# Patient Record
Sex: Male | Born: 1935 | Race: Black or African American | Hispanic: No | Marital: Married | Smoking: Former smoker
Health system: Southern US, Community
[De-identification: ages and names within clinical notes are randomized; demographics above are authoritative.]

## PROBLEM LIST (undated history)

## (undated) DIAGNOSIS — G959 Disease of spinal cord, unspecified: Secondary | ICD-10-CM

## (undated) DIAGNOSIS — R001 Bradycardia, unspecified: Secondary | ICD-10-CM

## (undated) DIAGNOSIS — R55 Syncope and collapse: Secondary | ICD-10-CM

## (undated) DIAGNOSIS — E785 Hyperlipidemia, unspecified: Secondary | ICD-10-CM

## (undated) DIAGNOSIS — D649 Anemia, unspecified: Secondary | ICD-10-CM

## (undated) DIAGNOSIS — D469 Myelodysplastic syndrome, unspecified: Secondary | ICD-10-CM

## (undated) DIAGNOSIS — E039 Hypothyroidism, unspecified: Secondary | ICD-10-CM

## (undated) DIAGNOSIS — G4733 Obstructive sleep apnea (adult) (pediatric): Secondary | ICD-10-CM

## (undated) DIAGNOSIS — J449 Chronic obstructive pulmonary disease, unspecified: Secondary | ICD-10-CM

## (undated) DIAGNOSIS — I4891 Unspecified atrial fibrillation: Secondary | ICD-10-CM

## (undated) DIAGNOSIS — F32A Depression, unspecified: Secondary | ICD-10-CM

## (undated) DIAGNOSIS — R011 Cardiac murmur, unspecified: Secondary | ICD-10-CM

## (undated) DIAGNOSIS — I2510 Atherosclerotic heart disease of native coronary artery without angina pectoris: Secondary | ICD-10-CM

## (undated) DIAGNOSIS — I639 Cerebral infarction, unspecified: Secondary | ICD-10-CM

## (undated) DIAGNOSIS — N189 Chronic kidney disease, unspecified: Secondary | ICD-10-CM

## (undated) DIAGNOSIS — F329 Major depressive disorder, single episode, unspecified: Secondary | ICD-10-CM

## (undated) DIAGNOSIS — I10 Essential (primary) hypertension: Secondary | ICD-10-CM

## (undated) HISTORY — PX: CARDIAC SURGERY: SHX584

## (undated) HISTORY — PX: BACK SURGERY: SHX140

## (undated) HISTORY — PX: NECK SURGERY: SHX720

## (undated) HISTORY — PX: CARDIAC VALVE REPLACEMENT: SHX585

## (undated) HISTORY — PX: ABDOMINAL AORTIC ANEURYSM REPAIR: SUR1152

---

## 2003-06-30 ENCOUNTER — Other Ambulatory Visit: Payer: Self-pay

## 2003-12-27 ENCOUNTER — Encounter: Payer: Self-pay | Admitting: Unknown Physician Specialty

## 2004-02-24 ENCOUNTER — Ambulatory Visit: Payer: Self-pay | Admitting: Internal Medicine

## 2004-10-19 ENCOUNTER — Ambulatory Visit: Payer: Self-pay | Admitting: Gastroenterology

## 2005-07-20 ENCOUNTER — Ambulatory Visit: Payer: Self-pay | Admitting: Internal Medicine

## 2005-08-04 ENCOUNTER — Ambulatory Visit: Payer: Self-pay | Admitting: Unknown Physician Specialty

## 2005-10-30 ENCOUNTER — Other Ambulatory Visit: Payer: Self-pay

## 2005-10-30 ENCOUNTER — Inpatient Hospital Stay: Payer: Self-pay | Admitting: Internal Medicine

## 2005-10-31 ENCOUNTER — Other Ambulatory Visit: Payer: Self-pay

## 2005-11-08 ENCOUNTER — Ambulatory Visit (HOSPITAL_COMMUNITY): Admission: RE | Admit: 2005-11-08 | Discharge: 2005-11-08 | Payer: Self-pay | Admitting: Neurosurgery

## 2006-01-11 ENCOUNTER — Ambulatory Visit (HOSPITAL_COMMUNITY): Admission: RE | Admit: 2006-01-11 | Discharge: 2006-01-11 | Payer: Self-pay | Admitting: Neurosurgery

## 2006-01-20 ENCOUNTER — Ambulatory Visit: Payer: Self-pay | Admitting: Internal Medicine

## 2006-01-23 ENCOUNTER — Ambulatory Visit: Payer: Self-pay | Admitting: Internal Medicine

## 2006-04-18 ENCOUNTER — Inpatient Hospital Stay (HOSPITAL_COMMUNITY): Admission: RE | Admit: 2006-04-18 | Discharge: 2006-04-24 | Payer: Self-pay | Admitting: Neurosurgery

## 2006-04-18 ENCOUNTER — Ambulatory Visit: Payer: Self-pay | Admitting: Cardiology

## 2006-06-15 ENCOUNTER — Ambulatory Visit: Payer: Self-pay | Admitting: Internal Medicine

## 2006-06-21 ENCOUNTER — Ambulatory Visit (HOSPITAL_COMMUNITY): Admission: RE | Admit: 2006-06-21 | Discharge: 2006-06-21 | Payer: Self-pay | Admitting: Neurosurgery

## 2006-07-05 ENCOUNTER — Ambulatory Visit: Payer: Self-pay | Admitting: Internal Medicine

## 2006-08-01 ENCOUNTER — Inpatient Hospital Stay (HOSPITAL_COMMUNITY): Admission: RE | Admit: 2006-08-01 | Discharge: 2006-08-11 | Payer: Self-pay | Admitting: Neurosurgery

## 2006-08-01 ENCOUNTER — Ambulatory Visit: Payer: Self-pay | Admitting: Cardiology

## 2006-12-05 ENCOUNTER — Ambulatory Visit (HOSPITAL_COMMUNITY): Admission: RE | Admit: 2006-12-05 | Discharge: 2006-12-05 | Payer: Self-pay | Admitting: Neurosurgery

## 2006-12-20 ENCOUNTER — Ambulatory Visit: Payer: Self-pay | Admitting: Vascular Surgery

## 2006-12-28 ENCOUNTER — Ambulatory Visit: Payer: Self-pay | Admitting: Internal Medicine

## 2007-06-22 ENCOUNTER — Ambulatory Visit: Payer: Self-pay | Admitting: Internal Medicine

## 2007-06-25 ENCOUNTER — Ambulatory Visit: Payer: Self-pay | Admitting: Internal Medicine

## 2007-07-03 ENCOUNTER — Ambulatory Visit: Payer: Self-pay | Admitting: Internal Medicine

## 2007-09-29 ENCOUNTER — Inpatient Hospital Stay: Payer: Self-pay | Admitting: Internal Medicine

## 2007-09-29 ENCOUNTER — Other Ambulatory Visit: Payer: Self-pay

## 2007-09-30 ENCOUNTER — Other Ambulatory Visit: Payer: Self-pay

## 2007-11-15 ENCOUNTER — Encounter: Admission: RE | Admit: 2007-11-15 | Discharge: 2007-11-15 | Payer: Self-pay | Admitting: Neurosurgery

## 2008-05-02 ENCOUNTER — Emergency Department: Payer: Self-pay | Admitting: Emergency Medicine

## 2009-03-04 ENCOUNTER — Ambulatory Visit: Payer: Self-pay | Admitting: Internal Medicine

## 2009-03-18 ENCOUNTER — Ambulatory Visit: Payer: Self-pay | Admitting: Internal Medicine

## 2009-04-27 ENCOUNTER — Ambulatory Visit: Payer: Self-pay | Admitting: Internal Medicine

## 2009-10-22 ENCOUNTER — Encounter: Payer: Self-pay | Admitting: Internal Medicine

## 2009-10-26 ENCOUNTER — Encounter: Payer: Self-pay | Admitting: Internal Medicine

## 2010-04-17 ENCOUNTER — Encounter: Payer: Self-pay | Admitting: Neurosurgery

## 2010-04-18 ENCOUNTER — Encounter: Payer: Self-pay | Admitting: Neurosurgery

## 2010-06-29 ENCOUNTER — Encounter: Payer: Self-pay | Admitting: Internal Medicine

## 2010-08-13 NOTE — Consult Note (Signed)
NAMEKEMAURI, MUSA NO.:  [id number]   MEDICAL RECORD NO.:  [id number]          PATIENT TYPE:  INP   LOCATION:  3302                         FACILITY:  [facility]   PHYSICIAN:  Jesse Sans. Wall, MD, FACCDATE OF BIRTH:  Dec 08, 1931   DATE OF CONSULTATION:  04/19/2006  DATE OF DISCHARGE:                                 CONSULTATION   I was asked by Dr. Trey Sailors to evaluate Rae Mar with bradycardia  into the 40s this evening.   HISTORY OF PRESENT ILLNESS:  Mr. Kloepfer is a 77 year old black male  with a history of complex cardiovascular and cardiac disease.   He underwent a lumbar decompression yesterday.  This evening, he was  noted to drop his heart rate down to the 40s which was asymptomatic.  His blood pressure at that time was 99/59.  O2 sats were 96, respiratory  rate was 21.  He was in no acute distress.   He has a history of chronic atrial fibrillation.  He was on  anticoagulation until a week prior to surgery.  He has had an extensive  evaluation at [facility] with Dr. Arnoldo Hooker and Dr. Corliss Parish.   His problem list includes:  1. History of mitral valve prolapse with moderate severe mitral      regurgitation, status post mitral valve repair at [facility] by Dr. Silvestre Mesi in 2003.  His last echocardiogram in      2007 showed moderate mitral regurgitation, biatrial enlargement,      moderate tricuspid regurgitation, ejection fraction around 55%.  2. Coronary artery disease with occluded mid-circumflex by      catheterization October 2007.  Ejection fraction 55%.  He has      nonobstructive disease in the LAD and no significant disease in the      right coronary artery.  3. Chronic atrial fibrillation.  4. Hypertension.  5. Severe COPD.   He is currently feeling fine and is alert and oriented.  His rates in  the 40s.  His electrocardiogram shows atrial fibrillation with a slow  rate.  There may be a mild left axis  deviation and early R-wave  progression across the anterior precordium.  He has not seen ST-segment  changes.  There is nothing acute.   PAST MEDICAL HISTORY:  In addition to the above, he has had five back  operations for disk problems.  Prior to this procedure, he had weakness  in his legs and could not even raise his right leg to drive a car.  He  also has a history of pneumonia in 2001.  He has a history of  depression.  He has a history of leukopenia with an extensive workup in  the past.  He has history of multiple pulmonary nodules followed by  regular CT scans.  Abdominal aortic aneurysm 3.5 x 4.3 in April 2002.  Underwent repair by Dr. Maye Hides at [facility] in 2004  and pulmonary hypertension from chronic lung disease and from his  cardiac disease.   His current medications on admission  were Combivent, Lipitor 20 mg p.o.  q.h.s., Lasix 40 mg a day, potassium 20 mEq a day, Cozaar 50 mg a day,  Advair, Allegra p.r.n., Vicodin p.r.n., aspirin daily unknown dose,  metoprolol 100 mg p.o. b.i.d. (Mr. Mccaffrey said this was recently  increased to this dose and was at 50 mg twice a day), Viagra p.r.n.,  Cardizem CD 120 daily and Coumadin 5/2.5 every other day (this has been  held).   He has no known drug allergies.   FAMILY HISTORY:  Is noncontributory.   SOCIAL HISTORY:  He quit smoking in 1985.  He denies any alcohol or  illicit drugs.  He is married and has three children-one of his sons is  here [employer].  He is retired.   REVIEW OF SYSTEMS:  Other than the HPI is noncontributory.   EXAM:  He is in no acute distress.  His blood pressure is 99/59, pulse  is 48-50 and irregular.  He has a narrow complex QRS.  Respiratory rate  is 21, O2 sats 96% on room air.  He is afebrile.  HEENT:  He has a grayish beard.  He is normocephalic, atraumatic.  PERRLA.  Extraocular movements intact.  Arcus senilis.  Conjunctivae  pink.  Sclerae are slightly muddy.  Facial  symmetry is normal.  Dentition is in need of repair.  Carotids upstrokes were equal  bilaterally without any obvious bruits.  There is no thyromegaly.  Trachea is midline.  LUNGS:  Reveal decreased breath sounds without rhonchi or wheezing.  There are no crackles or rales.  CARDIAC EXAM:  Reveals a slightly displaced PMI.  He has a soft S1-S2  with no gallop.  There is a soft systolic murmur at the apex.  There is  no right ventricular lift appreciated.  ABDOMINAL EXAM:  Soft, good bowel sounds.  He is moderately overweight.  There is no hepatomegaly.  EXTREMITIES:  Reveal no cyanosis, clubbing or edema.  Pulses 1+/4+  bilaterally symmetrical.  There is no DVT or evidence of  thrombophlebitis.  NEUROLOGIC:  Exam is grossly intact.  MUSCULOSKELETAL:  Shows chronic arthritic changes.  He is status post  back surgery.  SKIN:  Benign.   LABORATORY DATA:  Prior to surgery demonstrated a potassium of 4.4,  creatinine 1.2, glucose of 86.  Normal LFTs.  INR 1.1, PTT of 30.  Hemoglobin 13.7, platelets 172,000, white count 3.1 which is a chronic  issue.  Normal differential.  Normal urinalysis except for some slight  protein.   ASSESSMENT:  1. Asymptomatic bradycardia with a narrow QRS complex, with chronic      atrial fibrillation:  This is in the setting of having his      metoprolol increased recently to 100 b.i.d. and he is also on      diltiazem extended release 180 mg a day.  2. Chronic atrial fibrillation.  3. Anticoagulation on hold.  4. Status post mitral valve repair 2003 at [facility].  5. Normal left ventricular systolic function.  6. Moderate mitral regurgitation with moderate tricuspid      regurgitation.  7. Moderate pulmonary hypertension.  8. Coronary artery disease with a total occluded circumflex by cath      October 2007 with nonobstructive disease in the left anterior      descending artery and right coronary artery. 9. Hypertension.  10.History abdominal aortic aneurysm  repair in 2004 at [facility].   RECOMMENDATIONS:  1. Hold metoprolol this evening.  I will reassess in the morning and      make adjustments in his dosages as necessary.  2. Restart anticoagulation when surgically cleared by Dr. Channing Mutters.  3. Follow up with [facility] post discharge.  I will send      copies of these reports to Dr. Gwen Pounds and Dr. Dellis Filbert.  Thank you      very much for the consultation.      Thomas C. Daleen Squibb, MD, [facility]  Electronically Signed     TCW/MEDQ  D:  04/19/2006  T:  04/20/2006  Job:  [id number]   cc:   Payton Doughty, M.D.  [facility]  Arnoldo Hooker, M.D.  Corliss Parish, M.D.

## 2010-08-13 NOTE — Op Note (Signed)
[name], [name] NO.:  [id number]   MEDICAL RECORD NO.:  [id number]          PATIENT TYPE:  INP   LOCATION:  3302                         FACILITY:  [facility]   PHYSICIAN:  Payton Doughty, M.D.      DATE OF BIRTH:  [birth year]   DATE OF PROCEDURE:  04/18/2006  DATE OF DISCHARGE:                               OPERATIVE REPORT   PREOPERATIVE DIAGNOSIS:  Spinal stenosis, L2-3.   POSTOPERATIVE DIAGNOSIS:  Spinal stenosis, L2-3.   PROCEDURE:  L2-3 laminotomy and foraminotomy done bilaterally.   SURGEON:  Payton Doughty, M.D.   SERVICE:  Neurosurgery.   ANESTHESIA:  General endotracheal.   PREPARATION:  Prepped with sterile Betadine prep and scrubbed with  alcohol wipe.   COMPLICATIONS:  None.   NURSE ASSISTANT:  [place].   DOCTOR ASSISTANT:  Coletta Memos, M.D.   BODY OF TEXT:  This is a 77 year old with a severe spondylitic stenosis  at L2-3, taken to the operating room and smoothly anesthetized,  intubated and placed prone on the operating table.  Following shaving,  prepped and draped in the usual sterile fashion.  Skin was incised  directly over the lamina of L2.  The lamina of L2 was exposed in the  subperiosteal plane.  An intraoperative x-ray confirmed correctness of  the level and having confirmed the correctness of level, bilateral  laminotomy and foraminotomy were carried out to the top of the  ligamentum flavum and that was removed in a retrograde fashion.  Working  in the same plane, just extradurally, adhesions were lysed and the scar  removed at the L2-3 interspace where a prior decompressive laminectomy  had taken place several years ago by another doctor.  Once complete  decompression was achieved, the wound was irrigated and hemostasis  assured.  Neural foramina were carefully explored and found to be open.  Depo-Medrol-soaked fat was placed in the laminectomy defects.  The  successive layers of 0 Vicryl, 2-0 Vicryl and 3-0 nylon were used  to  close.  Betadine and Telfa dressing were applied and the patient  returned to the recovery room in good condition.           ______________________________  Payton Doughty, M.D.     MWR/MEDQ  D:  04/18/2006  T:  04/19/2006  Job:  [id number]

## 2010-08-13 NOTE — Discharge Summary (Signed)
NAMEYEHUDA, PRINTUP NO.:  [id number]   MEDICAL RECORD NO.:  [id number]          PATIENT TYPE:  INP   LOCATION:  3009                         FACILITY:  [facility]   PHYSICIAN:  Payton Doughty, M.D.      DATE OF BIRTH:  February 12, 1932   DATE OF ADMISSION:  04/18/2006  DATE OF DISCHARGE:  04/24/2006                               DISCHARGE SUMMARY   ADMITTING DIAGNOSIS:  Spondylosis and spinal stenosis in L2-3.   DISCHARGE DIAGNOSIS:  Spondylosis and spinal stenosis in L2-3.   OPERATION/PROCEDURE:  L2-3 bilateral laminotomy foraminotomy.   DICTATING DOCTOR:  Trey Sailors of neurosurgery.   COMPLICATIONS:  Bradycardia.   DISCHARGE STATUS:  Well.   BODY TEXT:  This is a 77 year old, right-handed, black gentleman whose  history and physical has been recounted in the chart.  He has had a  decompression laminectomy in the past and then developed progressive  increasing weakness in his legs.  Thoracic and lumbar MR is done, which  shows stenosis at L2-3.   MEDICAL HISTORY:  Remarkable for hypertension.   Some potassium, hydrocodone, Norvasc, Cozaar, amlodipine, Lasix,  fexofenadine and Toprol-XL.   GENERAL EXAM:  Intact.  NEUROLOGICALLY:  His strength was good until he got and walked for about  20 feet, then he had severe __________ complaints in his lower  extremities.   He was admitted after ascertaining normal laboratory values and  underwent laminotomy, foraminotomy bilateral at L2-3 and did well from a  neurosurgical standpoint.  Postoperatively, he became very bradycardic  and dropped his heart rate into the 30s and 40s.  He was visited by the  cardiologist who felt this was due to his beta blocker.  Once the beta  blocker was stopped, his heart rate came back up and he was able to  gradually be increased on his beta blocker.  He is now on metoprolol 75  mg twice a day.  Currently, he can walk with a walker.  His strength to  confrontational testing is full.  His  incision is dry and well healing.  He is actually doing well and once he is visited by cardiology, then he  will probably be able to go home.  His followup will be in the [facility] office in about a week for sutures and he will  have home health PT.           ______________________________  Payton Doughty, M.D.    MWR/MEDQ  D:  04/24/2006  T:  04/24/2006  Job:  [id number]

## 2010-08-13 NOTE — Consult Note (Signed)
Kyle Vasquez, Kyle Vasquez NO.:  [id number]   MEDICAL RECORD NO.:  [id number]          PATIENT TYPE:  INP   LOCATION:  3305                         FACILITY:  [facility]   PHYSICIAN:  Gerrit Friends. Dietrich Pates, MD, FACCDATE OF BIRTH:  06/08/1931   DATE OF CONSULTATION:  08/02/2006  DATE OF DISCHARGE:                                 CONSULTATION   REFERRING PHYSICIAN:  Dr. Channing Mutters.   PRIMARY CARDIOLOGIST:  Dr. Gwen Pounds and Dr. Daleen Squibb locally.   HISTORY OF PRESENT ILLNESS:  A 77 year old gentleman with a history of  mitral valve repair for mitral valve prolapse, single-vessel coronary  disease and chronic atrial arrhythmias referred for postoperative  management.  Kyle Vasquez was last seen in January of this year, also  followed in neurosurgery for chronic lumbosacral spinal issues.  At that  time, he was bradycardic.  With adjustment of his medications, that  issue resolved.  His cardiac history dates to at least 2004 when he  underwent mitral valve repair surgery at [facility] for mitral valve prolapse.  Subsequent echocardiograms are said to show moderate regurgitation;  however, he has not been symptomatic.  He last underwent coronary  angiography in October2007 at which time he was found to have total  occlusion of the circumflex coronary artery.  There was no significant  disease in the LAD or right.  He has chronic atrial arrhythmias for  which he is anticoagulated.  Warfarin was discontinued a week prior to  this surgery.  Diltiazem and metoprolol are utilized for rate control.   He has had a history of hypertension that has been well-controlled with  medical therapy.  He has peripheral vascular disease and underwent  abdominal aortic aneurysm repair surgery, also in 2004 at [facility].  He is  said to have pulmonary hypertension, presumably related to COPD, but  also perhaps to his previous mitral regurgitation.   PAST MEDICAL HISTORY:  Is otherwise notable for pulmonary nodules,  presumably benign, and an L2-3 laminectomy and fusion in January of this  year.   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  He resides in [place] with his wife.  He has 3 adult children.  He has  not used tobacco products since 1985 with prior consumption of less than  30 pack years.   MEDICATIONS:  Prior to admission included:  1. Diltiazem 180 mg daily.  2. Cozaar 100 mg daily.  3. Warfarin as previously mentioned.  4. Fexofenadine 180 mg daily.  5. Metoprolol 100 mg b.i.d..  6. Furosemide 40 mg daily.  7. Advair inhaler and albuterol by MDI.   He has no known drug allergies.   FAMILY HISTORY:  Mother died in his 37s due to heart problems.  Father  died in his 46s of causes unknown.   REVIEW OF SYSTEMS:  Is notable for a 10-pound weight loss over the last  few months, the need for corrective lenses for all vision, dentures,  occasional mild nasal discharge, pedal edema that resolves overnight,  and chronic back problems with pain and weakness in the lower  extremities.  He notes some constipation.  All other systems reviewed  and are negative.   On exam, pleasant gentleman who reports significant pain but does not  appear terribly uncomfortable.  The temperature is 100.5, heart rate 80  and irregular, respirations 20, blood pressure 90/60 by arterial line.  O2 saturation 91% on 2 liters nasal O2.  Urine output has been somewhat  decreased overnight with an average production of 30 cc/hour.  HEENT:  Bilateral arcus; EOMs full; normal lids and conjunctiva; normal  oral mucosa.  NECK:  No jugular venous distension; normal carotid upstrokes without  bruits.  ENDOCRINE:  No thyromegaly.  HEMATOPOIETIC:  No adenopathy.  SKIN:  No significant lesions.  LUNGS:  Clear.  CARDIAC:  Irregular rhythm; normal first and second heart sounds; modest  systolic murmur at the left sternal border.  ABDOMEN:  Soft and nontender; normal bowel sounds; no  organomegaly.  EXTREMITIES:  1/2+ ankle edema bilaterally; 1+ distal pulses.   EKG:  In most tracings, there appears to be an intra-atrial reentrant  rhythm with predominantly 2:1 and 3:1 AV block.  Heart rate is 95.  There is a leftward axis with nondiagnostic inferior Q-waves.   Laboratory otherwise notable for creatinine of 1.75 with initial  creatinine 1.05, a normal CBC and a normal INR on admission.   IMPRESSION:  Kyle Vasquez is doing fairly well postoperatively; however,  blood pressure is on the low side as is urine output and creatinine is  increased.  Metoprolol, diltiazem, and furosemide have been held, but  heart rate control remains adequate.  He has been given a 500 mL fluid  bolus without significant benefit.  We will change his IV fluids to  normal saline and increase the rate to 125 mL/hour.  Urine output and  renal function will be monitored.  A urine analysis is pending.   His relatively low blood pressure may be due to dehydration, but cardiac  causes will be excluded.  An echocardiogram will be obtained.  Serial  cardiac markers will be performed.   His atrial arrhythmias has said to be atrial fibrillation, but in some  tracings, it appears to be an intra-atrial reentrant rhythm (slow atrial  flutter).  His risk for thromboembolism is not terribly high.  The  warfarin can continue to be held until it is clearly safe to resume full  anticoagulation.  He probably will require heart rate control medication  over the next 24 hours.  If blood pressure recovers, he can be treated  with his usual medications.  If not, it may be worthwhile to try  digoxin.   We greatly appreciate the opportunity to resume care of this nice  gentleman and will be happy to follow him with you.      Gerrit Friends. Dietrich Pates, MD, [facility]  Electronically Signed    RMR/MEDQ  D:  08/02/2006  T:  08/02/2006  Job:  [id number]

## 2010-08-13 NOTE — H&P (Signed)
NAMESTACEY, MAURA NO.:  [id number]   MEDICAL RECORD NO.:  [id number]          PATIENT TYPE:  INP   LOCATION:  3302                         FACILITY:  [facility]   PHYSICIAN:  Payton Doughty, M.D.      DATE OF BIRTH:  [birth year]   DATE OF ADMISSION:  04/18/2006  DATE OF DISCHARGE:                              HISTORY & PHYSICAL   ADMISSION DIAGNOSIS:  Spondylosis and spinal stenosis at L2-3.   HISTORY OF PRESENT ILLNESS:  This is a very nice 77 year old right-  handed white gentleman who I operated on 14 years ago for cervical  myelopathy.  He had an anterior and posterior operation.  He had been  reasonably stable with the trial for 13 years.  Over the past couple of  years had increasing weakness in his lower extremities, unable to walk  very far, and getting up and down.  He had a decompressive laminectomy  by Dr. __________ ; he said it did not help him any in his lower  extremities.  He says when he stands up from bed his legs get weaker.  He notices no complaints of pain in his neck or his upper extremities.  Thoracic and lumbar MRIs were done, shows stenosis at L2-3, and he is  now admitted for decompressive laminotomy, foraminotomy.   PAST MEDICAL HISTORY:  Marked for hypertension.   MEDICATIONS:  1. He takes potassium 20 mg twice a day.  2. Hydrocodone 3 times a day.  3. Norvasc 5 mg a day.  4. Cozaar 100 mg a day.  5. Amlodipine 10 mg a day.  6. Lasix 40 mg a day.  7. Fexofenadine 100 mg a day.   ALLERGIES:  HE HAS NO ALLERGIES   PAST SURGICAL HISTORY:  1. A heart valve repair in 2004.  2. Abdominal aneurysm in 2004.  3. Lumbar decompression in 2005.   SOCIAL HISTORY:  Does not smoke or drink.  He is on disability.   REVIEW OF SYSTEMS:  Marked for glasses, shortness of breath, leg  weakness, back pain, leg pain, and arthritis.   PHYSICAL EXAMINATION:  HEENT:  Exam within normal limits.  NECK:  He actually has reasonable range of motion in the  neck.  CHEST:  Clear.  CARDIAC:  Regular rate and rhythm.  ABDOMEN:  Nontender.  No hepatosplenomegaly.  EXTREMITIES:  Without clubbing or cyanosis.  Peripheral pulses are good.  GU exam:  Deferred.  NEUROLOGICALLY:  He is awake, alert, and oriented.  His cranial nerves  are intact.  Motor exam:  Shows 5/5 strength throughout the upper  extremities.  Lower extremities:  Hip flexion about a 3/5 on the right,  4/5 on the left.  Knee extensors are 4 on the right, 5 on the left,  dorsi and plantar flexors are 4 on the right, 4+ on the left.  Reflexes  are 3 at the knees, 1 at the ankle, 2 at the left knee.  Ankle jerk is  flicker on the left.   The MRI results have been reviewed above.   CLINICAL IMPRESSION:  Neurogenic claudication secondary  to spinal  stenosis L2-3.   PLAN:  L2-3 laminotomy/foraminotomy.  The risks and benefits of this  approach have been discussed with him and he wishes to proceed.           ______________________________  Payton Doughty, M.D.     MWR/MEDQ  D:  04/18/2006  T:  04/19/2006  Job:  [id number]

## 2010-08-13 NOTE — H&P (Signed)
NAMERYATT, CORSINO NO.:  [id number]   MEDICAL RECORD NO.:  [id number]          PATIENT TYPE:  INP   LOCATION:  2899                         FACILITY:  [facility]   PHYSICIAN:  Payton Doughty, M.D.      DATE OF BIRTH:  [birth year]   DATE OF ADMISSION:  08/01/2006  DATE OF DISCHARGE:                              HISTORY & PHYSICAL   ADMISSION DIAGNOSIS:  Spondylosis L2-3.   SERVICE:  Neurosurgery.   A very nice now 77 year old right-handed black gentleman who has had  decompressive laminectomy at L2-3 about 3 months ago.  Did extremely  well with that and then had the precipitous onset of back and bilateral  lower extremity pain.  Re-study with an MR shows significant stenosis at  L2-3 based on facet arthropathy and a new disk, and he is now admitted  for decompression and fusion at that level.   MEDICAL HISTORY:  Remarkable for hypertension.  He takes potassium,  hydrocodone, Norvasc, Cozaar, amlodipine, Lasix, fexofenadine, and has  no allergies.   SURGICAL HISTORY:  There has been a heart valve repair in 1982,  abdominal aortic aneurysm in 2004, lumbar decompression in 2005, an  anterior decompression and posterior decompression of cervical spine in  1993.   SOCIAL HISTORY:  Does not smoke or drink, is on disability.   REVIEW OF SYSTEMS:  Remarkable for glasses, shortness of breath, leg  weakness, back pain, leg pain, arthritis.   PHYSICAL EXAMINATION:  HEENT:  Within normal limits.  He has reasonable  range of motion in his neck considering the fusion extends from C3-C7.  CHEST:  Clear.  CARDIAC:  Regular rate and rhythm at this time.  ABDOMEN:  Nontender with no hepatosplenomegaly.  EXTREMITIES:  Without clubbing or cyanosis.  GENITOURINARY:  Deferred.  PERIPHERAL PULSES:  Good.  NEUROLOGICALLY:  He is awake, alert and oriented.  His cranial nerves  are intact.  Motor exam shows 5/5 strength throughout the upper  extremities.  In lower extremities he has  hip flexor weakness, 3/5 on  the right, 4/5 in the left.  Knee extensors are 4/5 on the right, 5 on  the left.  Dorsi- and plantar flexors are 4 on the right, 4+ on the  left.  Reflexes are 3 at the knees and 1 at the ankle on the right, 2 at  the left knee.  Ankle jerks are flicker on the left.   The radiographic results have been reviewed above.   CLINICAL IMPRESSION:  Lumbar spondylosis resulting in significant spinal  stenosis.  The plan is for laminectomy, diskectomy, posterior lumbar  interbody fusion and pedicle screws at L2-3.  The risks and benefits of  this approach have been discussed with him and he wishes to proceed.           ______________________________  Payton Doughty, M.D.     MWR/MEDQ  D:  08/01/2006  T:  08/01/2006  Job:  [id number]

## 2010-08-13 NOTE — Op Note (Signed)
NAMERAMAN, FEATHERSTON NO.:  [id number]   MEDICAL RECORD NO.:  [id number]          PATIENT TYPE:  INP   LOCATION:  3305                         FACILITY:  [facility]   PHYSICIAN:  Payton Doughty, M.D.      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   ANESTHESIA:  General endotracheal anesthesia.   PREPARATION:  Betadine prep and alcohol wipe.   COMPLICATIONS:  None.   PROCEDURE:  L2-L3 laminectomy, facetectomy, discectomy, posterior lumbar  interbody fusion, nonsegmental pedicle screws, and posterolateral  arthrodesis.   SURGEON:  Payton Doughty, M.D.   BODY OF TEXT:  This is a 77 year old gentleman who had a decompression  at L2-L3 about five months ago and now has increased pain in both legs  and MRI evidence of disc and recurrent stenosis.  He was taken to the  operating room, smoothly anesthetized, and intubated, placed prone on  the operating table. Following shave, prep, and drape in the usual  sterile fashion, the skin was incised from mid L1 down past the bottom  of L2 and remaining lamina and transverse process of L2 and L3 were  bilaterally exposed in the subperiosteal plane.  Intraoperative x-ray  confirmed correctness of the level.  Having confirmed correctness of the  level, the pars interarticularis was located and found to be fractured  down into the facet joints on both sides.  It was fairly loose.  The  remaining pars was drilled into pieces that could be removed safely  without causing a dural injury.  This allowed exposure of the lateral  recess and the L2 root as it rounded the pedicle bilaterally as well as  the common dural tube and the L3  root as it rounded the pedicle.  These  were decompressed by bone removal and then discectomy was carried out.  Endplates were prepared and interbody devices were placed that had been  packed with BMP.  They were 12 mm and intraoperative x-ray showed good  placement.  Pedicle screws were then placed in L2 and L3 and the  transverse processes were decorticated with a high speed drill packed  with BMP.  The rods were placed across the screws, capped, and locked.  Intraoperative x-ray showed good placement of interbody devices, pedicle  screws and rods.  Final tightening was accomplished and successive  layers of 0 Vicryl, 2-0 Vicryl and 3-0 nylon were used to close.  Betadine and Telfa dressing was applied.  The patient returned to the  recovery room in good condition.           ______________________________  Payton Doughty, M.D.    MWR/MEDQ  D:  08/01/2006  T:  08/01/2006  Job:  [id number]

## 2010-08-13 NOTE — Discharge Summary (Signed)
NAMEDAMAREE, SARGENT NO.:  [id number]   MEDICAL RECORD NO.:  [id number]          PATIENT TYPE:  INP   LOCATION:  3004                         FACILITY:  [facility]   PHYSICIAN:  Payton Doughty, M.D.      DATE OF BIRTH:  April 22, 1931   DATE OF ADMISSION:  08/02/2006  DATE OF DISCHARGE:  08/11/2006                               DISCHARGE SUMMARY   ADMITTING DIAGNOSIS:  Recurrent spondylosis L2-3.   DISCHARGE DIAGNOSIS:  Recurrent spondylosis L2-3.   PROCEDURE:  L2-3 fusion.   OTHER CONSULTING SERVICES:  Cardiology.   BODY OF TEXT:  This 77 year old gentleman's history and physical is  recounted in the chart.  He had a decompression 2-3 three months ago,  did extremely well, and has had precipitous onset of back and bilateral  lower extremity pain.  MR showed significant stenosis at 2-3 and a new  herniated disk and he was admitted for fusion.   MEDICAL HISTORY:  Remarkable for hypertension, atrial fibrillation.   MEDICATIONS:  He is on Norvasc, Cozaar, potassium, amlodipine, Lasix,  fexofenadine.   SURGICAL HISTORY:  Heart valve repair in 1982, aneurysm 2004,  decompression 2005, and cervical spine operation in 1993.   EXAMINATION:  General exam was intact with irregularly irregular pulse.  Neurologic exam showed weakness in the knee extensors on the right, hip  flexors on the right and left and dorsi- and plantar flexors on the  right and left.   HOSPITAL COURSE:  He was admitted after ascertainment of normal  laboratory values and underwent 2-3 fusion.  Neurosurgically, from a  postoperative standpoint, he did relatively well.  He had difficulty  with a bit of hypotension and elevated renal markers for a couple of  days.  He was given a transfusion and volume expansion.  With that, he  developed a little bit of congestive heart failure which resolved with  diuresis.  He has had several bouts of shortness of breath and chest x-  ray shows a bit of interstitial  edema but today he is up and about on  room air, saturations in the mid 90s.  His incision is well-healing.  His strength is full to confrontational testing.  He has a bit of  trouble with weightbearing owing to the amount of time he has been off  his feet preoperatively as well as during his recovery.  He is currently  being discharged to skilled nursing facility in [facility] with the  exam as above and his vital signs are stable.   DISCHARGE MEDICATIONS INCLUDE:  1. Cozaar 100 mg a day.  2. Claritin 10 mg a day.  3. Advair one puff b.i.d.  4. Metoprolol 100 mg b.i.d.  5. Lyrica 75 mg every 12 hours.  6. Lasix 40 mg b.i.d.  7. Potassium chloride 20 mEq daily.  8. Coumadin 5 mg a day.  9. Cardizem 240 mg a day.  10.Zofran on a p.r.n. basis.  11.Albuterol puffer q.4 h. p.r.n.  12.Robaxin 500 mg every 6 p.r.n.  13.Labetalol on a p.r.n. basis.  14.Percocet one to two every 4 hours p.r.n.  15.Colace 100 mg __________ .  16.Milk of magnesia 30 mL at bedtime.   FOLLOW UP:  He is to call the [facility] Office for suture  removal in about a week.           ______________________________  Payton Doughty, M.D.     MWR/MEDQ  D:  08/11/2006  T:  08/11/2006  Job:  [id number]

## 2010-10-01 ENCOUNTER — Encounter: Payer: Self-pay | Admitting: Internal Medicine

## 2010-10-27 ENCOUNTER — Encounter: Payer: Self-pay | Admitting: Internal Medicine

## 2010-11-16 ENCOUNTER — Ambulatory Visit: Payer: Self-pay | Admitting: Internal Medicine

## 2010-11-27 ENCOUNTER — Encounter: Payer: Self-pay | Admitting: Internal Medicine

## 2010-12-27 ENCOUNTER — Encounter: Payer: Self-pay | Admitting: Internal Medicine

## 2011-01-27 ENCOUNTER — Encounter: Payer: Self-pay | Admitting: Internal Medicine

## 2011-08-10 ENCOUNTER — Ambulatory Visit: Payer: Self-pay | Admitting: Sports Medicine

## 2012-04-07 ENCOUNTER — Emergency Department: Payer: Self-pay | Admitting: Emergency Medicine

## 2012-04-07 LAB — CBC
HGB: 13.4 g/dL (ref 13.0–18.0)
MCH: 30.3 pg (ref 26.0–34.0)
WBC: 3.5 10*3/uL — ABNORMAL LOW (ref 3.8–10.6)

## 2012-04-07 LAB — BASIC METABOLIC PANEL
Calcium, Total: 9 mg/dL (ref 8.5–10.1)
Chloride: 113 mmol/L — ABNORMAL HIGH (ref 98–107)
Creatinine: 1.43 mg/dL — ABNORMAL HIGH (ref 0.60–1.30)
EGFR (Non-African Amer.): 47 — ABNORMAL LOW
Glucose: 92 mg/dL (ref 65–99)

## 2012-09-18 ENCOUNTER — Ambulatory Visit: Payer: Self-pay | Admitting: Internal Medicine

## 2013-03-28 ENCOUNTER — Ambulatory Visit: Payer: Self-pay | Admitting: Neurology

## 2013-03-28 ENCOUNTER — Observation Stay: Payer: Self-pay | Admitting: Internal Medicine

## 2013-03-28 LAB — LIPASE, BLOOD: Lipase: 98 U/L (ref 73–393)

## 2013-03-28 LAB — CK TOTAL AND CKMB (NOT AT ARMC)
CK, Total: 189 U/L (ref 35–232)
CK, Total: 200 U/L (ref 35–232)
CK, Total: 213 U/L (ref 35–232)
CK-MB: 2.1 ng/mL (ref 0.5–3.6)
CK-MB: 2.2 ng/mL (ref 0.5–3.6)
CK-MB: 2.6 ng/mL (ref 0.5–3.6)

## 2013-03-28 LAB — TROPONIN I
Troponin-I: <0.02 ng/mL
Troponin-I: <0.02 ng/mL
Troponin-I: <0.02 ng/mL

## 2013-03-28 LAB — COMPREHENSIVE METABOLIC PANEL
Albumin: 3.2 g/dL — ABNORMAL LOW (ref 3.4–5.0)
Alkaline Phosphatase: 79 U/L
Anion Gap: 5 — ABNORMAL LOW (ref 7–16)
BUN: 21 mg/dL — ABNORMAL HIGH (ref 7–18)
Bilirubin,Total: 0.3 mg/dL (ref 0.2–1.0)
Calcium, Total: 8.6 mg/dL (ref 8.5–10.1)
Chloride: 107 mmol/L (ref 98–107)
Co2: 28 mmol/L (ref 21–32)
Creatinine: 1.85 mg/dL — ABNORMAL HIGH (ref 0.60–1.30)
EGFR (African American): 39 — ABNORMAL LOW
EGFR (Non-African Amer.): 34 — ABNORMAL LOW
Glucose: 113 mg/dL — ABNORMAL HIGH (ref 65–99)
Osmolality: 283 (ref 275–301)
Potassium: 4.1 mmol/L (ref 3.5–5.1)
SGOT(AST): 18 U/L (ref 15–37)
SGPT (ALT): 16 U/L (ref 12–78)
Sodium: 140 mmol/L (ref 136–145)
Total Protein: 7 g/dL (ref 6.4–8.2)

## 2013-03-28 LAB — CBC
HCT: 37.1 % — ABNORMAL LOW (ref 40.0–52.0)
HGB: 12.5 g/dL — ABNORMAL LOW (ref 13.0–18.0)
MCH: 30.1 pg (ref 26.0–34.0)
MCHC: 33.7 g/dL (ref 32.0–36.0)
MCV: 90 fL (ref 80–100)
Platelet: 141 10*3/uL — ABNORMAL LOW (ref 150–440)
RBC: 4.15 10*6/uL — ABNORMAL LOW (ref 4.40–5.90)
RDW: 14.5 % (ref 11.5–14.5)
WBC: 3.4 10*3/uL — ABNORMAL LOW (ref 3.8–10.6)

## 2013-03-28 LAB — PROTIME-INR
INR: 2.7
Prothrombin Time: 28.1 secs — ABNORMAL HIGH (ref 11.5–14.7)

## 2013-03-29 LAB — CBC WITH DIFFERENTIAL/PLATELET
BASOS ABS: 0 10*3/uL (ref 0.0–0.1)
Basophil %: 0.7 %
Eosinophil #: 0 10*3/uL (ref 0.0–0.7)
Eosinophil %: 0.5 %
HCT: 33.5 % — ABNORMAL LOW (ref 40.0–52.0)
HGB: 11.3 g/dL — AB (ref 13.0–18.0)
LYMPHS PCT: 23.6 %
Lymphocyte #: 0.6 10*3/uL — ABNORMAL LOW (ref 1.0–3.6)
MCH: 29.8 pg (ref 26.0–34.0)
MCHC: 33.6 g/dL (ref 32.0–36.0)
MCV: 89 fL (ref 80–100)
MONO ABS: 0.4 x10 3/mm (ref 0.2–1.0)
Monocyte %: 15.3 %
NEUTROS ABS: 1.5 10*3/uL (ref 1.4–6.5)
NEUTROS PCT: 59.9 %
PLATELETS: 131 10*3/uL — AB (ref 150–440)
RBC: 3.78 10*6/uL — ABNORMAL LOW (ref 4.40–5.90)
RDW: 15 % — AB (ref 11.5–14.5)
WBC: 2.5 10*3/uL — AB (ref 3.8–10.6)

## 2013-03-29 LAB — BASIC METABOLIC PANEL
Anion Gap: 3 — ABNORMAL LOW (ref 7–16)
BUN: 18 mg/dL (ref 7–18)
CHLORIDE: 109 mmol/L — AB (ref 98–107)
CO2: 27 mmol/L (ref 21–32)
CREATININE: 1.45 mg/dL — AB (ref 0.60–1.30)
Calcium, Total: 8.6 mg/dL (ref 8.5–10.1)
EGFR (African American): 53 — ABNORMAL LOW
EGFR (Non-African Amer.): 45 — ABNORMAL LOW
GLUCOSE: 74 mg/dL (ref 65–99)
Osmolality: 278 (ref 275–301)
POTASSIUM: 4.2 mmol/L (ref 3.5–5.1)
Sodium: 139 mmol/L (ref 136–145)

## 2013-03-29 LAB — PROTIME-INR
INR: 2.8
Prothrombin Time: 28.5 secs — ABNORMAL HIGH (ref 11.5–14.7)

## 2013-03-29 LAB — MAGNESIUM: Magnesium: 2.2 mg/dL

## 2013-04-30 ENCOUNTER — Emergency Department: Payer: Self-pay | Admitting: Emergency Medicine

## 2013-04-30 LAB — CBC
HCT: 36.3 % — ABNORMAL LOW (ref 40.0–52.0)
HGB: 12.3 g/dL — ABNORMAL LOW (ref 13.0–18.0)
MCH: 30.6 pg (ref 26.0–34.0)
MCHC: 33.9 g/dL (ref 32.0–36.0)
MCV: 90 fL (ref 80–100)
PLATELETS: 133 10*3/uL — AB (ref 150–440)
RBC: 4.03 10*6/uL — ABNORMAL LOW (ref 4.40–5.90)
RDW: 14 % (ref 11.5–14.5)
WBC: 3.6 10*3/uL — AB (ref 3.8–10.6)

## 2013-04-30 LAB — COMPREHENSIVE METABOLIC PANEL
ALBUMIN: 3.2 g/dL — AB (ref 3.4–5.0)
Alkaline Phosphatase: 73 U/L
Anion Gap: 8 (ref 7–16)
BUN: 28 mg/dL — AB (ref 7–18)
Bilirubin,Total: 0.5 mg/dL (ref 0.2–1.0)
Calcium, Total: 8.7 mg/dL (ref 8.5–10.1)
Chloride: 106 mmol/L (ref 98–107)
Co2: 26 mmol/L (ref 21–32)
Creatinine: 1.99 mg/dL — ABNORMAL HIGH (ref 0.60–1.30)
EGFR (Non-African Amer.): 31 — ABNORMAL LOW
GFR CALC AF AMER: 36 — AB
GLUCOSE: 100 mg/dL — AB (ref 65–99)
Osmolality: 285 (ref 275–301)
Potassium: 4.2 mmol/L (ref 3.5–5.1)
SGOT(AST): 26 U/L (ref 15–37)
SGPT (ALT): 15 U/L (ref 12–78)
Sodium: 140 mmol/L (ref 136–145)
TOTAL PROTEIN: 7.2 g/dL (ref 6.4–8.2)

## 2013-04-30 LAB — PROTIME-INR
INR: 2.3
Prothrombin Time: 24.3 secs — ABNORMAL HIGH (ref 11.5–14.7)

## 2013-04-30 LAB — PRO B NATRIURETIC PEPTIDE: B-Type Natriuretic Peptide: 2886 pg/mL — ABNORMAL HIGH (ref 0–450)

## 2013-04-30 LAB — CK TOTAL AND CKMB (NOT AT ARMC)
CK, Total: 243 U/L
CK-MB: 2 ng/mL (ref 0.5–3.6)

## 2013-04-30 LAB — TROPONIN I: Troponin-I: <0.02 ng/mL

## 2013-05-07 ENCOUNTER — Ambulatory Visit: Payer: Self-pay | Admitting: Hematology and Oncology

## 2013-07-18 ENCOUNTER — Ambulatory Visit: Payer: Self-pay | Admitting: Cardiology

## 2013-07-18 LAB — PROTIME-INR
INR: 1.1
PROTHROMBIN TIME: 14.3 s (ref 11.5–14.7)

## 2013-10-22 ENCOUNTER — Encounter: Payer: Self-pay | Admitting: Internal Medicine

## 2013-10-26 ENCOUNTER — Encounter: Payer: Self-pay | Admitting: Internal Medicine

## 2013-12-05 ENCOUNTER — Emergency Department: Payer: Self-pay | Admitting: Student

## 2013-12-05 LAB — TROPONIN I

## 2013-12-05 LAB — URINALYSIS, COMPLETE
BILIRUBIN, UR: NEGATIVE
Glucose,UR: NEGATIVE mg/dL (ref 0–75)
KETONE: NEGATIVE
Leukocyte Esterase: NEGATIVE
Nitrite: NEGATIVE
Ph: 5 (ref 4.5–8.0)
SQUAMOUS EPITHELIAL: NONE SEEN
Specific Gravity: 1.013 (ref 1.003–1.030)
WBC UR: NONE SEEN /HPF (ref 0–5)

## 2013-12-05 LAB — CBC
HCT: 31.8 % — ABNORMAL LOW (ref 40.0–52.0)
HGB: 10.4 g/dL — ABNORMAL LOW (ref 13.0–18.0)
MCH: 30.3 pg (ref 26.0–34.0)
MCHC: 32.8 g/dL (ref 32.0–36.0)
MCV: 93 fL (ref 80–100)
Platelet: 140 10*3/uL — ABNORMAL LOW (ref 150–440)
RBC: 3.43 10*6/uL — AB (ref 4.40–5.90)
RDW: 14.9 % — AB (ref 11.5–14.5)
WBC: 2.8 10*3/uL — ABNORMAL LOW (ref 3.8–10.6)

## 2013-12-05 LAB — COMPREHENSIVE METABOLIC PANEL
ALBUMIN: 3.2 g/dL — AB (ref 3.4–5.0)
ALK PHOS: 69 U/L
ANION GAP: 5 — AB (ref 7–16)
AST: 24 U/L (ref 15–37)
BILIRUBIN TOTAL: 1 mg/dL (ref 0.2–1.0)
BUN: 32 mg/dL — AB (ref 7–18)
CREATININE: 2.13 mg/dL — AB (ref 0.60–1.30)
Calcium, Total: 8.8 mg/dL (ref 8.5–10.1)
Chloride: 109 mmol/L — ABNORMAL HIGH (ref 98–107)
Co2: 25 mmol/L (ref 21–32)
EGFR (African American): 33 — ABNORMAL LOW
EGFR (Non-African Amer.): 28 — ABNORMAL LOW
Glucose: 100 mg/dL — ABNORMAL HIGH (ref 65–99)
Osmolality: 285 (ref 275–301)
Potassium: 4.2 mmol/L (ref 3.5–5.1)
SGPT (ALT): 16 U/L
Sodium: 139 mmol/L (ref 136–145)
Total Protein: 6.9 g/dL (ref 6.4–8.2)

## 2013-12-05 LAB — PROTIME-INR
INR: 2.6
PROTHROMBIN TIME: 26.8 s — AB (ref 11.5–14.7)

## 2013-12-05 LAB — APTT: ACTIVATED PTT: 36.3 s — AB (ref 23.6–35.9)

## 2014-03-28 DIAGNOSIS — J449 Chronic obstructive pulmonary disease, unspecified: Secondary | ICD-10-CM | POA: Diagnosis not present

## 2014-03-28 DIAGNOSIS — R262 Difficulty in walking, not elsewhere classified: Secondary | ICD-10-CM | POA: Diagnosis not present

## 2014-03-28 DIAGNOSIS — I10 Essential (primary) hypertension: Secondary | ICD-10-CM | POA: Diagnosis not present

## 2014-03-29 DIAGNOSIS — I10 Essential (primary) hypertension: Secondary | ICD-10-CM | POA: Diagnosis not present

## 2014-03-31 DIAGNOSIS — I10 Essential (primary) hypertension: Secondary | ICD-10-CM | POA: Diagnosis not present

## 2014-04-01 DIAGNOSIS — I10 Essential (primary) hypertension: Secondary | ICD-10-CM | POA: Diagnosis not present

## 2014-04-02 DIAGNOSIS — I10 Essential (primary) hypertension: Secondary | ICD-10-CM | POA: Diagnosis not present

## 2014-04-03 DIAGNOSIS — I10 Essential (primary) hypertension: Secondary | ICD-10-CM | POA: Diagnosis not present

## 2014-04-04 DIAGNOSIS — I10 Essential (primary) hypertension: Secondary | ICD-10-CM | POA: Diagnosis not present

## 2014-04-05 DIAGNOSIS — I10 Essential (primary) hypertension: Secondary | ICD-10-CM | POA: Diagnosis not present

## 2014-04-07 DIAGNOSIS — I10 Essential (primary) hypertension: Secondary | ICD-10-CM | POA: Diagnosis not present

## 2014-04-08 DIAGNOSIS — I10 Essential (primary) hypertension: Secondary | ICD-10-CM | POA: Diagnosis not present

## 2014-04-09 DIAGNOSIS — I10 Essential (primary) hypertension: Secondary | ICD-10-CM | POA: Diagnosis not present

## 2014-04-10 DIAGNOSIS — I10 Essential (primary) hypertension: Secondary | ICD-10-CM | POA: Diagnosis not present

## 2014-04-11 DIAGNOSIS — I10 Essential (primary) hypertension: Secondary | ICD-10-CM | POA: Diagnosis not present

## 2014-04-12 DIAGNOSIS — I10 Essential (primary) hypertension: Secondary | ICD-10-CM | POA: Diagnosis not present

## 2014-04-14 DIAGNOSIS — I10 Essential (primary) hypertension: Secondary | ICD-10-CM | POA: Diagnosis not present

## 2014-04-15 DIAGNOSIS — S020XXA Fracture of vault of skull, initial encounter for closed fracture: Secondary | ICD-10-CM | POA: Diagnosis not present

## 2014-04-15 DIAGNOSIS — I739 Peripheral vascular disease, unspecified: Secondary | ICD-10-CM | POA: Diagnosis not present

## 2014-04-15 DIAGNOSIS — R32 Unspecified urinary incontinence: Secondary | ICD-10-CM | POA: Diagnosis not present

## 2014-04-15 DIAGNOSIS — I724 Aneurysm of artery of lower extremity: Secondary | ICD-10-CM | POA: Diagnosis not present

## 2014-04-15 DIAGNOSIS — I6529 Occlusion and stenosis of unspecified carotid artery: Secondary | ICD-10-CM | POA: Diagnosis not present

## 2014-04-15 DIAGNOSIS — I714 Abdominal aortic aneurysm, without rupture: Secondary | ICD-10-CM | POA: Diagnosis not present

## 2014-04-15 DIAGNOSIS — I499 Cardiac arrhythmia, unspecified: Secondary | ICD-10-CM | POA: Diagnosis not present

## 2014-04-15 DIAGNOSIS — I10 Essential (primary) hypertension: Secondary | ICD-10-CM | POA: Diagnosis not present

## 2014-04-16 DIAGNOSIS — I10 Essential (primary) hypertension: Secondary | ICD-10-CM | POA: Diagnosis not present

## 2014-04-17 DIAGNOSIS — J449 Chronic obstructive pulmonary disease, unspecified: Secondary | ICD-10-CM | POA: Diagnosis not present

## 2014-04-17 DIAGNOSIS — R262 Difficulty in walking, not elsewhere classified: Secondary | ICD-10-CM | POA: Diagnosis not present

## 2014-04-17 DIAGNOSIS — I10 Essential (primary) hypertension: Secondary | ICD-10-CM | POA: Diagnosis not present

## 2014-04-21 DIAGNOSIS — I10 Essential (primary) hypertension: Secondary | ICD-10-CM | POA: Diagnosis not present

## 2014-04-22 DIAGNOSIS — I10 Essential (primary) hypertension: Secondary | ICD-10-CM | POA: Diagnosis not present

## 2014-04-23 DIAGNOSIS — I10 Essential (primary) hypertension: Secondary | ICD-10-CM | POA: Diagnosis not present

## 2014-04-24 DIAGNOSIS — I10 Essential (primary) hypertension: Secondary | ICD-10-CM | POA: Diagnosis not present

## 2014-04-25 DIAGNOSIS — I10 Essential (primary) hypertension: Secondary | ICD-10-CM | POA: Diagnosis not present

## 2014-04-26 DIAGNOSIS — I10 Essential (primary) hypertension: Secondary | ICD-10-CM | POA: Diagnosis not present

## 2014-04-28 DIAGNOSIS — N183 Chronic kidney disease, stage 3 (moderate): Secondary | ICD-10-CM | POA: Diagnosis not present

## 2014-04-28 DIAGNOSIS — R262 Difficulty in walking, not elsewhere classified: Secondary | ICD-10-CM | POA: Diagnosis not present

## 2014-04-28 DIAGNOSIS — Z8673 Personal history of transient ischemic attack (TIA), and cerebral infarction without residual deficits: Secondary | ICD-10-CM | POA: Diagnosis not present

## 2014-04-28 DIAGNOSIS — J449 Chronic obstructive pulmonary disease, unspecified: Secondary | ICD-10-CM | POA: Diagnosis not present

## 2014-04-28 DIAGNOSIS — I10 Essential (primary) hypertension: Secondary | ICD-10-CM | POA: Diagnosis not present

## 2014-04-28 DIAGNOSIS — E782 Mixed hyperlipidemia: Secondary | ICD-10-CM | POA: Diagnosis not present

## 2014-04-29 DIAGNOSIS — I10 Essential (primary) hypertension: Secondary | ICD-10-CM | POA: Diagnosis not present

## 2014-04-30 DIAGNOSIS — I10 Essential (primary) hypertension: Secondary | ICD-10-CM | POA: Diagnosis not present

## 2014-05-01 DIAGNOSIS — I10 Essential (primary) hypertension: Secondary | ICD-10-CM | POA: Diagnosis not present

## 2014-05-02 DIAGNOSIS — I10 Essential (primary) hypertension: Secondary | ICD-10-CM | POA: Diagnosis not present

## 2014-05-03 DIAGNOSIS — I10 Essential (primary) hypertension: Secondary | ICD-10-CM | POA: Diagnosis not present

## 2014-05-05 DIAGNOSIS — I10 Essential (primary) hypertension: Secondary | ICD-10-CM | POA: Diagnosis not present

## 2014-05-06 DIAGNOSIS — I10 Essential (primary) hypertension: Secondary | ICD-10-CM | POA: Diagnosis not present

## 2014-05-07 DIAGNOSIS — I10 Essential (primary) hypertension: Secondary | ICD-10-CM | POA: Diagnosis not present

## 2014-05-08 DIAGNOSIS — I10 Essential (primary) hypertension: Secondary | ICD-10-CM | POA: Diagnosis not present

## 2014-05-09 DIAGNOSIS — I10 Essential (primary) hypertension: Secondary | ICD-10-CM | POA: Diagnosis not present

## 2014-05-10 DIAGNOSIS — I10 Essential (primary) hypertension: Secondary | ICD-10-CM | POA: Diagnosis not present

## 2014-05-12 DIAGNOSIS — I10 Essential (primary) hypertension: Secondary | ICD-10-CM | POA: Diagnosis not present

## 2014-05-13 DIAGNOSIS — Z0000 Encounter for general adult medical examination without abnormal findings: Secondary | ICD-10-CM | POA: Diagnosis not present

## 2014-05-13 DIAGNOSIS — M5416 Radiculopathy, lumbar region: Secondary | ICD-10-CM | POA: Diagnosis not present

## 2014-05-13 DIAGNOSIS — I10 Essential (primary) hypertension: Secondary | ICD-10-CM | POA: Diagnosis not present

## 2014-05-13 DIAGNOSIS — R0602 Shortness of breath: Secondary | ICD-10-CM | POA: Diagnosis not present

## 2014-05-13 DIAGNOSIS — J42 Unspecified chronic bronchitis: Secondary | ICD-10-CM | POA: Diagnosis not present

## 2014-05-14 DIAGNOSIS — S020XXA Fracture of vault of skull, initial encounter for closed fracture: Secondary | ICD-10-CM | POA: Diagnosis not present

## 2014-05-14 DIAGNOSIS — R32 Unspecified urinary incontinence: Secondary | ICD-10-CM | POA: Diagnosis not present

## 2014-05-14 DIAGNOSIS — I10 Essential (primary) hypertension: Secondary | ICD-10-CM | POA: Diagnosis not present

## 2014-05-15 DIAGNOSIS — I10 Essential (primary) hypertension: Secondary | ICD-10-CM | POA: Diagnosis not present

## 2014-05-16 DIAGNOSIS — I10 Essential (primary) hypertension: Secondary | ICD-10-CM | POA: Diagnosis not present

## 2014-05-17 DIAGNOSIS — I10 Essential (primary) hypertension: Secondary | ICD-10-CM | POA: Diagnosis not present

## 2014-05-18 DIAGNOSIS — R262 Difficulty in walking, not elsewhere classified: Secondary | ICD-10-CM | POA: Diagnosis not present

## 2014-05-18 DIAGNOSIS — J449 Chronic obstructive pulmonary disease, unspecified: Secondary | ICD-10-CM | POA: Diagnosis not present

## 2014-05-19 DIAGNOSIS — I10 Essential (primary) hypertension: Secondary | ICD-10-CM | POA: Diagnosis not present

## 2014-05-20 DIAGNOSIS — I10 Essential (primary) hypertension: Secondary | ICD-10-CM | POA: Diagnosis not present

## 2014-05-21 DIAGNOSIS — I10 Essential (primary) hypertension: Secondary | ICD-10-CM | POA: Diagnosis not present

## 2014-05-22 DIAGNOSIS — I10 Essential (primary) hypertension: Secondary | ICD-10-CM | POA: Diagnosis not present

## 2014-05-23 DIAGNOSIS — I10 Essential (primary) hypertension: Secondary | ICD-10-CM | POA: Diagnosis not present

## 2014-05-24 DIAGNOSIS — I10 Essential (primary) hypertension: Secondary | ICD-10-CM | POA: Diagnosis not present

## 2014-05-26 DIAGNOSIS — I10 Essential (primary) hypertension: Secondary | ICD-10-CM | POA: Diagnosis not present

## 2014-05-27 DIAGNOSIS — Z7901 Long term (current) use of anticoagulants: Secondary | ICD-10-CM | POA: Diagnosis not present

## 2014-05-27 DIAGNOSIS — R262 Difficulty in walking, not elsewhere classified: Secondary | ICD-10-CM | POA: Diagnosis not present

## 2014-05-27 DIAGNOSIS — J449 Chronic obstructive pulmonary disease, unspecified: Secondary | ICD-10-CM | POA: Diagnosis not present

## 2014-05-27 DIAGNOSIS — I10 Essential (primary) hypertension: Secondary | ICD-10-CM | POA: Diagnosis not present

## 2014-05-28 DIAGNOSIS — I10 Essential (primary) hypertension: Secondary | ICD-10-CM | POA: Diagnosis not present

## 2014-05-29 DIAGNOSIS — I10 Essential (primary) hypertension: Secondary | ICD-10-CM | POA: Diagnosis not present

## 2014-05-30 DIAGNOSIS — I10 Essential (primary) hypertension: Secondary | ICD-10-CM | POA: Diagnosis not present

## 2014-05-31 DIAGNOSIS — I10 Essential (primary) hypertension: Secondary | ICD-10-CM | POA: Diagnosis not present

## 2014-06-02 DIAGNOSIS — I10 Essential (primary) hypertension: Secondary | ICD-10-CM | POA: Diagnosis not present

## 2014-06-03 DIAGNOSIS — I10 Essential (primary) hypertension: Secondary | ICD-10-CM | POA: Diagnosis not present

## 2014-06-04 DIAGNOSIS — I10 Essential (primary) hypertension: Secondary | ICD-10-CM | POA: Diagnosis not present

## 2014-06-05 DIAGNOSIS — I10 Essential (primary) hypertension: Secondary | ICD-10-CM | POA: Diagnosis not present

## 2014-06-06 DIAGNOSIS — I10 Essential (primary) hypertension: Secondary | ICD-10-CM | POA: Diagnosis not present

## 2014-06-07 DIAGNOSIS — I10 Essential (primary) hypertension: Secondary | ICD-10-CM | POA: Diagnosis not present

## 2014-06-09 DIAGNOSIS — I10 Essential (primary) hypertension: Secondary | ICD-10-CM | POA: Diagnosis not present

## 2014-06-10 DIAGNOSIS — Z7901 Long term (current) use of anticoagulants: Secondary | ICD-10-CM | POA: Diagnosis not present

## 2014-06-10 DIAGNOSIS — I10 Essential (primary) hypertension: Secondary | ICD-10-CM | POA: Diagnosis not present

## 2014-06-11 DIAGNOSIS — I10 Essential (primary) hypertension: Secondary | ICD-10-CM | POA: Diagnosis not present

## 2014-06-12 DIAGNOSIS — S020XXA Fracture of vault of skull, initial encounter for closed fracture: Secondary | ICD-10-CM | POA: Diagnosis not present

## 2014-06-12 DIAGNOSIS — R32 Unspecified urinary incontinence: Secondary | ICD-10-CM | POA: Diagnosis not present

## 2014-06-12 DIAGNOSIS — I10 Essential (primary) hypertension: Secondary | ICD-10-CM | POA: Diagnosis not present

## 2014-06-13 DIAGNOSIS — I10 Essential (primary) hypertension: Secondary | ICD-10-CM | POA: Diagnosis not present

## 2014-06-14 DIAGNOSIS — I10 Essential (primary) hypertension: Secondary | ICD-10-CM | POA: Diagnosis not present

## 2014-06-16 DIAGNOSIS — J449 Chronic obstructive pulmonary disease, unspecified: Secondary | ICD-10-CM | POA: Diagnosis not present

## 2014-06-16 DIAGNOSIS — R262 Difficulty in walking, not elsewhere classified: Secondary | ICD-10-CM | POA: Diagnosis not present

## 2014-06-16 DIAGNOSIS — I10 Essential (primary) hypertension: Secondary | ICD-10-CM | POA: Diagnosis not present

## 2014-06-17 DIAGNOSIS — I10 Essential (primary) hypertension: Secondary | ICD-10-CM | POA: Diagnosis not present

## 2014-06-18 DIAGNOSIS — I10 Essential (primary) hypertension: Secondary | ICD-10-CM | POA: Diagnosis not present

## 2014-06-19 DIAGNOSIS — I10 Essential (primary) hypertension: Secondary | ICD-10-CM | POA: Diagnosis not present

## 2014-06-20 DIAGNOSIS — I10 Essential (primary) hypertension: Secondary | ICD-10-CM | POA: Diagnosis not present

## 2014-06-21 DIAGNOSIS — I10 Essential (primary) hypertension: Secondary | ICD-10-CM | POA: Diagnosis not present

## 2014-06-23 DIAGNOSIS — I10 Essential (primary) hypertension: Secondary | ICD-10-CM | POA: Diagnosis not present

## 2014-06-24 DIAGNOSIS — I10 Essential (primary) hypertension: Secondary | ICD-10-CM | POA: Diagnosis not present

## 2014-06-24 DIAGNOSIS — Z7901 Long term (current) use of anticoagulants: Secondary | ICD-10-CM | POA: Diagnosis not present

## 2014-06-25 DIAGNOSIS — I10 Essential (primary) hypertension: Secondary | ICD-10-CM | POA: Diagnosis not present

## 2014-06-26 DIAGNOSIS — I10 Essential (primary) hypertension: Secondary | ICD-10-CM | POA: Diagnosis not present

## 2014-06-27 DIAGNOSIS — I10 Essential (primary) hypertension: Secondary | ICD-10-CM | POA: Diagnosis not present

## 2014-06-27 DIAGNOSIS — J449 Chronic obstructive pulmonary disease, unspecified: Secondary | ICD-10-CM | POA: Diagnosis not present

## 2014-06-27 DIAGNOSIS — R262 Difficulty in walking, not elsewhere classified: Secondary | ICD-10-CM | POA: Diagnosis not present

## 2014-06-28 DIAGNOSIS — I10 Essential (primary) hypertension: Secondary | ICD-10-CM | POA: Diagnosis not present

## 2014-06-30 DIAGNOSIS — I10 Essential (primary) hypertension: Secondary | ICD-10-CM | POA: Diagnosis not present

## 2014-07-01 DIAGNOSIS — I10 Essential (primary) hypertension: Secondary | ICD-10-CM | POA: Diagnosis not present

## 2014-07-02 DIAGNOSIS — I10 Essential (primary) hypertension: Secondary | ICD-10-CM | POA: Diagnosis not present

## 2014-07-03 DIAGNOSIS — I10 Essential (primary) hypertension: Secondary | ICD-10-CM | POA: Diagnosis not present

## 2014-07-04 DIAGNOSIS — I10 Essential (primary) hypertension: Secondary | ICD-10-CM | POA: Diagnosis not present

## 2014-07-05 DIAGNOSIS — I10 Essential (primary) hypertension: Secondary | ICD-10-CM | POA: Diagnosis not present

## 2014-07-07 DIAGNOSIS — I10 Essential (primary) hypertension: Secondary | ICD-10-CM | POA: Diagnosis not present

## 2014-07-08 DIAGNOSIS — I10 Essential (primary) hypertension: Secondary | ICD-10-CM | POA: Diagnosis not present

## 2014-07-09 DIAGNOSIS — I10 Essential (primary) hypertension: Secondary | ICD-10-CM | POA: Diagnosis not present

## 2014-07-10 DIAGNOSIS — I10 Essential (primary) hypertension: Secondary | ICD-10-CM | POA: Diagnosis not present

## 2014-07-11 DIAGNOSIS — I10 Essential (primary) hypertension: Secondary | ICD-10-CM | POA: Diagnosis not present

## 2014-07-12 DIAGNOSIS — I10 Essential (primary) hypertension: Secondary | ICD-10-CM | POA: Diagnosis not present

## 2014-07-14 DIAGNOSIS — I10 Essential (primary) hypertension: Secondary | ICD-10-CM | POA: Diagnosis not present

## 2014-07-15 DIAGNOSIS — S020XXA Fracture of vault of skull, initial encounter for closed fracture: Secondary | ICD-10-CM | POA: Diagnosis not present

## 2014-07-15 DIAGNOSIS — I10 Essential (primary) hypertension: Secondary | ICD-10-CM | POA: Diagnosis not present

## 2014-07-15 DIAGNOSIS — R32 Unspecified urinary incontinence: Secondary | ICD-10-CM | POA: Diagnosis not present

## 2014-07-16 DIAGNOSIS — I10 Essential (primary) hypertension: Secondary | ICD-10-CM | POA: Diagnosis not present

## 2014-07-17 DIAGNOSIS — J449 Chronic obstructive pulmonary disease, unspecified: Secondary | ICD-10-CM | POA: Diagnosis not present

## 2014-07-17 DIAGNOSIS — R262 Difficulty in walking, not elsewhere classified: Secondary | ICD-10-CM | POA: Diagnosis not present

## 2014-07-17 DIAGNOSIS — I10 Essential (primary) hypertension: Secondary | ICD-10-CM | POA: Diagnosis not present

## 2014-07-18 DIAGNOSIS — I10 Essential (primary) hypertension: Secondary | ICD-10-CM | POA: Diagnosis not present

## 2014-07-19 DIAGNOSIS — I10 Essential (primary) hypertension: Secondary | ICD-10-CM | POA: Diagnosis not present

## 2014-07-19 NOTE — Consult Note (Signed)
PATIENT NAME:  Kyle Vasquez, Kyle Vasquez MR#:  [id number] DATE OF BIRTH:  1931/12/28  DATE OF CONSULTATION:  03/28/2013  CONSULTING PHYSICIAN:  Leotis Pain, MD  REASON FOR CONSULTATION: Syncope, abnormal CAT scan.  HISTORY OF PRESENT ILLNESS: A 81 year old gentleman with past medical history of hypertension, atrial fibrillation, on Coumadin, with therapeutic INR on admission, coronary artery disease, recent discharge from [facility] for stroke, which appears to be a right PCA stroke on the CAT scan, history of COPD and valvular disease, presenting with a syncopal episode. The patient could not provide history, so all information was obtained from chart. The patient was eating in a church and had sudden loss of consciousness. He was slumped over, and there was a prolonged period of loss of consciousness. No seizure-like activity, no tongue biting, no urinary incontinence. When EMS arrived, the patient was found to be bradycardic, with heart rate in the 40s, which lasted about 2 minutes. The patient currently believed back to his baseline, status post CT of the head that showed right PCA infarct and dilated ventricles. Cannot compare to any previous CAT scan. There is suspicion of normal-pressure hydrocephalus.   PAST MEDICAL HISTORY:  1. Hypertension.  2. Anemia.  3. Atrial fibrillation.  4. Coronary artery disease.  5. Hyperlipidemia.  6. COPD.  7. Valvular heart disease.  8. Peripheral neuropathy.  9. Myelopathy.  10. Recent stroke that appears to be a right PCA stroke with left-sided weakness.   FAMILY HISTORY: Multiple siblings with history of stroke.   SOCIAL: The patient lives at home. No smoking, no drug use.   HOME MEDICATION: Includes Coumadin, losartan, Advair, albuterol, Flomax, Crestor, Norvasc, Lasix, Synthroid, vitamin B complex.   ALLERGIES: NO KNOWN DRUG ALLERGIES.   REVIEW OF SYSTEMS:  CONSTITUTIONAL: Denies any fever, chills, weight gain, weight loss or fatigue.  EYES: Denies any  blurry vision, double vision.  EARS, NOSE AND THROAT: Denies any tinnitus, ear pain, hearing loss.  RESPIRATORY: Denies cough, wheezing.  GENITOURINARY: Denies dysuria, hematuria or renal colic.  GASTROINTESTINAL: Denies any nausea, vomiting.  SKIN: Denies any rash.  PSYCHIATRIC: Denies anxiety and insomnia.  NEUROLOGICAL: Past history of stroke.   IMAGING: CAT scan as described above. Ultrasound of carotids: No hemodynamic stenosis.   LABORATORY WORKUP: Reviewed.   PHYSICAL EXAMINATION: VITAL SIGNS: Includes a temperature of 97.8, pulse 83, respirations 18. No signs of orthostatic hypotension, with blood pressure 133/83 while he is lying and 131/71 while he is standing.  NEUROLOGIC: Memory appears to be severely impaired, short-term memory specifically, but he was able to tell me how many quarters are in $1.50. Could not tell me the date, time and the name of the hospital. Visual fields appear to be intact bilaterally. Pupils 3 mm to 2 mm, reactive bilaterally. Facial sensation intact. Facial motor is intact. Motor strength: There is left upper extremity drift, which is 5- out of 5. The rest of the body is 5 out of 5. Sensation appears to be intact to light touch. Reflexes 1+, diminished throughout. Gait not assessed, but the patient does state he uses walker to ambulate at home. No history of urinary incontinence.   IMPRESSION: A 81 year old gentleman with past medical history of hypertension, atrial fibrillation, on Coumadin, coronary artery disease, hyperlipidemia, recent right posterior cerebral artery stroke, chronic obstructive pulmonary disease and valvular disease, presents with a syncopal episode and was found to have an abnormal MRI which shows the stroke as well as suspicion of normal-pressure hydrocephalus due to dilated ventricles. Normal-pressure hydrocephalus does  present with memory impairment, magnetic gait, which this patient possibly has, and episodes of urinary incontinence. It  is very possible that he does have normal-pressure hydrocephalus, but normal-pressure hydrocephalus should be worked up as an outpatient, which needs a large volume lumbar puncture, followed by neurological testing such as memory testing that is done by neuropsychiatrist testing as outpatient. If the patient does have normal-pressure hydrocephalus, the patient will need followup with neurosurgery for possible shunt revision. Again, this can be all done as outpatient, and I would not do any normal-pressure hydrocephalus workup while in the hospital. The patient has worsening renal function that is going to be treated by his primary team. Continue anticoagulation for atrial fibrillation. At this point, would not do any further imaging from a neurological standpoint.   Thank you. It was a pleasure seeing this patient. Please call with any questions   ____________________________ Leotis Pain, MD yz:lb D: 03/28/2013 12:08:22 ET T: 03/28/2013 12:27:59 ET JOB#: [id number]  cc: Leotis Pain, MD, <Dictator> Leotis Pain MD ELECTRONICALLY SIGNED 04/15/2013 17:15

## 2014-07-19 NOTE — H&P (Signed)
PATIENT NAME:  Kyle Vasquez, Kyle Vasquez MR#:  [id number] DATE OF BIRTH:  10-09-1931  DATE OF ADMISSION:  03/28/2013  REFERRING PHYSICIAN: Dr. Lurline Hare.   PRIMARY CARE PHYSICIAN:  PRIMARY CARDIOLOGIST: Dr. Nehemiah Massed.   PRIMARY NEUROLOGY:  Dr. Jennings Books.   CHIEF COMPLAINT: Syncope.   HISTORY OF PRESENT ILLNESS: This is a 81 year old male with significant past medical history of hypertension, A. fib on warfarin, coronary artery disease, hyperlipidemia, recent diagnosis of CVA with left-sided weakness at [facility], COPD, valvular heart disease. The patient presents with syncope. The patient's wife and son at bedside, gives most of the history. The patient was at the church where he was eating, and all of a sudden he had episode of loss of consciousness where he slumped on the table in front of him, there was no fall. The patient remained unconscious for around 15 minutes.  No seizure activity noticed. No tongue biting, no urine or stool incontinence. When EMS presented, they noticed the patient to have a couple of episodes of bradycardia with a heart rate in the low 40s, lasted for 2 minutes. Prior to  presentation to ED, the patient was back to his baseline, had no complaints. Has no recall of that episode. The patient's blood work was significant for worsening creatinine with creatinine at 1.85 where it is at baseline at 1.4, and his INR was subtherapeutic at 2.7. The patient was afebrile  and heart rate was in the 80s and no recurrence of bradycardia while in ED and still awaiting for orthostatics. The patient had CT head without contrast which did show diffuse atrophic, necrotic and chronic ischemic changes and normal pressure hydrocephalus should be excluded, as well acute or subacute infarct changes as well were noticed, but as well this, as mentioned earlier, family reported the patient recently had an episode of CVA, so this CT finding is unclear if it is related to the stroke at [facility] or not. Hospitalist service  was requested to admit the patient for further evaluation.   PAST MEDICAL HISTORY: 1.  Hypertension.  2.  Anemia.  3.  Atrial fibrillation. 4.  Coronary artery disease.  5.  Hyperlipidemia.  6.  COPD.   7.  Valvular heart disease.  8.  Peripheral neuropathy.  9.  Myelopathy.  10.   Recent CVA with resultant left-side weakness   PAST SURGICAL HISTORY: Back surgery x 3.   FAMILY HISTORY: Significant for sister having CVA.   SOCIAL HISTORY: The patient lives at home. No current smoke or alcohol or illicit drug use.   HOME MEDICATIONS: 1.  Warfarin 5 mg every Monday, Wednesday, Friday and Saturday, 7.5 mg every Tuesday and Thursday.  2.  Losartan 100 mg oral daily.  3.  Advair Diskus 250/50 1 puff b.i.d.  4.  Albuterol as needed.  5.  Flomax 0.4 mg oral daily.  6.  Crestor 5 mg at bedtime.  7.  Norvasc 10 mg daily.  8.  Lasix 20 mg 1/2 tablet 2 times a day.  9.  Levothyroxine 25 mcg oral daily.  10.  Cyanocobalamin 500 mcg oral twice a day.   ALLERGIES: No known drug allergies.   REVIEW OF SYSTEMS:  CONSTITUTIONAL: The patient denies fever, chills, weight gain, weight loss, fatigue or weakness.  EYES: Denies blurry vision, double vision, inflammation, glaucoma.  EARS, NOSE, THROAT: Denies tinnitus, ear pain, hearing loss, epistaxis or discharge. RESPIRATORY: Denies cough, wheezing, hemoptysis, COPD.  CARDIOVASCULAR: Denies chest pain, orthopnea, edema, palpitation, reports an episode of syncope.  GENITOURINARY: Denies dysuria, hematuria, renal colic.  GASTROINTESTINAL: Denies nausea, vomiting, diarrhea, abdominal pain, hematemesis. ENDOCRINE: Denies polyuria, polydipsia, heat or cold intolerance.  HEMATOLOGY: Denies anemia, easy bruising, bleeding diathesis. INTEGUMENTARY: Denies acne, rash or skin lesion.  MUSCULOSKELETAL: Denies any cramps, gout. Reports limited activity, currently on ambulates with wheelchair, sometimes able to walk with a walker in the presence of  physical therapy.  NEUROLOGICAL: Reports history of recent history of CVA with left-sided weakness. Denies any seizures, headache, tremors.  PSYCHIATRIC: Denies anxiety, insomnia or schizophrenia.   PHYSICAL EXAMINATION: VITAL SIGNS: Temperature 97.6, pulse 86, respiratory rate 18, blood pressure 135/86, saturating 96% on room air.  GENERAL: Frail, elderly male, lies in bed, in no apparent distress.  HEENT: Head atraumatic, normocephalic. Pupils equal, reactive to light. Pink conjunctivae. Anicteric sclerae. Dry oral mucosa and poor dentition.  NECK: Supple. No thyromegaly. No JVD. No carotid bruits.  CHEST: Good air entry bilaterally. No wheezing, rales or rhonchi.  CARDIOVASCULAR: S1, S2 heard. No rubs, gallops.  Regular rate and rhythm.  ABDOMEN: Soft, nontender, nondistended. Bowel sounds present.   EXTREMITIES: +1 edema. No clubbing. No cyanosis. Pedal pulses felt bilaterally.  PSYCHIATRIC: Appropriate affect. Awake, alert x 2. Intact judgment and insight. NEUROLOGIC: Cranial nerves grossly intact. The patient is generally weak. Right upper and lower extremity, 4 to 5 out of 5 motor strength, left upper and lower appears to be 4 out of 5.  LYMPHATIC: No cervical lymphadenopathy.  SKIN: Normal skin turgor. Warm and dry.   PERTINENT LABORATORY DATA: Glucose 113, BUN 21, creatinine 1.85, sodium 140, potassium 4.1, chloride 107, CO2 28, lipase 98. ALT 16, AST 18, alkaline phosphatase 79. Troponin is less than 0.02. White blood cells 3.4, hemoglobin 12.5, hematocrit 37.1, platelets 141, INR 2.7.   IMAGING: CT head without contrast showing diffuse atrophy and small vessel ischemic changes and ventricular dilatation, normal pressure hydrocephalus should be excluded, acute or subacute infarct changes in the distribution of the right posterior cerebral artery,  no acute intracranial hemorrhage.   ASSESSMENT AND PLAN: 1.  Syncope. The patient will be admitted to telemetry unit. Given the fact he  had an episode of bradycardia, the patient's medication were reviewed, none of them would contribute to his bradycardia, consult cardiology for further evaluation of his bradycardia, we will continue to cycle his cardiac enzymes and follow the trends. We will check orthostatic blood pressures and will check a repeat Doppler as well. We will check urinalysis, and will  hydrate the patient, and given the fact of abnormal CT head finding, is unclear if acute/subacute infarct, if this is something recent or if this is something related to his recent cerebrovascular accident at [facility], so we will request the imaging and medical records from [facility]. We will consult neurology to evaluate the patient, especially with the findings which do suggest presence of normal pressure hydrocephalus. but at this point there is no clinical evidance of acute CVA. 2.  Acute renal failure. We will hold losartan, we will continue with fluids.  3. Hypertension: Blood pressure is acceptable. We will hold home meds until  the patient's orthostatics are checked.  4. Atrial fibrillation, currently rate controlled with therapeutic INR. Will consult pharmacy to dose his warfarin.  5.  Coronary artery disease: The patient has no complaints of chest pain.  6.  Chronic obstructive pulmonary disease, has no wheezing. Will continue on Advair and as needed nebulizer.  7.  Hypothyroidism. Continue with Synthroid.  8.  Hyperlipidemia. Continue with statin.  9.  Deep vein thrombosis prophylaxis. The patient is on full dose anticoagulation with warfarin with therapeutic INR.  10.  CODE STATUS: Discussed with the patient and family at bedside. They report he has no LIVING WILL, no health care power of attorney currently and he wishes to remain a full code.   Total time spent on admission and patient care: 55 minutes.    ____________________________ Albertine Patricia, MD dse:NTS D: 03/28/2013 02:39:34 ET T: 03/28/2013 03:11:14  ET JOB#: [id number]  cc: Albertine Patricia, MD, <Dictator> Keirstan Iannello Graciela Husbands MD ELECTRONICALLY SIGNED 03/29/2013 2:38

## 2014-07-19 NOTE — Consult Note (Signed)
Brief Consult Note: Diagnosis: Pt with history of chronic afib treated with rate control and chronic anticoagulation with warfarin and recent R PCA  cva. Admitted after syncope after becoming nauseated.   Patient was seen by consultant.   Recommend further assessment or treatment.   Discussed with Attending MD.   Comments: 81 yo male with chronic afib treated with chronic anticoagulation and rate control with recent cva treated at [facility] who was admitted after suffering a syncopal episode. He was eating at a church dinner when he statd he becam nauseated followed by a syncopal episode. Initial assessment noted him to be bradycardic. EKG and telemetry since admission reveals afib with variable ventricular response. No signficant bradycardia noted. No pauses. Head ct did not reveal any new problems. Etiology of bradycardia and syncope is likely vagal in nature. INR was therapeutic. Would continue to anticoagulate and ambulate and consier discharge if stable. Would place a 24 hour holter at discharge and will follow up as outpatient. Echo was unremarkable.  Electronic Signatures: Teodoro Spray (MD)  (Signed 01-Jan-15 13:12)  Authored: Brief Consult Note   Last Updated: 01-Jan-15 13:12 by Teodoro Spray (MD)

## 2014-07-19 NOTE — Discharge Summary (Signed)
PATIENT NAME:  Kyle Vasquez, Kyle Vasquez MR#:  [id number] DATE OF BIRTH:  1932-01-08  DATE OF ADMISSION:  03/28/2013 DATE OF DISCHARGE:  03/29/2013  DIAGNOSES AT TIME OF DISCHARGE:  1.  Syncope, possibly related to vasovagal episode.  2.  Acute renal failure secondary to dehydration.  3.  Hypertension.  4.  A history of atrial fibrillation. 5.  Coronary artery disease.  6.  Chronic obstructive pulmonary disease.  7.  Hypothyroidism.  8.  Hyperlipidemia.  9.  Myelopathy with bilateral lower extremity weakness.   CHIEF COMPLAINT:  Syncope.   HISTORY OF PRESENT ILLNESS:  Kyle Vasquez is a 81 year old male with a history of hypertension, a-fib on chronic Coumadin therapy, CAD, hyperlipidemia, a recent CVA with left-sided weakness, COPD, valvular heart disease, who presents with syncope. According to wife, he was at the church and subsequently had an episode of loss of consciousness where he slumped onto the table in front of him. The patient denies any falls, but reportedly had been unconscious for about 15 minutes. There was no seizure activity noted. No tongue biting. No urine or stool incontinence. The patient was also noted to be bradycardic with a heart rate in the 40s that lasted for about 2 minutes. The patient subsequently regained consciousness. However, his blood work showed a creatinine of 1.85 with a baseline creatinine of 1.4. INR was 2.7. A CT head showed diffuse atrophy and necrotic and chronic ischemic changes.   PAST MEDICAL HISTORY:  Significant for hypertension, anemia, a-fib, CAD, hyperlipidemia, COPD, valvular heart disease, peripheral neuropathy and myelopathy.   PHYSICAL EXAMINATION:  VITAL SIGNS:  Temperature was 97.6, pulse was 86, respirations 18, blood pressure 135/86, O2 saturation 96% on room air. He was not in distress.  HEENT:  NCAT.  NECK:  Supple.  HEART:  S1, S2.  LUNGS:  Clear to auscultation.  ABDOMEN:  Soft.  EXTREMITIES:  1+ edema.  NEUROLOGIC:  Alert and  oriented, appeared somewhat weak on the right side from his previous stroke.   LABORATORY, DIAGNOSTIC AND RADIOLOGICAL DATA:  Glucose 113, BUN 21, creatinine 1.85, sodium 140, potassium 4.1, chloride 107, CO2 of 28, lipase 98, ALT of 16, AST of 18, alk phos 79. Troponin less than 0.02. WBC 3.4, hemoglobin 12.5, hematocrit 37.1, platelets 141, INR of 2.7.   HOSPITAL COURSE:  The patient was evaluated by cardiologist, Dr. Ubaldo Glassing, who felt that his symptoms could be vasovagal; however, he did order a Holter monitor test for him as an outpatient. The patient did rule out for an MI. Troponin has been negative. His creatinine improved to some extent at 1.45 and he was ambulated and allowed home. Ultrasound of the carotid showed mild atherosclerotic disease involving common carotid arteries, bifurcation of proximal IC arteries bilaterally. There was no hemodynamically significant stenosis. There was normal anterograde flow within the vertebral arteries. CT angiogram did not show evidence of aortic dissection or leak. There was patent aorto-bi-iliac stent graft. There appears to be a thrombosed left external iliac to femoral graft stented. The native vessel appeared patent. The superior mesenteric artery and bilateral renal arteries were patent with evidence of atherosclerotic stenosis. Renal nephrograms, asymmetrical bilateral renal atrophy was noted. A peripheral calcified mass was seen in the anterior mediastinum; this could represent a lymph node or a thymoma. Emphysematous changes and fibrosis of the lungs without evidence of active pulmonary disease were also noted. A CT of the head revealed diffuse atrophy and small vessel ischemic changes. Ventricular dilatation was noted. Normal hydrocephalus had been excluded.  Acute or subacute infarct changes in the distribution of the right posterior cerebral artery were also noted. No acute intracranial hemorrhage. The patient was also seen by neurologist, Dr. Leotis Pain, who felt that the workup for normal pressure hydrocephalus could be done as an outpatient and a consultation could be done if warranted as an outpatient. The patient was stable at the time of discharge and he was discharged home on the following medications.   DISCHARGE MEDICATIONS:  1.  Cozaar 50 mg once a day.  2.  Amlodipine 5 mg once a day.  3.  Levothyroxine 25 mcg a day.  4.  Cyanocobalamin 500 mcg 1 tablet b.i.d.  5.  Crestor 5 mg once a day.  6.  Flomax 0.4 mg a day.  7.  Warfarin 7.5 mg on Tuesdays and Thursdays, and 5 mg on other days.  8.  Albuterol inhaler 2 puffs q.i.d. p.r.n.   FOLLOWUP:  The patient is advised low-sodium diet and also follow up with Dr. Ubaldo Glassing, cardiologist, and also with myself, Dr. Ginette Pitman, in 2 weeks.   TOTAL TIME SPENT IN DISCHARGE OF THIS PATIENT:  35 minutes.  ____________________________ Tracie Harrier, MD vh:jm D: 03/29/2013 17:32:32 ET T: 03/29/2013 18:07:04 ET JOB#: [id number]  cc: Tracie Harrier, MD, <Dictator> Tracie Harrier MD ELECTRONICALLY SIGNED 04/25/2013 13:14

## 2014-07-19 NOTE — Consult Note (Signed)
   Present Illness 81 yo male with chronic afib treated with chronic anticoagulation and rate control with recent cva treated at [facility] who was admitted after suffering a syncopal episode. He was eating at a church dinner when he statd he becam nauseated followed by a syncopal episode. Initial assessment noted him to be bradycardic. EKG and telemetry since admission reveals afib with variable ventricular response. No signficant bradycardia noted. No pauses. Head ct did not reveal any new problems. INR was therapeutic. Marland Kitchen Echo was unremarkable.   Physical Exam:  GEN no acute distress   HEENT hearing intact to voice, moist oral mucosa   NECK supple   RESP normal resp effort  clear BS   CARD Irregular rate and rhythm  Murmur   ABD no liver/spleen enlargement  normal BS   LYMPH negative axillae   EXTR negative cyanosis/clubbing   SKIN normal to palpation   NEURO cranial nerves intact, motor/sensory function intact   PSYCH A+O to time, place, person   Review of Systems:  Subjective/Chief Complaint nasuea and syncope   General: Weakness   Skin: No Complaints   ENT: No Complaints   Eyes: No Complaints   Neck: No Complaints   Respiratory: No Complaints   Cardiovascular: Palpitations   Gastrointestinal: No Complaints   Genitourinary: No Complaints   Vascular: No Complaints   Musculoskeletal: No Complaints   Neurologic: Fainting   Hematologic: No Complaints   Endocrine: No Complaints   Psychiatric: No Complaints   Review of Systems: All other systems were reviewed and found to be negative   Medications/Allergies Reviewed Medications/Allergies reviewed   EKG:  Abnormal NSSTTW changes    No Known Allergies:    Impression 81 yo male with chronic afib treated with chronic anticoagulation and rate control with recent cva treated at [facility] who was admitted after suffering a syncopal episode. He was eating at a church dinner when he statd he becam nauseated  followed by a syncopal episode. Initial assessment noted him to be bradycardic. EKG and telemetry since admission reveals afib with variable ventricular response. No signficant bradycardia noted. No pauses. Head ct did not reveal any new problems. Etiology of bradycardia and syncope is likely vagal in nature. INR was therapeutic. Would continue to anticoagulate and ambulate and consier discharge if stable. Would place a 24 hour holter at discharge and will follow up as outpatient. Echo was unremarkable.   Electronic Signatures: Teodoro Spray (MD)  (Signed 01-Jan-15 18:10)  Authored: General Aspect/Present Illness, History and Physical Exam, Review of System, EKG , Allergies, Impression/Plan   Last Updated: 01-Jan-15 18:10 by Teodoro Spray (MD)

## 2014-07-21 DIAGNOSIS — I10 Essential (primary) hypertension: Secondary | ICD-10-CM | POA: Diagnosis not present

## 2014-07-22 DIAGNOSIS — I10 Essential (primary) hypertension: Secondary | ICD-10-CM | POA: Diagnosis not present

## 2014-07-23 DIAGNOSIS — I10 Essential (primary) hypertension: Secondary | ICD-10-CM | POA: Diagnosis not present

## 2014-07-24 DIAGNOSIS — I10 Essential (primary) hypertension: Secondary | ICD-10-CM | POA: Diagnosis not present

## 2014-07-24 DIAGNOSIS — Z7901 Long term (current) use of anticoagulants: Secondary | ICD-10-CM | POA: Diagnosis not present

## 2014-07-25 DIAGNOSIS — I10 Essential (primary) hypertension: Secondary | ICD-10-CM | POA: Diagnosis not present

## 2014-07-26 DIAGNOSIS — I10 Essential (primary) hypertension: Secondary | ICD-10-CM | POA: Diagnosis not present

## 2014-07-27 DIAGNOSIS — J449 Chronic obstructive pulmonary disease, unspecified: Secondary | ICD-10-CM | POA: Diagnosis not present

## 2014-07-27 DIAGNOSIS — R262 Difficulty in walking, not elsewhere classified: Secondary | ICD-10-CM | POA: Diagnosis not present

## 2014-07-28 DIAGNOSIS — I10 Essential (primary) hypertension: Secondary | ICD-10-CM | POA: Diagnosis not present

## 2014-07-29 DIAGNOSIS — I10 Essential (primary) hypertension: Secondary | ICD-10-CM | POA: Diagnosis not present

## 2014-07-30 DIAGNOSIS — I10 Essential (primary) hypertension: Secondary | ICD-10-CM | POA: Diagnosis not present

## 2014-07-31 DIAGNOSIS — I10 Essential (primary) hypertension: Secondary | ICD-10-CM | POA: Diagnosis not present

## 2014-08-01 DIAGNOSIS — I10 Essential (primary) hypertension: Secondary | ICD-10-CM | POA: Diagnosis not present

## 2014-08-02 DIAGNOSIS — I10 Essential (primary) hypertension: Secondary | ICD-10-CM | POA: Diagnosis not present

## 2014-08-04 DIAGNOSIS — I10 Essential (primary) hypertension: Secondary | ICD-10-CM | POA: Diagnosis not present

## 2014-08-05 DIAGNOSIS — I10 Essential (primary) hypertension: Secondary | ICD-10-CM | POA: Diagnosis not present

## 2014-08-06 DIAGNOSIS — I10 Essential (primary) hypertension: Secondary | ICD-10-CM | POA: Diagnosis not present

## 2014-08-07 DIAGNOSIS — I10 Essential (primary) hypertension: Secondary | ICD-10-CM | POA: Diagnosis not present

## 2014-08-08 DIAGNOSIS — I10 Essential (primary) hypertension: Secondary | ICD-10-CM | POA: Diagnosis not present

## 2014-08-09 DIAGNOSIS — I10 Essential (primary) hypertension: Secondary | ICD-10-CM | POA: Diagnosis not present

## 2014-08-11 DIAGNOSIS — I10 Essential (primary) hypertension: Secondary | ICD-10-CM | POA: Diagnosis not present

## 2014-08-12 DIAGNOSIS — I10 Essential (primary) hypertension: Secondary | ICD-10-CM | POA: Diagnosis not present

## 2014-08-13 DIAGNOSIS — R32 Unspecified urinary incontinence: Secondary | ICD-10-CM | POA: Diagnosis not present

## 2014-08-13 DIAGNOSIS — S020XXA Fracture of vault of skull, initial encounter for closed fracture: Secondary | ICD-10-CM | POA: Diagnosis not present

## 2014-08-13 DIAGNOSIS — I10 Essential (primary) hypertension: Secondary | ICD-10-CM | POA: Diagnosis not present

## 2014-08-14 DIAGNOSIS — I10 Essential (primary) hypertension: Secondary | ICD-10-CM | POA: Diagnosis not present

## 2014-08-15 DIAGNOSIS — I10 Essential (primary) hypertension: Secondary | ICD-10-CM | POA: Diagnosis not present

## 2014-08-16 DIAGNOSIS — R262 Difficulty in walking, not elsewhere classified: Secondary | ICD-10-CM | POA: Diagnosis not present

## 2014-08-16 DIAGNOSIS — I10 Essential (primary) hypertension: Secondary | ICD-10-CM | POA: Diagnosis not present

## 2014-08-16 DIAGNOSIS — J449 Chronic obstructive pulmonary disease, unspecified: Secondary | ICD-10-CM | POA: Diagnosis not present

## 2014-08-18 DIAGNOSIS — I10 Essential (primary) hypertension: Secondary | ICD-10-CM | POA: Diagnosis not present

## 2014-08-19 DIAGNOSIS — R32 Unspecified urinary incontinence: Secondary | ICD-10-CM | POA: Diagnosis not present

## 2014-08-19 DIAGNOSIS — S020XXA Fracture of vault of skull, initial encounter for closed fracture: Secondary | ICD-10-CM | POA: Diagnosis not present

## 2014-08-19 DIAGNOSIS — I10 Essential (primary) hypertension: Secondary | ICD-10-CM | POA: Diagnosis not present

## 2014-08-20 DIAGNOSIS — I10 Essential (primary) hypertension: Secondary | ICD-10-CM | POA: Diagnosis not present

## 2014-08-21 DIAGNOSIS — I10 Essential (primary) hypertension: Secondary | ICD-10-CM | POA: Diagnosis not present

## 2014-08-22 DIAGNOSIS — I10 Essential (primary) hypertension: Secondary | ICD-10-CM | POA: Diagnosis not present

## 2014-08-23 DIAGNOSIS — I10 Essential (primary) hypertension: Secondary | ICD-10-CM | POA: Diagnosis not present

## 2014-08-25 DIAGNOSIS — I10 Essential (primary) hypertension: Secondary | ICD-10-CM | POA: Diagnosis not present

## 2014-08-26 DIAGNOSIS — I10 Essential (primary) hypertension: Secondary | ICD-10-CM | POA: Diagnosis not present

## 2014-08-26 DIAGNOSIS — Z7901 Long term (current) use of anticoagulants: Secondary | ICD-10-CM | POA: Diagnosis not present

## 2014-08-27 DIAGNOSIS — I10 Essential (primary) hypertension: Secondary | ICD-10-CM | POA: Diagnosis not present

## 2014-08-27 DIAGNOSIS — J449 Chronic obstructive pulmonary disease, unspecified: Secondary | ICD-10-CM | POA: Diagnosis not present

## 2014-08-27 DIAGNOSIS — R262 Difficulty in walking, not elsewhere classified: Secondary | ICD-10-CM | POA: Diagnosis not present

## 2014-08-28 DIAGNOSIS — I10 Essential (primary) hypertension: Secondary | ICD-10-CM | POA: Diagnosis not present

## 2014-08-29 DIAGNOSIS — I10 Essential (primary) hypertension: Secondary | ICD-10-CM | POA: Diagnosis not present

## 2014-08-30 DIAGNOSIS — I10 Essential (primary) hypertension: Secondary | ICD-10-CM | POA: Diagnosis not present

## 2014-09-01 DIAGNOSIS — I10 Essential (primary) hypertension: Secondary | ICD-10-CM | POA: Diagnosis not present

## 2014-09-02 DIAGNOSIS — I10 Essential (primary) hypertension: Secondary | ICD-10-CM | POA: Diagnosis not present

## 2014-09-03 DIAGNOSIS — I10 Essential (primary) hypertension: Secondary | ICD-10-CM | POA: Diagnosis not present

## 2014-09-04 DIAGNOSIS — I10 Essential (primary) hypertension: Secondary | ICD-10-CM | POA: Diagnosis not present

## 2014-09-05 DIAGNOSIS — S020XXA Fracture of vault of skull, initial encounter for closed fracture: Secondary | ICD-10-CM | POA: Diagnosis not present

## 2014-09-05 DIAGNOSIS — I10 Essential (primary) hypertension: Secondary | ICD-10-CM | POA: Diagnosis not present

## 2014-09-05 DIAGNOSIS — R32 Unspecified urinary incontinence: Secondary | ICD-10-CM | POA: Diagnosis not present

## 2014-09-06 DIAGNOSIS — I10 Essential (primary) hypertension: Secondary | ICD-10-CM | POA: Diagnosis not present

## 2014-09-08 DIAGNOSIS — I10 Essential (primary) hypertension: Secondary | ICD-10-CM | POA: Diagnosis not present

## 2014-09-09 DIAGNOSIS — I10 Essential (primary) hypertension: Secondary | ICD-10-CM | POA: Diagnosis not present

## 2014-09-10 DIAGNOSIS — I10 Essential (primary) hypertension: Secondary | ICD-10-CM | POA: Diagnosis not present

## 2014-09-11 DIAGNOSIS — I10 Essential (primary) hypertension: Secondary | ICD-10-CM | POA: Diagnosis not present

## 2014-09-12 DIAGNOSIS — I10 Essential (primary) hypertension: Secondary | ICD-10-CM | POA: Diagnosis not present

## 2014-09-15 DIAGNOSIS — I10 Essential (primary) hypertension: Secondary | ICD-10-CM | POA: Diagnosis not present

## 2014-09-16 DIAGNOSIS — R262 Difficulty in walking, not elsewhere classified: Secondary | ICD-10-CM | POA: Diagnosis not present

## 2014-09-16 DIAGNOSIS — J449 Chronic obstructive pulmonary disease, unspecified: Secondary | ICD-10-CM | POA: Diagnosis not present

## 2014-09-16 DIAGNOSIS — I10 Essential (primary) hypertension: Secondary | ICD-10-CM | POA: Diagnosis not present

## 2014-09-17 DIAGNOSIS — I10 Essential (primary) hypertension: Secondary | ICD-10-CM | POA: Diagnosis not present

## 2014-09-18 DIAGNOSIS — I10 Essential (primary) hypertension: Secondary | ICD-10-CM | POA: Diagnosis not present

## 2014-09-19 DIAGNOSIS — I10 Essential (primary) hypertension: Secondary | ICD-10-CM | POA: Diagnosis not present

## 2014-09-22 DIAGNOSIS — I10 Essential (primary) hypertension: Secondary | ICD-10-CM | POA: Diagnosis not present

## 2014-09-23 DIAGNOSIS — I10 Essential (primary) hypertension: Secondary | ICD-10-CM | POA: Diagnosis not present

## 2014-09-24 DIAGNOSIS — I10 Essential (primary) hypertension: Secondary | ICD-10-CM | POA: Diagnosis not present

## 2014-09-25 DIAGNOSIS — I10 Essential (primary) hypertension: Secondary | ICD-10-CM | POA: Diagnosis not present

## 2014-09-26 DIAGNOSIS — R262 Difficulty in walking, not elsewhere classified: Secondary | ICD-10-CM | POA: Diagnosis not present

## 2014-09-26 DIAGNOSIS — I10 Essential (primary) hypertension: Secondary | ICD-10-CM | POA: Diagnosis not present

## 2014-09-26 DIAGNOSIS — J449 Chronic obstructive pulmonary disease, unspecified: Secondary | ICD-10-CM | POA: Diagnosis not present

## 2014-09-29 DIAGNOSIS — I10 Essential (primary) hypertension: Secondary | ICD-10-CM | POA: Diagnosis not present

## 2014-09-30 DIAGNOSIS — I10 Essential (primary) hypertension: Secondary | ICD-10-CM | POA: Diagnosis not present

## 2014-10-01 DIAGNOSIS — J42 Unspecified chronic bronchitis: Secondary | ICD-10-CM | POA: Diagnosis not present

## 2014-10-01 DIAGNOSIS — Z8673 Personal history of transient ischemic attack (TIA), and cerebral infarction without residual deficits: Secondary | ICD-10-CM | POA: Diagnosis not present

## 2014-10-01 DIAGNOSIS — I10 Essential (primary) hypertension: Secondary | ICD-10-CM | POA: Diagnosis not present

## 2014-10-01 DIAGNOSIS — M5416 Radiculopathy, lumbar region: Secondary | ICD-10-CM | POA: Diagnosis not present

## 2014-10-02 DIAGNOSIS — I10 Essential (primary) hypertension: Secondary | ICD-10-CM | POA: Diagnosis not present

## 2014-10-03 DIAGNOSIS — I10 Essential (primary) hypertension: Secondary | ICD-10-CM | POA: Diagnosis not present

## 2014-10-06 DIAGNOSIS — I10 Essential (primary) hypertension: Secondary | ICD-10-CM | POA: Diagnosis not present

## 2014-10-07 DIAGNOSIS — I10 Essential (primary) hypertension: Secondary | ICD-10-CM | POA: Diagnosis not present

## 2014-10-08 DIAGNOSIS — I10 Essential (primary) hypertension: Secondary | ICD-10-CM | POA: Diagnosis not present

## 2014-10-09 DIAGNOSIS — I10 Essential (primary) hypertension: Secondary | ICD-10-CM | POA: Diagnosis not present

## 2014-10-10 DIAGNOSIS — I10 Essential (primary) hypertension: Secondary | ICD-10-CM | POA: Diagnosis not present

## 2014-10-13 DIAGNOSIS — I10 Essential (primary) hypertension: Secondary | ICD-10-CM | POA: Diagnosis not present

## 2014-10-14 DIAGNOSIS — I10 Essential (primary) hypertension: Secondary | ICD-10-CM | POA: Diagnosis not present

## 2014-10-15 DIAGNOSIS — I10 Essential (primary) hypertension: Secondary | ICD-10-CM | POA: Diagnosis not present

## 2014-10-16 DIAGNOSIS — J449 Chronic obstructive pulmonary disease, unspecified: Secondary | ICD-10-CM | POA: Diagnosis not present

## 2014-10-16 DIAGNOSIS — I10 Essential (primary) hypertension: Secondary | ICD-10-CM | POA: Diagnosis not present

## 2014-10-16 DIAGNOSIS — R262 Difficulty in walking, not elsewhere classified: Secondary | ICD-10-CM | POA: Diagnosis not present

## 2014-10-17 DIAGNOSIS — I10 Essential (primary) hypertension: Secondary | ICD-10-CM | POA: Diagnosis not present

## 2014-10-20 DIAGNOSIS — I10 Essential (primary) hypertension: Secondary | ICD-10-CM | POA: Diagnosis not present

## 2014-10-21 DIAGNOSIS — I10 Essential (primary) hypertension: Secondary | ICD-10-CM | POA: Diagnosis not present

## 2014-10-22 DIAGNOSIS — I10 Essential (primary) hypertension: Secondary | ICD-10-CM | POA: Diagnosis not present

## 2014-10-23 DIAGNOSIS — I10 Essential (primary) hypertension: Secondary | ICD-10-CM | POA: Diagnosis not present

## 2014-10-24 DIAGNOSIS — R32 Unspecified urinary incontinence: Secondary | ICD-10-CM | POA: Diagnosis not present

## 2014-10-24 DIAGNOSIS — S020XXA Fracture of vault of skull, initial encounter for closed fracture: Secondary | ICD-10-CM | POA: Diagnosis not present

## 2014-10-24 DIAGNOSIS — I10 Essential (primary) hypertension: Secondary | ICD-10-CM | POA: Diagnosis not present

## 2014-10-27 DIAGNOSIS — I10 Essential (primary) hypertension: Secondary | ICD-10-CM | POA: Diagnosis not present

## 2014-10-27 DIAGNOSIS — R262 Difficulty in walking, not elsewhere classified: Secondary | ICD-10-CM | POA: Diagnosis not present

## 2014-10-27 DIAGNOSIS — J449 Chronic obstructive pulmonary disease, unspecified: Secondary | ICD-10-CM | POA: Diagnosis not present

## 2014-10-28 DIAGNOSIS — I10 Essential (primary) hypertension: Secondary | ICD-10-CM | POA: Diagnosis not present

## 2014-10-29 DIAGNOSIS — I10 Essential (primary) hypertension: Secondary | ICD-10-CM | POA: Diagnosis not present

## 2014-10-30 DIAGNOSIS — I10 Essential (primary) hypertension: Secondary | ICD-10-CM | POA: Diagnosis not present

## 2014-10-31 DIAGNOSIS — I10 Essential (primary) hypertension: Secondary | ICD-10-CM | POA: Diagnosis not present

## 2014-11-03 DIAGNOSIS — J42 Unspecified chronic bronchitis: Secondary | ICD-10-CM | POA: Diagnosis not present

## 2014-11-03 DIAGNOSIS — I10 Essential (primary) hypertension: Secondary | ICD-10-CM | POA: Diagnosis not present

## 2014-11-03 DIAGNOSIS — I482 Chronic atrial fibrillation: Secondary | ICD-10-CM | POA: Diagnosis not present

## 2014-11-03 DIAGNOSIS — E780 Pure hypercholesterolemia: Secondary | ICD-10-CM | POA: Diagnosis not present

## 2014-11-03 DIAGNOSIS — Z8673 Personal history of transient ischemic attack (TIA), and cerebral infarction without residual deficits: Secondary | ICD-10-CM | POA: Diagnosis not present

## 2014-11-03 DIAGNOSIS — N183 Chronic kidney disease, stage 3 (moderate): Secondary | ICD-10-CM | POA: Diagnosis not present

## 2014-11-04 DIAGNOSIS — I10 Essential (primary) hypertension: Secondary | ICD-10-CM | POA: Diagnosis not present

## 2014-11-05 DIAGNOSIS — I10 Essential (primary) hypertension: Secondary | ICD-10-CM | POA: Diagnosis not present

## 2014-11-06 DIAGNOSIS — I10 Essential (primary) hypertension: Secondary | ICD-10-CM | POA: Diagnosis not present

## 2014-11-07 DIAGNOSIS — M25511 Pain in right shoulder: Secondary | ICD-10-CM | POA: Diagnosis not present

## 2014-11-07 DIAGNOSIS — M542 Cervicalgia: Secondary | ICD-10-CM | POA: Diagnosis not present

## 2014-11-07 DIAGNOSIS — G8929 Other chronic pain: Secondary | ICD-10-CM | POA: Diagnosis not present

## 2014-11-07 DIAGNOSIS — M79621 Pain in right upper arm: Secondary | ICD-10-CM | POA: Diagnosis not present

## 2014-11-10 DIAGNOSIS — I10 Essential (primary) hypertension: Secondary | ICD-10-CM | POA: Diagnosis not present

## 2014-11-11 DIAGNOSIS — I10 Essential (primary) hypertension: Secondary | ICD-10-CM | POA: Diagnosis not present

## 2014-11-12 DIAGNOSIS — I10 Essential (primary) hypertension: Secondary | ICD-10-CM | POA: Diagnosis not present

## 2014-11-13 DIAGNOSIS — I10 Essential (primary) hypertension: Secondary | ICD-10-CM | POA: Diagnosis not present

## 2014-11-14 DIAGNOSIS — I10 Essential (primary) hypertension: Secondary | ICD-10-CM | POA: Diagnosis not present

## 2014-11-16 DIAGNOSIS — J449 Chronic obstructive pulmonary disease, unspecified: Secondary | ICD-10-CM | POA: Diagnosis not present

## 2014-11-16 DIAGNOSIS — R262 Difficulty in walking, not elsewhere classified: Secondary | ICD-10-CM | POA: Diagnosis not present

## 2014-11-17 DIAGNOSIS — I10 Essential (primary) hypertension: Secondary | ICD-10-CM | POA: Diagnosis not present

## 2014-11-18 DIAGNOSIS — I10 Essential (primary) hypertension: Secondary | ICD-10-CM | POA: Diagnosis not present

## 2014-11-19 DIAGNOSIS — I10 Essential (primary) hypertension: Secondary | ICD-10-CM | POA: Diagnosis not present

## 2014-11-20 DIAGNOSIS — S020XXA Fracture of vault of skull, initial encounter for closed fracture: Secondary | ICD-10-CM | POA: Diagnosis not present

## 2014-11-20 DIAGNOSIS — R32 Unspecified urinary incontinence: Secondary | ICD-10-CM | POA: Diagnosis not present

## 2014-11-20 DIAGNOSIS — I10 Essential (primary) hypertension: Secondary | ICD-10-CM | POA: Diagnosis not present

## 2014-11-21 DIAGNOSIS — I10 Essential (primary) hypertension: Secondary | ICD-10-CM | POA: Diagnosis not present

## 2014-11-24 DIAGNOSIS — I10 Essential (primary) hypertension: Secondary | ICD-10-CM | POA: Diagnosis not present

## 2014-11-25 DIAGNOSIS — I10 Essential (primary) hypertension: Secondary | ICD-10-CM | POA: Diagnosis not present

## 2014-11-26 DIAGNOSIS — I10 Essential (primary) hypertension: Secondary | ICD-10-CM | POA: Diagnosis not present

## 2014-11-27 DIAGNOSIS — I10 Essential (primary) hypertension: Secondary | ICD-10-CM | POA: Diagnosis not present

## 2014-11-28 DIAGNOSIS — I10 Essential (primary) hypertension: Secondary | ICD-10-CM | POA: Diagnosis not present

## 2014-12-01 DIAGNOSIS — I10 Essential (primary) hypertension: Secondary | ICD-10-CM | POA: Diagnosis not present

## 2014-12-02 DIAGNOSIS — I10 Essential (primary) hypertension: Secondary | ICD-10-CM | POA: Diagnosis not present

## 2014-12-03 DIAGNOSIS — I10 Essential (primary) hypertension: Secondary | ICD-10-CM | POA: Diagnosis not present

## 2014-12-04 DIAGNOSIS — I10 Essential (primary) hypertension: Secondary | ICD-10-CM | POA: Diagnosis not present

## 2014-12-05 DIAGNOSIS — I10 Essential (primary) hypertension: Secondary | ICD-10-CM | POA: Diagnosis not present

## 2014-12-08 DIAGNOSIS — I10 Essential (primary) hypertension: Secondary | ICD-10-CM | POA: Diagnosis not present

## 2014-12-09 DIAGNOSIS — I10 Essential (primary) hypertension: Secondary | ICD-10-CM | POA: Diagnosis not present

## 2014-12-10 DIAGNOSIS — I10 Essential (primary) hypertension: Secondary | ICD-10-CM | POA: Diagnosis not present

## 2014-12-11 DIAGNOSIS — I10 Essential (primary) hypertension: Secondary | ICD-10-CM | POA: Diagnosis not present

## 2014-12-12 DIAGNOSIS — I10 Essential (primary) hypertension: Secondary | ICD-10-CM | POA: Diagnosis not present

## 2014-12-15 DIAGNOSIS — I10 Essential (primary) hypertension: Secondary | ICD-10-CM | POA: Diagnosis not present

## 2014-12-16 DIAGNOSIS — I10 Essential (primary) hypertension: Secondary | ICD-10-CM | POA: Diagnosis not present

## 2014-12-17 DIAGNOSIS — I10 Essential (primary) hypertension: Secondary | ICD-10-CM | POA: Diagnosis not present

## 2014-12-18 DIAGNOSIS — I10 Essential (primary) hypertension: Secondary | ICD-10-CM | POA: Diagnosis not present

## 2014-12-19 DIAGNOSIS — I10 Essential (primary) hypertension: Secondary | ICD-10-CM | POA: Diagnosis not present

## 2014-12-19 DIAGNOSIS — S020XXA Fracture of vault of skull, initial encounter for closed fracture: Secondary | ICD-10-CM | POA: Diagnosis not present

## 2014-12-19 DIAGNOSIS — R32 Unspecified urinary incontinence: Secondary | ICD-10-CM | POA: Diagnosis not present

## 2014-12-22 DIAGNOSIS — I10 Essential (primary) hypertension: Secondary | ICD-10-CM | POA: Diagnosis not present

## 2014-12-23 DIAGNOSIS — I10 Essential (primary) hypertension: Secondary | ICD-10-CM | POA: Diagnosis not present

## 2014-12-24 DIAGNOSIS — I10 Essential (primary) hypertension: Secondary | ICD-10-CM | POA: Diagnosis not present

## 2014-12-25 DIAGNOSIS — I10 Essential (primary) hypertension: Secondary | ICD-10-CM | POA: Diagnosis not present

## 2014-12-26 DIAGNOSIS — I10 Essential (primary) hypertension: Secondary | ICD-10-CM | POA: Diagnosis not present

## 2014-12-29 DIAGNOSIS — I10 Essential (primary) hypertension: Secondary | ICD-10-CM | POA: Diagnosis not present

## 2014-12-30 DIAGNOSIS — I10 Essential (primary) hypertension: Secondary | ICD-10-CM | POA: Diagnosis not present

## 2014-12-31 DIAGNOSIS — I10 Essential (primary) hypertension: Secondary | ICD-10-CM | POA: Diagnosis not present

## 2015-01-01 DIAGNOSIS — I10 Essential (primary) hypertension: Secondary | ICD-10-CM | POA: Diagnosis not present

## 2015-01-02 DIAGNOSIS — I10 Essential (primary) hypertension: Secondary | ICD-10-CM | POA: Diagnosis not present

## 2015-01-05 DIAGNOSIS — I10 Essential (primary) hypertension: Secondary | ICD-10-CM | POA: Diagnosis not present

## 2015-01-06 DIAGNOSIS — I10 Essential (primary) hypertension: Secondary | ICD-10-CM | POA: Diagnosis not present

## 2015-01-07 DIAGNOSIS — I10 Essential (primary) hypertension: Secondary | ICD-10-CM | POA: Diagnosis not present

## 2015-01-08 DIAGNOSIS — I10 Essential (primary) hypertension: Secondary | ICD-10-CM | POA: Diagnosis not present

## 2015-01-09 DIAGNOSIS — I10 Essential (primary) hypertension: Secondary | ICD-10-CM | POA: Diagnosis not present

## 2015-01-12 DIAGNOSIS — I10 Essential (primary) hypertension: Secondary | ICD-10-CM | POA: Diagnosis not present

## 2015-01-13 DIAGNOSIS — I10 Essential (primary) hypertension: Secondary | ICD-10-CM | POA: Diagnosis not present

## 2015-01-14 DIAGNOSIS — I10 Essential (primary) hypertension: Secondary | ICD-10-CM | POA: Diagnosis not present

## 2015-01-15 DIAGNOSIS — I10 Essential (primary) hypertension: Secondary | ICD-10-CM | POA: Diagnosis not present

## 2015-01-16 DIAGNOSIS — I10 Essential (primary) hypertension: Secondary | ICD-10-CM | POA: Diagnosis not present

## 2015-01-19 DIAGNOSIS — I10 Essential (primary) hypertension: Secondary | ICD-10-CM | POA: Diagnosis not present

## 2015-01-20 DIAGNOSIS — I10 Essential (primary) hypertension: Secondary | ICD-10-CM | POA: Diagnosis not present

## 2015-01-21 DIAGNOSIS — S020XXA Fracture of vault of skull, initial encounter for closed fracture: Secondary | ICD-10-CM | POA: Diagnosis not present

## 2015-01-21 DIAGNOSIS — I10 Essential (primary) hypertension: Secondary | ICD-10-CM | POA: Diagnosis not present

## 2015-01-21 DIAGNOSIS — R32 Unspecified urinary incontinence: Secondary | ICD-10-CM | POA: Diagnosis not present

## 2015-01-22 DIAGNOSIS — I10 Essential (primary) hypertension: Secondary | ICD-10-CM | POA: Diagnosis not present

## 2015-01-23 DIAGNOSIS — I10 Essential (primary) hypertension: Secondary | ICD-10-CM | POA: Diagnosis not present

## 2015-01-26 DIAGNOSIS — I10 Essential (primary) hypertension: Secondary | ICD-10-CM | POA: Diagnosis not present

## 2015-01-27 DIAGNOSIS — I10 Essential (primary) hypertension: Secondary | ICD-10-CM | POA: Diagnosis not present

## 2015-01-28 DIAGNOSIS — I10 Essential (primary) hypertension: Secondary | ICD-10-CM | POA: Diagnosis not present

## 2015-01-29 DIAGNOSIS — I10 Essential (primary) hypertension: Secondary | ICD-10-CM | POA: Diagnosis not present

## 2015-01-30 DIAGNOSIS — I10 Essential (primary) hypertension: Secondary | ICD-10-CM | POA: Diagnosis not present

## 2015-02-02 DIAGNOSIS — I10 Essential (primary) hypertension: Secondary | ICD-10-CM | POA: Diagnosis not present

## 2015-02-03 DIAGNOSIS — I10 Essential (primary) hypertension: Secondary | ICD-10-CM | POA: Diagnosis not present

## 2015-02-04 DIAGNOSIS — R32 Unspecified urinary incontinence: Secondary | ICD-10-CM | POA: Diagnosis not present

## 2015-02-04 DIAGNOSIS — I10 Essential (primary) hypertension: Secondary | ICD-10-CM | POA: Diagnosis not present

## 2015-02-04 DIAGNOSIS — S020XXA Fracture of vault of skull, initial encounter for closed fracture: Secondary | ICD-10-CM | POA: Diagnosis not present

## 2015-02-05 DIAGNOSIS — N183 Chronic kidney disease, stage 3 (moderate): Secondary | ICD-10-CM | POA: Diagnosis not present

## 2015-02-05 DIAGNOSIS — I10 Essential (primary) hypertension: Secondary | ICD-10-CM | POA: Diagnosis not present

## 2015-02-05 DIAGNOSIS — Z8673 Personal history of transient ischemic attack (TIA), and cerebral infarction without residual deficits: Secondary | ICD-10-CM | POA: Diagnosis not present

## 2015-02-05 DIAGNOSIS — I482 Chronic atrial fibrillation: Secondary | ICD-10-CM | POA: Diagnosis not present

## 2015-02-05 DIAGNOSIS — G8929 Other chronic pain: Secondary | ICD-10-CM | POA: Diagnosis not present

## 2015-02-05 DIAGNOSIS — Z23 Encounter for immunization: Secondary | ICD-10-CM | POA: Diagnosis not present

## 2015-02-05 DIAGNOSIS — M5416 Radiculopathy, lumbar region: Secondary | ICD-10-CM | POA: Diagnosis not present

## 2015-02-05 DIAGNOSIS — M25511 Pain in right shoulder: Secondary | ICD-10-CM | POA: Diagnosis not present

## 2015-02-06 DIAGNOSIS — I10 Essential (primary) hypertension: Secondary | ICD-10-CM | POA: Diagnosis not present

## 2015-02-09 DIAGNOSIS — I10 Essential (primary) hypertension: Secondary | ICD-10-CM | POA: Diagnosis not present

## 2015-02-10 DIAGNOSIS — I10 Essential (primary) hypertension: Secondary | ICD-10-CM | POA: Diagnosis not present

## 2015-02-11 DIAGNOSIS — I10 Essential (primary) hypertension: Secondary | ICD-10-CM | POA: Diagnosis not present

## 2015-02-12 DIAGNOSIS — I10 Essential (primary) hypertension: Secondary | ICD-10-CM | POA: Diagnosis not present

## 2015-02-13 DIAGNOSIS — I10 Essential (primary) hypertension: Secondary | ICD-10-CM | POA: Diagnosis not present

## 2015-02-16 DIAGNOSIS — I10 Essential (primary) hypertension: Secondary | ICD-10-CM | POA: Diagnosis not present

## 2015-02-17 DIAGNOSIS — I10 Essential (primary) hypertension: Secondary | ICD-10-CM | POA: Diagnosis not present

## 2015-02-18 DIAGNOSIS — I10 Essential (primary) hypertension: Secondary | ICD-10-CM | POA: Diagnosis not present

## 2015-02-19 DIAGNOSIS — I10 Essential (primary) hypertension: Secondary | ICD-10-CM | POA: Diagnosis not present

## 2015-02-20 DIAGNOSIS — I10 Essential (primary) hypertension: Secondary | ICD-10-CM | POA: Diagnosis not present

## 2015-02-23 DIAGNOSIS — I10 Essential (primary) hypertension: Secondary | ICD-10-CM | POA: Diagnosis not present

## 2015-02-24 DIAGNOSIS — I10 Essential (primary) hypertension: Secondary | ICD-10-CM | POA: Diagnosis not present

## 2015-02-25 DIAGNOSIS — I10 Essential (primary) hypertension: Secondary | ICD-10-CM | POA: Diagnosis not present

## 2015-02-26 DIAGNOSIS — I10 Essential (primary) hypertension: Secondary | ICD-10-CM | POA: Diagnosis not present

## 2015-02-27 DIAGNOSIS — I10 Essential (primary) hypertension: Secondary | ICD-10-CM | POA: Diagnosis not present

## 2015-03-02 DIAGNOSIS — I10 Essential (primary) hypertension: Secondary | ICD-10-CM | POA: Diagnosis not present

## 2015-03-03 DIAGNOSIS — I10 Essential (primary) hypertension: Secondary | ICD-10-CM | POA: Diagnosis not present

## 2015-03-04 DIAGNOSIS — I10 Essential (primary) hypertension: Secondary | ICD-10-CM | POA: Diagnosis not present

## 2015-03-05 DIAGNOSIS — I10 Essential (primary) hypertension: Secondary | ICD-10-CM | POA: Diagnosis not present

## 2015-03-06 DIAGNOSIS — S020XXA Fracture of vault of skull, initial encounter for closed fracture: Secondary | ICD-10-CM | POA: Diagnosis not present

## 2015-03-06 DIAGNOSIS — R32 Unspecified urinary incontinence: Secondary | ICD-10-CM | POA: Diagnosis not present

## 2015-03-06 DIAGNOSIS — I10 Essential (primary) hypertension: Secondary | ICD-10-CM | POA: Diagnosis not present

## 2015-03-09 DIAGNOSIS — I10 Essential (primary) hypertension: Secondary | ICD-10-CM | POA: Diagnosis not present

## 2015-03-10 DIAGNOSIS — I10 Essential (primary) hypertension: Secondary | ICD-10-CM | POA: Diagnosis not present

## 2015-03-11 DIAGNOSIS — I10 Essential (primary) hypertension: Secondary | ICD-10-CM | POA: Diagnosis not present

## 2015-03-12 DIAGNOSIS — I10 Essential (primary) hypertension: Secondary | ICD-10-CM | POA: Diagnosis not present

## 2015-03-13 DIAGNOSIS — I10 Essential (primary) hypertension: Secondary | ICD-10-CM | POA: Diagnosis not present

## 2015-03-16 DIAGNOSIS — Z7901 Long term (current) use of anticoagulants: Secondary | ICD-10-CM | POA: Diagnosis not present

## 2015-03-16 DIAGNOSIS — I10 Essential (primary) hypertension: Secondary | ICD-10-CM | POA: Diagnosis not present

## 2015-03-16 DIAGNOSIS — N183 Chronic kidney disease, stage 3 (moderate): Secondary | ICD-10-CM | POA: Diagnosis not present

## 2015-03-17 DIAGNOSIS — I10 Essential (primary) hypertension: Secondary | ICD-10-CM | POA: Diagnosis not present

## 2015-03-18 DIAGNOSIS — I10 Essential (primary) hypertension: Secondary | ICD-10-CM | POA: Diagnosis not present

## 2015-03-19 DIAGNOSIS — I10 Essential (primary) hypertension: Secondary | ICD-10-CM | POA: Diagnosis not present

## 2015-03-20 DIAGNOSIS — I10 Essential (primary) hypertension: Secondary | ICD-10-CM | POA: Diagnosis not present

## 2015-03-23 DIAGNOSIS — I10 Essential (primary) hypertension: Secondary | ICD-10-CM | POA: Diagnosis not present

## 2015-03-24 DIAGNOSIS — I10 Essential (primary) hypertension: Secondary | ICD-10-CM | POA: Diagnosis not present

## 2015-03-25 DIAGNOSIS — I10 Essential (primary) hypertension: Secondary | ICD-10-CM | POA: Diagnosis not present

## 2015-03-26 DIAGNOSIS — I10 Essential (primary) hypertension: Secondary | ICD-10-CM | POA: Diagnosis not present

## 2015-03-27 DIAGNOSIS — I10 Essential (primary) hypertension: Secondary | ICD-10-CM | POA: Diagnosis not present

## 2015-03-30 DIAGNOSIS — I10 Essential (primary) hypertension: Secondary | ICD-10-CM | POA: Diagnosis not present

## 2015-03-31 DIAGNOSIS — I10 Essential (primary) hypertension: Secondary | ICD-10-CM | POA: Diagnosis not present

## 2015-04-01 DIAGNOSIS — I10 Essential (primary) hypertension: Secondary | ICD-10-CM | POA: Diagnosis not present

## 2015-04-02 DIAGNOSIS — I10 Essential (primary) hypertension: Secondary | ICD-10-CM | POA: Diagnosis not present

## 2015-04-02 DIAGNOSIS — Z7901 Long term (current) use of anticoagulants: Secondary | ICD-10-CM | POA: Diagnosis not present

## 2015-04-03 DIAGNOSIS — I10 Essential (primary) hypertension: Secondary | ICD-10-CM | POA: Diagnosis not present

## 2015-04-06 DIAGNOSIS — I10 Essential (primary) hypertension: Secondary | ICD-10-CM | POA: Diagnosis not present

## 2015-04-07 DIAGNOSIS — I10 Essential (primary) hypertension: Secondary | ICD-10-CM | POA: Diagnosis not present

## 2015-04-07 DIAGNOSIS — R32 Unspecified urinary incontinence: Secondary | ICD-10-CM | POA: Diagnosis not present

## 2015-04-07 DIAGNOSIS — S020XXA Fracture of vault of skull, initial encounter for closed fracture: Secondary | ICD-10-CM | POA: Diagnosis not present

## 2015-04-08 DIAGNOSIS — I10 Essential (primary) hypertension: Secondary | ICD-10-CM | POA: Diagnosis not present

## 2015-04-09 DIAGNOSIS — I10 Essential (primary) hypertension: Secondary | ICD-10-CM | POA: Diagnosis not present

## 2015-04-10 DIAGNOSIS — I10 Essential (primary) hypertension: Secondary | ICD-10-CM | POA: Diagnosis not present

## 2015-04-13 DIAGNOSIS — I10 Essential (primary) hypertension: Secondary | ICD-10-CM | POA: Diagnosis not present

## 2015-04-14 DIAGNOSIS — I10 Essential (primary) hypertension: Secondary | ICD-10-CM | POA: Diagnosis not present

## 2015-04-15 DIAGNOSIS — I10 Essential (primary) hypertension: Secondary | ICD-10-CM | POA: Diagnosis not present

## 2015-04-16 DIAGNOSIS — I10 Essential (primary) hypertension: Secondary | ICD-10-CM | POA: Diagnosis not present

## 2015-04-17 DIAGNOSIS — I10 Essential (primary) hypertension: Secondary | ICD-10-CM | POA: Diagnosis not present

## 2015-04-20 DIAGNOSIS — I10 Essential (primary) hypertension: Secondary | ICD-10-CM | POA: Diagnosis not present

## 2015-04-21 DIAGNOSIS — I10 Essential (primary) hypertension: Secondary | ICD-10-CM | POA: Diagnosis not present

## 2015-04-22 DIAGNOSIS — I10 Essential (primary) hypertension: Secondary | ICD-10-CM | POA: Diagnosis not present

## 2015-04-23 DIAGNOSIS — I10 Essential (primary) hypertension: Secondary | ICD-10-CM | POA: Diagnosis not present

## 2015-04-24 DIAGNOSIS — I10 Essential (primary) hypertension: Secondary | ICD-10-CM | POA: Diagnosis not present

## 2015-04-27 DIAGNOSIS — I10 Essential (primary) hypertension: Secondary | ICD-10-CM | POA: Diagnosis not present

## 2015-04-28 DIAGNOSIS — I10 Essential (primary) hypertension: Secondary | ICD-10-CM | POA: Diagnosis not present

## 2015-04-29 DIAGNOSIS — I10 Essential (primary) hypertension: Secondary | ICD-10-CM | POA: Diagnosis not present

## 2015-04-30 DIAGNOSIS — I10 Essential (primary) hypertension: Secondary | ICD-10-CM | POA: Diagnosis not present

## 2015-05-01 DIAGNOSIS — I10 Essential (primary) hypertension: Secondary | ICD-10-CM | POA: Diagnosis not present

## 2015-05-04 DIAGNOSIS — I10 Essential (primary) hypertension: Secondary | ICD-10-CM | POA: Diagnosis not present

## 2015-05-05 DIAGNOSIS — I10 Essential (primary) hypertension: Secondary | ICD-10-CM | POA: Diagnosis not present

## 2015-05-06 ENCOUNTER — Emergency Department
Admission: EM | Admit: 2015-05-06 | Discharge: 2015-05-06 | Disposition: A | Discharge status: Home / Self Care | Payer: Commercial Managed Care - HMO | Attending: Emergency Medicine | Admitting: Emergency Medicine

## 2015-05-06 ENCOUNTER — Emergency Department: Payer: Commercial Managed Care - HMO

## 2015-05-06 DIAGNOSIS — I959 Hypotension, unspecified: Secondary | ICD-10-CM | POA: Diagnosis not present

## 2015-05-06 DIAGNOSIS — I10 Essential (primary) hypertension: Secondary | ICD-10-CM | POA: Insufficient documentation

## 2015-05-06 DIAGNOSIS — R0602 Shortness of breath: Secondary | ICD-10-CM | POA: Diagnosis not present

## 2015-05-06 DIAGNOSIS — R0600 Dyspnea, unspecified: Secondary | ICD-10-CM | POA: Insufficient documentation

## 2015-05-06 DIAGNOSIS — Z87891 Personal history of nicotine dependence: Secondary | ICD-10-CM | POA: Insufficient documentation

## 2015-05-06 HISTORY — DX: Unspecified atrial fibrillation: I48.91

## 2015-05-06 HISTORY — DX: Essential (primary) hypertension: I10

## 2015-05-06 HISTORY — DX: Cerebral infarction, unspecified: I63.9

## 2015-05-06 LAB — COMPREHENSIVE METABOLIC PANEL
ALT: 11 U/L — ABNORMAL LOW (ref 17–63)
ANION GAP: 8 (ref 5–15)
AST: 17 U/L (ref 15–41)
Albumin: 3.1 g/dL — ABNORMAL LOW (ref 3.5–5.0)
Alkaline Phosphatase: 68 U/L (ref 38–126)
BILIRUBIN TOTAL: 0.9 mg/dL (ref 0.3–1.2)
BUN: 45 mg/dL — ABNORMAL HIGH (ref 6–20)
CO2: 24 mmol/L (ref 22–32)
Calcium: 8.6 mg/dL — ABNORMAL LOW (ref 8.9–10.3)
Chloride: 108 mmol/L (ref 101–111)
Creatinine, Ser: 2.83 mg/dL — ABNORMAL HIGH (ref 0.61–1.24)
GFR calc Af Amer: 23 mL/min — ABNORMAL LOW (ref >60)
GFR, EST NON AFRICAN AMERICAN: 19 mL/min — AB (ref >60)
Glucose, Bld: 111 mg/dL — ABNORMAL HIGH (ref 65–99)
POTASSIUM: 4.5 mmol/L (ref 3.5–5.1)
Sodium: 140 mmol/L (ref 135–145)
TOTAL PROTEIN: 6.8 g/dL (ref 6.5–8.1)

## 2015-05-06 LAB — URINALYSIS COMPLETE WITH MICROSCOPIC (ARMC ONLY)
Bilirubin Urine: NEGATIVE
Glucose, UA: NEGATIVE mg/dL
KETONES UR: NEGATIVE mg/dL
NITRITE: POSITIVE — AB
PROTEIN: 100 mg/dL — AB
SPECIFIC GRAVITY, URINE: 1.013 (ref 1.005–1.030)
pH: 5 (ref 5.0–8.0)

## 2015-05-06 LAB — CBC
HEMATOCRIT: 32.4 % — AB (ref 40.0–52.0)
Hemoglobin: 10.9 g/dL — ABNORMAL LOW (ref 13.0–18.0)
MCH: 30.2 pg (ref 26.0–34.0)
MCHC: 33.6 g/dL (ref 32.0–36.0)
MCV: 89.8 fL (ref 80.0–100.0)
Platelets: 171 10*3/uL (ref 150–440)
RBC: 3.6 MIL/uL — ABNORMAL LOW (ref 4.40–5.90)
RDW: 13.9 % (ref 11.5–14.5)
WBC: 8.3 10*3/uL (ref 3.8–10.6)

## 2015-05-06 LAB — TROPONIN I
TROPONIN I: 0.05 ng/mL — AB (ref <0.031)
Troponin I: 0.07 ng/mL — ABNORMAL HIGH (ref <0.031)

## 2015-05-06 NOTE — Discharge Instructions (Signed)
Hypotension As your heart beats, it forces blood through your body. This force is called blood pressure. If you have hypotension, you have low blood pressure. When your blood pressure is too low, you may not get enough blood to your brain. You may feel weak, feel lightheaded, have a fast heartbeat, or even pass out (faint). HOME CARE  Drink enough fluids to keep your pee (urine) clear or pale yellow.  Take all medicines as told by your doctor.  Get up slowly after sitting or lying down.  Wear support stockings as told by your doctor.  Maintain a healthy diet by including foods such as fruits, vegetables, nuts, whole grains, and lean meats. GET HELP IF:  You are throwing up (vomiting) or have watery poop (diarrhea).  You have a fever for more than 2-3 days.  You feel more thirsty than usual.  You feel weak and tired. GET HELP RIGHT AWAY IF:   You pass out (faint).  You have chest pain or a fast or irregular heartbeat.  You lose feeling in part of your body.  You cannot move your arms or legs.  You have trouble speaking.  You get sweaty or feel lightheaded. MAKE SURE YOU:   Understand these instructions.  Will watch your condition.  Will get help right away if you are not doing well or get worse.   This information is not intended to replace advice given to you by your health care provider. Make sure you discuss any questions you have with your health care provider.   Document Released: 06/08/2009 Document Revised: 11/14/2012 Document Reviewed: 09/14/2012 Elsevier Interactive Patient Education 2016 [place] [employer] Shortness of breath means you have trouble breathing. It could also mean that you have a medical problem. You should get immediate medical care for shortness of breath. CAUSES   Not enough oxygen in the air such as with high altitudes or a smoke-filled room.  Certain lung diseases, infections, or problems.  Heart disease or  conditions, such as angina or heart failure.  Low red blood cells (anemia).  Poor physical fitness, which can cause shortness of breath when you exercise.  Chest or back injuries or stiffness.  Being overweight.  Smoking.  Anxiety, which can make you feel like you are not getting enough air. DIAGNOSIS  Serious medical problems can often be found during your physical exam. Tests may also be done to determine why you are having shortness of breath. Tests may include:  Chest X-rays.  Lung function tests.  Blood tests.  An electrocardiogram (ECG).  An ambulatory electrocardiogram. An ambulatory ECG records your heartbeat patterns over a 24-hour period.  Exercise testing.  A transthoracic echocardiogram (TTE). During echocardiography, sound waves are used to evaluate how blood flows through your heart.  A transesophageal echocardiogram (TEE).  Imaging scans. Your health care provider may not be able to find a cause for your shortness of breath after your exam. In this case, it is important to have a follow-up exam with your health care provider as directed.  TREATMENT  Treatment for shortness of breath depends on the cause of your symptoms and can vary greatly. HOME CARE INSTRUCTIONS   Do not smoke. Smoking is a common cause of shortness of breath. If you smoke, ask for help to quit.  Avoid being around chemicals or things that may bother your breathing, such as paint fumes and dust.  Rest as needed. Slowly resume your usual activities.  If medicines were prescribed, take  them as directed for the full length of time directed. This includes oxygen and any inhaled medicines.  Keep all follow-up appointments as directed by your health care provider. SEEK MEDICAL CARE IF:   Your condition does not improve in the time expected.  You have a hard time doing your normal activities even with rest.  You have any new symptoms. SEEK IMMEDIATE MEDICAL CARE IF:   Your shortness  of breath gets worse.  You feel light-headed, faint, or develop a cough not controlled with medicines.  You start coughing up blood.  You have pain with breathing.  You have chest pain or pain in your arms, shoulders, or abdomen.  You have a fever.  You are unable to walk up stairs or exercise the way you normally do. MAKE SURE YOU:  Understand these instructions.  Will watch your condition.  Will get help right away if you are not doing well or get worse.   This information is not intended to replace advice given to you by your health care provider. Make sure you discuss any questions you have with your health care provider.   Document Released: 12/07/2000 Document Revised: 03/19/2013 Document Reviewed: 05/30/2011 Elsevier Interactive Patient Education [employer].

## 2015-05-06 NOTE — ED Notes (Addendum)
Pt sent to ED from [facility] office w/ c/o hypotension.  Pt was sent because of sys BP of 40's.  Pt alert and oriented in bed.  BP 128/82.  Pt denies pain, CP, LOC, dizziness or SOB

## 2015-05-06 NOTE — ED Provider Notes (Signed)
[facility] Emergency Department Provider Note  Time seen: 5:59 PM  I have reviewed the triage vital signs and the nursing notes.   HISTORY  Chief Complaint Hypotension    HPI Kyle Vasquez is a 82 y.o. male with a past medical history of hypertension, CVA, atrial fibrillation who presents to the emergency department with hypotension and shortness breath. According to the patient and report the patient was at Dr.Fath's office for a routine checkup when he began feeling short of breath and lightheaded. They checked his blood pressure is found to be considerably hypotensive so he was brought to the emergency department by EMS. Upon arrival the patient's blood pressure is 128/82. Family member states the patient has done this before, and they could not figure out why. Patient denies any complaints at this time. Denies any chest pain now or at any time. Denies any current shortness of breath. Denies any nausea or diaphoresis. Patient states he feels well.    Past Medical History  Diagnosis Date  . Hypertension   . Stroke ([place])   . Atrial fibrillation ([place])     There are no active problems to display for this patient.   Past Surgical History  Procedure Laterality Date  . Cardiac surgery    . Back surgery    . Neck surgery      No current outpatient prescriptions on file.  Allergies Review of patient's allergies indicates no known allergies.  No family history on file.  Social History Social History  Substance Use Topics  . Smoking status: Former Research scientist (life sciences)  . Smokeless tobacco: None  . Alcohol Use: No    Review of Systems Constitutional: Negative for fever. Cardiovascular: Negative for chest pain. Respiratory: Shortness of breath which has resolved. Gastrointestinal: Negative for abdominal pain Neurological: Negative for headache 10-point ROS otherwise negative.  ____________________________________________   PHYSICAL EXAM:  VITAL  SIGNS: ED Triage Vitals  Enc Vitals Group     BP 05/06/15 1632 128/82 mmHg     Pulse Rate 05/06/15 1632 75     Resp 05/06/15 1632 20     Temp 05/06/15 1632 97.8 F (36.6 C)     Temp Source 05/06/15 1632 Oral     SpO2 05/06/15 1632 95 %     Weight 05/06/15 1632 185 lb (83.915 kg)     Height 05/06/15 1632 6\' 5"  (1.956 m)     Head Cir --      Peak Flow --      Pain Score 05/06/15 1634 0     Pain Loc --      Pain Edu? --      Excl. in [place]? --     Constitutional: Alert and oriented. Well appearing and in no distress. Eyes: Normal exam ENT   Head: Normocephalic and atraumatic   Mouth/Throat: Mucous membranes are moist. Cardiovascular: Normal rate, regular rhythm. No murmur Respiratory: Normal respiratory effort without tachypnea nor retractions. Breath sounds are clear Gastrointestinal: Soft and nontender. No distention.  Musculoskeletal: Nontender with normal range of motion in all extremities.  Neurologic:  Normal speech and language. No gross focal neurologic deficits  Skin:  Skin is warm, dry and intact.  Psychiatric: Mood and affect are normal. Speech and behavior are normal.  ____________________________________________    EKG  EKG reviewed and interpreted by myself shows atrial fibrillation 102 bpm, widened QRS, normal axis, nonspecific ST changes.  ____________________________________________    RADIOLOGY  Chest x-ray shows no acute abnormality  ____________________________________________  INITIAL IMPRESSION / ASSESSMENT AND PLAN / ED COURSE  Pertinent labs & imaging results that were available during my care of the patient were reviewed by me and considered in my medical decision making (see chart for details).  Patient presents to the emergency department with hypotension and shortness of breath. Per report they state the patient had a systolic pressure in the 123456. Patient and family deny any loss of consciousness, upon arrival patient has a normal  blood pressure. We will check labs, chest x-ray, IV hydrate and closely monitor in the emergency department. Currently the patient denies any complaints.  Chest x-ray shows no acute abnormality. Patient has a slightly elevated troponin of 0.05, he also has an elevated creatinine slightly over his baseline. Troponin elevation is likely due to the mild creatinine elevation. However given the patient's symptoms tonight we will repeat a troponin 3 hours after the initial troponin. As long as the repeat troponin is within normal limits we will discharge patient home with cardiology follow-up. Discussed plan of care w/ the patient is agreeable.  Second troponin largely unchanged, 0.07. Patient continues to be asymptomatic, wishing to go home. We'll discharge home with cardiology follow-up as soon as possible. Patient agreeable. ____________________________________________   FINAL CLINICAL IMPRESSION(S) / ED DIAGNOSES  Dyspnea Hypotension   Harvest Dark, MD 05/06/15 2157

## 2015-05-07 DIAGNOSIS — I10 Essential (primary) hypertension: Secondary | ICD-10-CM | POA: Diagnosis not present

## 2015-05-08 DIAGNOSIS — I10 Essential (primary) hypertension: Secondary | ICD-10-CM | POA: Diagnosis not present

## 2015-05-08 DIAGNOSIS — R32 Unspecified urinary incontinence: Secondary | ICD-10-CM | POA: Diagnosis not present

## 2015-05-08 DIAGNOSIS — S020XXA Fracture of vault of skull, initial encounter for closed fracture: Secondary | ICD-10-CM | POA: Diagnosis not present

## 2015-05-11 DIAGNOSIS — I10 Essential (primary) hypertension: Secondary | ICD-10-CM | POA: Diagnosis not present

## 2015-05-12 DIAGNOSIS — I10 Essential (primary) hypertension: Secondary | ICD-10-CM | POA: Diagnosis not present

## 2015-05-13 DIAGNOSIS — I10 Essential (primary) hypertension: Secondary | ICD-10-CM | POA: Diagnosis not present

## 2015-05-14 DIAGNOSIS — Z7901 Long term (current) use of anticoagulants: Secondary | ICD-10-CM | POA: Diagnosis not present

## 2015-05-27 DIAGNOSIS — I10 Essential (primary) hypertension: Secondary | ICD-10-CM | POA: Diagnosis not present

## 2015-05-28 DIAGNOSIS — I10 Essential (primary) hypertension: Secondary | ICD-10-CM | POA: Diagnosis not present

## 2015-05-29 DIAGNOSIS — I10 Essential (primary) hypertension: Secondary | ICD-10-CM | POA: Diagnosis not present

## 2015-06-01 DIAGNOSIS — R32 Unspecified urinary incontinence: Secondary | ICD-10-CM | POA: Diagnosis not present

## 2015-06-01 DIAGNOSIS — I10 Essential (primary) hypertension: Secondary | ICD-10-CM | POA: Diagnosis not present

## 2015-06-01 DIAGNOSIS — S020XXA Fracture of vault of skull, initial encounter for closed fracture: Secondary | ICD-10-CM | POA: Diagnosis not present

## 2015-06-02 DIAGNOSIS — I10 Essential (primary) hypertension: Secondary | ICD-10-CM | POA: Diagnosis not present

## 2015-06-03 DIAGNOSIS — I10 Essential (primary) hypertension: Secondary | ICD-10-CM | POA: Diagnosis not present

## 2015-06-04 DIAGNOSIS — I959 Hypotension, unspecified: Secondary | ICD-10-CM | POA: Diagnosis not present

## 2015-06-04 DIAGNOSIS — F0280 Dementia in other diseases classified elsewhere without behavioral disturbance: Secondary | ICD-10-CM | POA: Diagnosis not present

## 2015-06-04 DIAGNOSIS — I482 Chronic atrial fibrillation: Secondary | ICD-10-CM | POA: Diagnosis not present

## 2015-06-04 DIAGNOSIS — G603 Idiopathic progressive neuropathy: Secondary | ICD-10-CM | POA: Diagnosis not present

## 2015-06-04 DIAGNOSIS — I10 Essential (primary) hypertension: Secondary | ICD-10-CM | POA: Diagnosis not present

## 2015-06-04 DIAGNOSIS — G309 Alzheimer's disease, unspecified: Secondary | ICD-10-CM | POA: Diagnosis not present

## 2015-06-04 DIAGNOSIS — Z8673 Personal history of transient ischemic attack (TIA), and cerebral infarction without residual deficits: Secondary | ICD-10-CM | POA: Diagnosis not present

## 2015-06-04 DIAGNOSIS — N183 Chronic kidney disease, stage 3 (moderate): Secondary | ICD-10-CM | POA: Diagnosis not present

## 2015-06-04 DIAGNOSIS — J42 Unspecified chronic bronchitis: Secondary | ICD-10-CM | POA: Diagnosis not present

## 2015-06-05 DIAGNOSIS — I714 Abdominal aortic aneurysm, without rupture: Secondary | ICD-10-CM | POA: Diagnosis not present

## 2015-06-05 DIAGNOSIS — I739 Peripheral vascular disease, unspecified: Secondary | ICD-10-CM | POA: Diagnosis not present

## 2015-06-05 DIAGNOSIS — I499 Cardiac arrhythmia, unspecified: Secondary | ICD-10-CM | POA: Diagnosis not present

## 2015-06-05 DIAGNOSIS — I10 Essential (primary) hypertension: Secondary | ICD-10-CM | POA: Diagnosis not present

## 2015-06-05 DIAGNOSIS — I6523 Occlusion and stenosis of bilateral carotid arteries: Secondary | ICD-10-CM | POA: Diagnosis not present

## 2015-06-05 DIAGNOSIS — I724 Aneurysm of artery of lower extremity: Secondary | ICD-10-CM | POA: Diagnosis not present

## 2015-06-08 DIAGNOSIS — I10 Essential (primary) hypertension: Secondary | ICD-10-CM | POA: Diagnosis not present

## 2015-06-09 DIAGNOSIS — I10 Essential (primary) hypertension: Secondary | ICD-10-CM | POA: Diagnosis not present

## 2015-06-10 DIAGNOSIS — I10 Essential (primary) hypertension: Secondary | ICD-10-CM | POA: Diagnosis not present

## 2015-06-11 DIAGNOSIS — I10 Essential (primary) hypertension: Secondary | ICD-10-CM | POA: Diagnosis not present

## 2015-06-11 DIAGNOSIS — Z7901 Long term (current) use of anticoagulants: Secondary | ICD-10-CM | POA: Diagnosis not present

## 2015-06-12 DIAGNOSIS — I10 Essential (primary) hypertension: Secondary | ICD-10-CM | POA: Diagnosis not present

## 2015-06-15 DIAGNOSIS — I10 Essential (primary) hypertension: Secondary | ICD-10-CM | POA: Diagnosis not present

## 2015-06-16 DIAGNOSIS — I10 Essential (primary) hypertension: Secondary | ICD-10-CM | POA: Diagnosis not present

## 2015-06-17 DIAGNOSIS — I10 Essential (primary) hypertension: Secondary | ICD-10-CM | POA: Diagnosis not present

## 2015-06-18 DIAGNOSIS — I10 Essential (primary) hypertension: Secondary | ICD-10-CM | POA: Diagnosis not present

## 2015-06-19 DIAGNOSIS — I10 Essential (primary) hypertension: Secondary | ICD-10-CM | POA: Diagnosis not present

## 2015-06-22 DIAGNOSIS — I10 Essential (primary) hypertension: Secondary | ICD-10-CM | POA: Diagnosis not present

## 2015-06-23 DIAGNOSIS — I10 Essential (primary) hypertension: Secondary | ICD-10-CM | POA: Diagnosis not present

## 2015-06-24 DIAGNOSIS — I10 Essential (primary) hypertension: Secondary | ICD-10-CM | POA: Diagnosis not present

## 2015-06-25 DIAGNOSIS — I10 Essential (primary) hypertension: Secondary | ICD-10-CM | POA: Diagnosis not present

## 2015-06-29 DIAGNOSIS — I10 Essential (primary) hypertension: Secondary | ICD-10-CM | POA: Diagnosis not present

## 2015-06-30 DIAGNOSIS — I10 Essential (primary) hypertension: Secondary | ICD-10-CM | POA: Diagnosis not present

## 2015-07-01 DIAGNOSIS — I10 Essential (primary) hypertension: Secondary | ICD-10-CM | POA: Diagnosis not present

## 2015-07-02 DIAGNOSIS — I10 Essential (primary) hypertension: Secondary | ICD-10-CM | POA: Diagnosis not present

## 2015-07-03 DIAGNOSIS — I10 Essential (primary) hypertension: Secondary | ICD-10-CM | POA: Diagnosis not present

## 2015-07-04 DIAGNOSIS — S020XXA Fracture of vault of skull, initial encounter for closed fracture: Secondary | ICD-10-CM | POA: Diagnosis not present

## 2015-07-04 DIAGNOSIS — R32 Unspecified urinary incontinence: Secondary | ICD-10-CM | POA: Diagnosis not present

## 2015-07-06 DIAGNOSIS — I10 Essential (primary) hypertension: Secondary | ICD-10-CM | POA: Diagnosis not present

## 2015-07-07 DIAGNOSIS — I10 Essential (primary) hypertension: Secondary | ICD-10-CM | POA: Diagnosis not present

## 2015-07-08 DIAGNOSIS — I10 Essential (primary) hypertension: Secondary | ICD-10-CM | POA: Diagnosis not present

## 2015-07-09 DIAGNOSIS — I10 Essential (primary) hypertension: Secondary | ICD-10-CM | POA: Diagnosis not present

## 2015-07-10 DIAGNOSIS — I10 Essential (primary) hypertension: Secondary | ICD-10-CM | POA: Diagnosis not present

## 2015-07-13 DIAGNOSIS — I10 Essential (primary) hypertension: Secondary | ICD-10-CM | POA: Diagnosis not present

## 2015-07-14 DIAGNOSIS — I10 Essential (primary) hypertension: Secondary | ICD-10-CM | POA: Diagnosis not present

## 2015-07-15 DIAGNOSIS — I10 Essential (primary) hypertension: Secondary | ICD-10-CM | POA: Diagnosis not present

## 2015-07-16 DIAGNOSIS — I10 Essential (primary) hypertension: Secondary | ICD-10-CM | POA: Diagnosis not present

## 2015-07-17 DIAGNOSIS — I10 Essential (primary) hypertension: Secondary | ICD-10-CM | POA: Diagnosis not present

## 2015-07-20 DIAGNOSIS — Z7901 Long term (current) use of anticoagulants: Secondary | ICD-10-CM | POA: Diagnosis not present

## 2015-07-20 DIAGNOSIS — I10 Essential (primary) hypertension: Secondary | ICD-10-CM | POA: Diagnosis not present

## 2015-07-21 DIAGNOSIS — I10 Essential (primary) hypertension: Secondary | ICD-10-CM | POA: Diagnosis not present

## 2015-07-22 DIAGNOSIS — I10 Essential (primary) hypertension: Secondary | ICD-10-CM | POA: Diagnosis not present

## 2015-07-23 DIAGNOSIS — I10 Essential (primary) hypertension: Secondary | ICD-10-CM | POA: Diagnosis not present

## 2015-07-24 DIAGNOSIS — I10 Essential (primary) hypertension: Secondary | ICD-10-CM | POA: Diagnosis not present

## 2015-07-27 DIAGNOSIS — I10 Essential (primary) hypertension: Secondary | ICD-10-CM | POA: Diagnosis not present

## 2015-07-28 DIAGNOSIS — I10 Essential (primary) hypertension: Secondary | ICD-10-CM | POA: Diagnosis not present

## 2015-07-29 DIAGNOSIS — I10 Essential (primary) hypertension: Secondary | ICD-10-CM | POA: Diagnosis not present

## 2015-07-30 DIAGNOSIS — I10 Essential (primary) hypertension: Secondary | ICD-10-CM | POA: Diagnosis not present

## 2015-07-31 DIAGNOSIS — R32 Unspecified urinary incontinence: Secondary | ICD-10-CM | POA: Diagnosis not present

## 2015-07-31 DIAGNOSIS — S020XXA Fracture of vault of skull, initial encounter for closed fracture: Secondary | ICD-10-CM | POA: Diagnosis not present

## 2015-07-31 DIAGNOSIS — I10 Essential (primary) hypertension: Secondary | ICD-10-CM | POA: Diagnosis not present

## 2015-08-03 DIAGNOSIS — I10 Essential (primary) hypertension: Secondary | ICD-10-CM | POA: Diagnosis not present

## 2015-08-04 DIAGNOSIS — I10 Essential (primary) hypertension: Secondary | ICD-10-CM | POA: Diagnosis not present

## 2015-08-05 DIAGNOSIS — I10 Essential (primary) hypertension: Secondary | ICD-10-CM | POA: Diagnosis not present

## 2015-08-06 DIAGNOSIS — I10 Essential (primary) hypertension: Secondary | ICD-10-CM | POA: Diagnosis not present

## 2015-08-07 DIAGNOSIS — I10 Essential (primary) hypertension: Secondary | ICD-10-CM | POA: Diagnosis not present

## 2015-08-10 DIAGNOSIS — I10 Essential (primary) hypertension: Secondary | ICD-10-CM | POA: Diagnosis not present

## 2015-08-11 DIAGNOSIS — I10 Essential (primary) hypertension: Secondary | ICD-10-CM | POA: Diagnosis not present

## 2015-08-12 DIAGNOSIS — I10 Essential (primary) hypertension: Secondary | ICD-10-CM | POA: Diagnosis not present

## 2015-08-13 DIAGNOSIS — I10 Essential (primary) hypertension: Secondary | ICD-10-CM | POA: Diagnosis not present

## 2015-08-14 DIAGNOSIS — I10 Essential (primary) hypertension: Secondary | ICD-10-CM | POA: Diagnosis not present

## 2015-08-17 DIAGNOSIS — Z7901 Long term (current) use of anticoagulants: Secondary | ICD-10-CM | POA: Diagnosis not present

## 2015-08-17 DIAGNOSIS — I10 Essential (primary) hypertension: Secondary | ICD-10-CM | POA: Diagnosis not present

## 2015-08-18 DIAGNOSIS — I10 Essential (primary) hypertension: Secondary | ICD-10-CM | POA: Diagnosis not present

## 2015-08-19 DIAGNOSIS — I10 Essential (primary) hypertension: Secondary | ICD-10-CM | POA: Diagnosis not present

## 2015-08-20 DIAGNOSIS — I10 Essential (primary) hypertension: Secondary | ICD-10-CM | POA: Diagnosis not present

## 2015-08-21 DIAGNOSIS — I10 Essential (primary) hypertension: Secondary | ICD-10-CM | POA: Diagnosis not present

## 2015-08-24 DIAGNOSIS — I10 Essential (primary) hypertension: Secondary | ICD-10-CM | POA: Diagnosis not present

## 2015-08-25 DIAGNOSIS — I10 Essential (primary) hypertension: Secondary | ICD-10-CM | POA: Diagnosis not present

## 2015-08-27 DIAGNOSIS — I10 Essential (primary) hypertension: Secondary | ICD-10-CM | POA: Diagnosis not present

## 2015-08-28 DIAGNOSIS — I10 Essential (primary) hypertension: Secondary | ICD-10-CM | POA: Diagnosis not present

## 2015-08-31 DIAGNOSIS — I10 Essential (primary) hypertension: Secondary | ICD-10-CM | POA: Diagnosis not present

## 2015-09-01 DIAGNOSIS — R32 Unspecified urinary incontinence: Secondary | ICD-10-CM | POA: Diagnosis not present

## 2015-09-01 DIAGNOSIS — I10 Essential (primary) hypertension: Secondary | ICD-10-CM | POA: Diagnosis not present

## 2015-09-01 DIAGNOSIS — S020XXA Fracture of vault of skull, initial encounter for closed fracture: Secondary | ICD-10-CM | POA: Diagnosis not present

## 2015-09-02 DIAGNOSIS — I10 Essential (primary) hypertension: Secondary | ICD-10-CM | POA: Diagnosis not present

## 2015-09-02 DIAGNOSIS — Z7901 Long term (current) use of anticoagulants: Secondary | ICD-10-CM | POA: Diagnosis not present

## 2015-09-03 DIAGNOSIS — I10 Essential (primary) hypertension: Secondary | ICD-10-CM | POA: Diagnosis not present

## 2015-09-04 DIAGNOSIS — I10 Essential (primary) hypertension: Secondary | ICD-10-CM | POA: Diagnosis not present

## 2015-09-07 ENCOUNTER — Emergency Department: Payer: Commercial Managed Care - HMO

## 2015-09-07 ENCOUNTER — Observation Stay
Admission: EM | Admit: 2015-09-07 | Discharge: 2015-09-08 | Disposition: A | Discharge status: Home / Self Care | Payer: Commercial Managed Care - HMO | Attending: Internal Medicine | Admitting: Internal Medicine

## 2015-09-07 ENCOUNTER — Encounter: Payer: Self-pay | Admitting: Emergency Medicine

## 2015-09-07 DIAGNOSIS — Z87891 Personal history of nicotine dependence: Secondary | ICD-10-CM | POA: Diagnosis not present

## 2015-09-07 DIAGNOSIS — I4891 Unspecified atrial fibrillation: Secondary | ICD-10-CM | POA: Insufficient documentation

## 2015-09-07 DIAGNOSIS — N3001 Acute cystitis with hematuria: Secondary | ICD-10-CM | POA: Diagnosis not present

## 2015-09-07 DIAGNOSIS — M5416 Radiculopathy, lumbar region: Secondary | ICD-10-CM | POA: Diagnosis not present

## 2015-09-07 DIAGNOSIS — Z79899 Other long term (current) drug therapy: Secondary | ICD-10-CM | POA: Diagnosis not present

## 2015-09-07 DIAGNOSIS — Z8673 Personal history of transient ischemic attack (TIA), and cerebral infarction without residual deficits: Secondary | ICD-10-CM | POA: Insufficient documentation

## 2015-09-07 DIAGNOSIS — R4189 Other symptoms and signs involving cognitive functions and awareness: Secondary | ICD-10-CM | POA: Diagnosis not present

## 2015-09-07 DIAGNOSIS — N183 Chronic kidney disease, stage 3 (moderate): Secondary | ICD-10-CM | POA: Diagnosis not present

## 2015-09-07 DIAGNOSIS — Z7401 Bed confinement status: Secondary | ICD-10-CM | POA: Insufficient documentation

## 2015-09-07 DIAGNOSIS — D649 Anemia, unspecified: Secondary | ICD-10-CM | POA: Insufficient documentation

## 2015-09-07 DIAGNOSIS — Z993 Dependence on wheelchair: Secondary | ICD-10-CM | POA: Diagnosis not present

## 2015-09-07 DIAGNOSIS — Z7951 Long term (current) use of inhaled steroids: Secondary | ICD-10-CM | POA: Insufficient documentation

## 2015-09-07 DIAGNOSIS — R001 Bradycardia, unspecified: Secondary | ICD-10-CM | POA: Diagnosis not present

## 2015-09-07 DIAGNOSIS — I480 Paroxysmal atrial fibrillation: Secondary | ICD-10-CM | POA: Diagnosis not present

## 2015-09-07 DIAGNOSIS — R55 Syncope and collapse: Secondary | ICD-10-CM | POA: Diagnosis not present

## 2015-09-07 DIAGNOSIS — Z7901 Long term (current) use of anticoagulants: Secondary | ICD-10-CM | POA: Diagnosis not present

## 2015-09-07 DIAGNOSIS — Z8249 Family history of ischemic heart disease and other diseases of the circulatory system: Secondary | ICD-10-CM | POA: Diagnosis not present

## 2015-09-07 DIAGNOSIS — Z952 Presence of prosthetic heart valve: Secondary | ICD-10-CM | POA: Insufficient documentation

## 2015-09-07 DIAGNOSIS — I495 Sick sinus syndrome: Secondary | ICD-10-CM | POA: Diagnosis not present

## 2015-09-07 DIAGNOSIS — I10 Essential (primary) hypertension: Secondary | ICD-10-CM | POA: Diagnosis not present

## 2015-09-07 DIAGNOSIS — R5381 Other malaise: Secondary | ICD-10-CM

## 2015-09-07 DIAGNOSIS — G309 Alzheimer's disease, unspecified: Secondary | ICD-10-CM | POA: Diagnosis not present

## 2015-09-07 DIAGNOSIS — N184 Chronic kidney disease, stage 4 (severe): Secondary | ICD-10-CM | POA: Diagnosis not present

## 2015-09-07 DIAGNOSIS — Z9889 Other specified postprocedural states: Secondary | ICD-10-CM | POA: Insufficient documentation

## 2015-09-07 DIAGNOSIS — N189 Chronic kidney disease, unspecified: Secondary | ICD-10-CM | POA: Diagnosis not present

## 2015-09-07 DIAGNOSIS — I129 Hypertensive chronic kidney disease with stage 1 through stage 4 chronic kidney disease, or unspecified chronic kidney disease: Secondary | ICD-10-CM | POA: Insufficient documentation

## 2015-09-07 DIAGNOSIS — J42 Unspecified chronic bronchitis: Secondary | ICD-10-CM | POA: Diagnosis not present

## 2015-09-07 DIAGNOSIS — G959 Disease of spinal cord, unspecified: Secondary | ICD-10-CM | POA: Diagnosis not present

## 2015-09-07 DIAGNOSIS — I482 Chronic atrial fibrillation: Secondary | ICD-10-CM | POA: Diagnosis not present

## 2015-09-07 DIAGNOSIS — R531 Weakness: Secondary | ICD-10-CM | POA: Insufficient documentation

## 2015-09-07 LAB — TROPONIN I: Troponin I: <0.03 ng/mL (ref <0.031)

## 2015-09-07 LAB — CBC WITH DIFFERENTIAL/PLATELET
BASOS ABS: 0 10*3/uL (ref 0–0.1)
BASOS PCT: 1 %
EOS ABS: 0.1 10*3/uL (ref 0–0.7)
EOS PCT: 4 %
HCT: 31.1 % — ABNORMAL LOW (ref 40.0–52.0)
Hemoglobin: 10.5 g/dL — ABNORMAL LOW (ref 13.0–18.0)
Lymphocytes Relative: 38 %
Lymphs Abs: 1.3 10*3/uL (ref 1.0–3.6)
MCH: 30.4 pg (ref 26.0–34.0)
MCHC: 33.6 g/dL (ref 32.0–36.0)
MCV: 90.5 fL (ref 80.0–100.0)
Monocytes Absolute: 0.3 10*3/uL (ref 0.2–1.0)
Monocytes Relative: 10 %
Neutro Abs: 1.5 10*3/uL (ref 1.4–6.5)
Neutrophils Relative %: 47 %
PLATELETS: 165 10*3/uL (ref 150–440)
RBC: 3.44 MIL/uL — AB (ref 4.40–5.90)
RDW: 14.6 % — ABNORMAL HIGH (ref 11.5–14.5)
WBC: 3.3 10*3/uL — AB (ref 3.8–10.6)

## 2015-09-07 LAB — LIPASE, BLOOD: LIPASE: 22 U/L (ref 11–51)

## 2015-09-07 LAB — COMPREHENSIVE METABOLIC PANEL
ALK PHOS: 65 U/L (ref 38–126)
ALT: 10 U/L — AB (ref 17–63)
AST: 14 U/L — AB (ref 15–41)
Albumin: 3.3 g/dL — ABNORMAL LOW (ref 3.5–5.0)
Anion gap: 7 (ref 5–15)
BILIRUBIN TOTAL: 0.4 mg/dL (ref 0.3–1.2)
BUN: 43 mg/dL — AB (ref 6–20)
CALCIUM: 8.8 mg/dL — AB (ref 8.9–10.3)
CO2: 24 mmol/L (ref 22–32)
CREATININE: 2.9 mg/dL — AB (ref 0.61–1.24)
Chloride: 110 mmol/L (ref 101–111)
GFR calc Af Amer: 22 mL/min — ABNORMAL LOW (ref >60)
GFR, EST NON AFRICAN AMERICAN: 19 mL/min — AB (ref >60)
Glucose, Bld: 95 mg/dL (ref 65–99)
POTASSIUM: 4.3 mmol/L (ref 3.5–5.1)
Sodium: 141 mmol/L (ref 135–145)
TOTAL PROTEIN: 7.1 g/dL (ref 6.5–8.1)

## 2015-09-07 LAB — PROTIME-INR
INR: 1.98
Prothrombin Time: 22.4 seconds — ABNORMAL HIGH (ref 11.4–15.0)

## 2015-09-07 LAB — MAGNESIUM: Magnesium: 2 mg/dL (ref 1.7–2.4)

## 2015-09-07 MED ORDER — FLUTICASONE FUROATE-VILANTEROL 200-25 MCG/INH IN AEPB
1.0000 | INHALATION_SPRAY | Freq: Every day | RESPIRATORY_TRACT | Status: DC
  Administered 2015-09-08: 1 via RESPIRATORY_TRACT
  Filled 2015-09-07: qty 28

## 2015-09-07 MED ORDER — SODIUM CHLORIDE 0.9 % IV BOLUS (SEPSIS): 500.0000 mL | INTRAVENOUS | Status: DC

## 2015-09-07 MED ORDER — SODIUM CHLORIDE 0.9% FLUSH
3.0000 mL | Freq: Two times a day (BID) | INTRAVENOUS | Status: DC
  Administered 2015-09-07 – 2015-09-08 (×2): 3 mL via INTRAVENOUS

## 2015-09-07 MED ORDER — WARFARIN SODIUM 1 MG PO TABS: 4.0000 mg | ORAL_TABLET | Freq: Every evening | ORAL | Status: DC

## 2015-09-07 MED ORDER — FUROSEMIDE 20 MG PO TABS
20.0000 mg | ORAL_TABLET | Freq: Every day | ORAL | Status: DC
  Administered 2015-09-08: 20 mg via ORAL
  Filled 2015-09-07: qty 1

## 2015-09-07 MED ORDER — ONDANSETRON HCL 4 MG/2ML IJ SOLN: 4.0000 mg | Freq: Four times a day (QID) | INTRAMUSCULAR | Status: DC | PRN

## 2015-09-07 MED ORDER — ACETAMINOPHEN 650 MG RE SUPP: 650.0000 mg | Freq: Four times a day (QID) | RECTAL | Status: DC | PRN

## 2015-09-07 MED ORDER — LOSARTAN POTASSIUM 25 MG PO TABS
25.0000 mg | ORAL_TABLET | Freq: Every day | ORAL | Status: DC
  Administered 2015-09-08: 25 mg via ORAL
  Filled 2015-09-07: qty 1

## 2015-09-07 MED ORDER — WARFARIN SODIUM 1 MG PO TABS
3.0000 mg | ORAL_TABLET | Freq: Once | ORAL | Status: AC
  Administered 2015-09-07: 3 mg via ORAL
  Filled 2015-09-07: qty 3

## 2015-09-07 MED ORDER — SODIUM CHLORIDE 0.9 % IV SOLN
Freq: Once | INTRAVENOUS | Status: AC
  Administered 2015-09-07: 18:00:00 via INTRAVENOUS

## 2015-09-07 MED ORDER — ALBUTEROL SULFATE (2.5 MG/3ML) 0.083% IN NEBU: 3.0000 mL | INHALATION_SOLUTION | Freq: Four times a day (QID) | RESPIRATORY_TRACT | Status: DC | PRN

## 2015-09-07 MED ORDER — WARFARIN SODIUM 1 MG PO TABS: 3.0000 mg | ORAL_TABLET | ORAL | Status: DC

## 2015-09-07 MED ORDER — ONDANSETRON HCL 4 MG PO TABS: 4.0000 mg | ORAL_TABLET | Freq: Four times a day (QID) | ORAL | Status: DC | PRN

## 2015-09-07 MED ORDER — WARFARIN SODIUM 1 MG PO TABS: 3.0000 mg | ORAL_TABLET | Freq: Every evening | ORAL | Status: DC

## 2015-09-07 MED ORDER — METOPROLOL TARTRATE 5 MG/5ML IV SOLN: 5.0000 mg | INTRAVENOUS | Status: DC | PRN

## 2015-09-07 MED ORDER — BISACODYL 5 MG PO TBEC: 5.0000 mg | DELAYED_RELEASE_TABLET | Freq: Every day | ORAL | Status: DC | PRN

## 2015-09-07 MED ORDER — SODIUM CHLORIDE 0.9 % IV SOLN
INTRAVENOUS | Status: DC
  Administered 2015-09-07: 23:00:00 via INTRAVENOUS

## 2015-09-07 MED ORDER — HYDRALAZINE HCL 20 MG/ML IJ SOLN
5.0000 mg | INTRAMUSCULAR | Status: DC | PRN
  Administered 2015-09-08: 5 mg via INTRAVENOUS
  Filled 2015-09-07: qty 1

## 2015-09-07 MED ORDER — WARFARIN SODIUM 1 MG PO TABS: 4.0000 mg | ORAL_TABLET | ORAL | Status: DC

## 2015-09-07 MED ORDER — ACETAMINOPHEN 325 MG PO TABS: 650.0000 mg | ORAL_TABLET | Freq: Four times a day (QID) | ORAL | Status: DC | PRN

## 2015-09-07 NOTE — ED Provider Notes (Signed)
[facility] Emergency Department Provider Note  ____________________________________________  Time seen: Approximately 5:37 PM  I have reviewed the triage vital signs and the nursing notes.   HISTORY  Chief Complaint Loss of Consciousness  Patient is somnolent, somewhat limiting the history  HPI Kyle Vasquez is a 82 y.o. male with a history of atrial fibrillation on warfarinand who is seen Dr. Ubaldo Glassing in the past for a syncopal episode thought to be vasovagal in nature.  He presents today after a near syncopal episode in his primary care doctor's office.  The patient reports that he has had a gradual onset of worsening generalized fatigue and generalized weakness for about a week.  They were going to the primary care doctor today for evaluation when he had his episode.  Reportedly his heart rate at the time was in the 30s and he was sent to the emergency department for further evaluation.  Currently he is somnolent but awakens easily to light voice and light touch.  He is alert and oriented when awake.  He denies any pain and any symptoms other than the generalized fatigue, but that is at least moderate in severity. Nothing in particular makes it better nor worse.  He was seen in the past for syncopal episode thought to be vasovagaland were holter monitor for a time. He has a history of atrial fibrillation and is on warfarin.  He denies chest pain, SOB, abd pain, N/V/D.   Past Medical History  Diagnosis Date  . Hypertension   . Stroke ([place])   . Atrial fibrillation [facility])     Patient Active Problem List   Diagnosis Date Noted  . Symptomatic bradycardia 09/07/2015  . Syncope and collapse 09/07/2015  . Atrial fibrillation ([place]) 09/07/2015  . HTN (hypertension) 09/07/2015  . Physical deconditioning 09/07/2015  . H/O: CVA (cerebrovascular accident) 09/07/2015    Past Surgical History  Procedure Laterality Date  . Cardiac surgery    . Back surgery    .  Neck surgery      No current outpatient prescriptions on file.  Allergies Review of patient's allergies indicates no known allergies.  No family history on file.  Social History Social History  Substance Use Topics  . Smoking status: Former Research scientist (life sciences)  . Smokeless tobacco: None  . Alcohol Use: No    Review of Systems Constitutional: No fever/chills Eyes: No visual changes. ENT: No sore throat. Cardiovascular: Denies chest pain. Respiratory: Denies shortness of breath. Gastrointestinal: No abdominal pain.  No nausea, no vomiting.  No diarrhea.  No constipation. Genitourinary: Negative for dysuria. Musculoskeletal: Negative for back pain. Skin: Negative for rash. Neurological: Negative for headaches, focal weakness or numbness.  Generalized fatigue and weakness throughout  10-point ROS otherwise negative.  ____________________________________________   PHYSICAL EXAM:  VITAL SIGNS: ED Triage Vitals  Enc Vitals Group     BP 09/07/15 1659 132/71 mmHg     Pulse Rate 09/07/15 1659 59     Resp 09/07/15 1659 18     Temp 09/07/15 1659 97.5 F (36.4 C)     Temp Source 09/07/15 1659 Oral     SpO2 09/07/15 1659 98 %     Weight 09/07/15 1659 192 lb 6.4 oz (87.272 kg)     Height 09/07/15 1659 6\' 5"  (1.956 m)     Head Cir --      Peak Flow --      Pain Score --      Pain Loc --  Pain Edu? --      Excl. in [place]? --     Constitutional: Alert and oriented but appears somnolent. No acute distress Eyes: Conjunctivae are normal. PERRL. EOMI. Head: Atraumatic. Nose: No congestion/rhinnorhea. Mouth/Throat: Mucous membranes are moist.  Oropharynx non-erythematous. Neck: No stridor.  No meningeal signs.   Cardiovascular: Normal rate, regular rhythm. Good peripheral circulation. Grossly normal heart sounds.   Respiratory: Normal respiratory effort.  No retractions. Lungs CTAB. Gastrointestinal: Soft and nontender. No distention.  Musculoskeletal: No lower extremity tenderness nor  edema. No gross deformities of extremities. Neurologic:  Normal speech and language. No gross focal neurologic deficits are appreciated.  Skin:  Skin is warm, dry and intact. No rash noted. Psychiatric: Mood and affect are normal. Speech and behavior are normal.  ____________________________________________   LABS (all labs ordered are listed, but only abnormal results are displayed)  Labs Reviewed  CBC WITH DIFFERENTIAL/PLATELET - Abnormal; Notable for the following:    WBC 3.3 (*)    RBC 3.44 (*)    Hemoglobin 10.5 (*)    HCT 31.1 (*)    RDW 14.6 (*)    All other components within normal limits  COMPREHENSIVE METABOLIC PANEL - Abnormal; Notable for the following:    BUN 43 (*)    Creatinine, Ser 2.90 (*)    Calcium 8.8 (*)    Albumin 3.3 (*)    AST 14 (*)    ALT 10 (*)    GFR calc non Af Amer 19 (*)    GFR calc Af Amer 22 (*)    All other components within normal limits  PROTIME-INR - Abnormal; Notable for the following:    Prothrombin Time 22.4 (*)    All other components within normal limits  LIPASE, BLOOD  TROPONIN I  MAGNESIUM  URINALYSIS COMPLETEWITH MICROSCOPIC ([facility] ONLY)  BASIC METABOLIC PANEL  CBC  PROTIME-INR   ____________________________________________  EKG  ED ECG REPORT I, Latasia Silberstein, the attending physician, personally viewed and interpreted this ECG.   Date: 09/07/2015  EKG Time: 16:44  Rate: 60  Rhythm: either atrial fibrillation or atrial flutter with variable A/V conduction  Axis: left axis deviation  Intervals:abnormal due to atrial fib versus flutter,, QTC unremarkable.  P waves are visible most notably in the V1 rhythm strip and march out consistently at about 300 bpm suggesting a flutter  With variable A/V conduction  ST&T Change: Non-specific ST segment / T-wave changes, but no evidence of acute ischemia.  ____________________________________________  RADIOLOGY   Dg Chest Portable 1 View  09/07/2015  CLINICAL DATA:   Bradycardia and loss of consciousness. EXAM: PORTABLE CHEST 1 VIEW COMPARISON:  05/06/2015 FINDINGS: Portable views of the chest was obtained. Patient is slightly rotated towards the right. Prior surgery in the lower cervical spine. Heart size is grossly stable and within normal limits. Atherosclerotic calcifications involving the thoracic aorta. Stable interstitial or reticular densities in the right lung. No evidence for pulmonary edema or focal airspace disease. Mild elevation of left hemidiaphragm is unchanged. IMPRESSION: No active disease. Electronically Signed   By: Markus Daft M.D.   On: 09/07/2015 18:14    ____________________________________________   PROCEDURES  Procedure(s) performed: None  Critical Care performed: No ____________________________________________   INITIAL IMPRESSION / ASSESSMENT AND PLAN / ED COURSE  Pertinent labs & imaging results that were available during my care of the patient were reviewed by me and considered in my medical decision making (see chart for details).  I am concerned about the patient's  EKG and his symptomatic bradycardia. Although his rate on the EKG is 60 bpm, he is most consistently in the low 40s and occasionally dips down to the low 30s.  Rather than being a simple  Atrial fibrillation with a low rate, it appears to possibly be atrial flutter with variable A/V conduction based on the almost hidden P waves that are at a rate of nearly 300.  Sick sinus syndrome is very possible as well as the possibility of an unspecified heart block.  I spoke by phone with Dr. Nehemiah Massed and we discussed the case. He recommended admitting the patient for monitoring, not giving any additional diltiazem, and cardiology will see him tomorrow. Though he may need a pacemaker eventually Dr. Nehemiah Massed feels we are able to care for him at [facility] rather than sending him to [facility] or another facility. I had his nurse place him on the Zoll aand we are providing  gentle fluids. His labs are unremarkable. I advise the patient and family of the plan and they understand and agree.   ____________________________________________  FINAL CLINICAL IMPRESSION(S) / ED DIAGNOSES  Final diagnoses:  Symptomatic bradycardia  Atrial fibrillation with slow ventricular response (HCC)  Sick sinus syndrome (HCC)  Chronic renal insufficiency, unspecified stage     MEDICATIONS GIVEN DURING THIS VISIT:  Medications  albuterol (PROVENTIL) (2.5 MG/3ML) 0.083% nebulizer solution 3 mL (not administered)  fluticasone furoate-vilanterol (BREO ELLIPTA) 200-25 MCG/INH 1 puff (1 puff Inhalation Not Given 09/07/15 2145)  furosemide (LASIX) tablet 20 mg (20 mg Oral Not Given 09/07/15 2145)  losartan (COZAAR) tablet 25 mg (25 mg Oral Not Given 09/07/15 2145)  sodium chloride flush (NS) 0.9 % injection 3 mL (3 mLs Intravenous Given 09/07/15 2231)  acetaminophen (TYLENOL) tablet 650 mg (not administered)    Or  acetaminophen (TYLENOL) suppository 650 mg (not administered)  bisacodyl (DULCOLAX) EC tablet 5 mg (not administered)  ondansetron (ZOFRAN) tablet 4 mg (not administered)    Or  ondansetron (ZOFRAN) injection 4 mg (not administered)  0.9 %  sodium chloride infusion ( Intravenous New Bag/Given 09/07/15 2230)  metoprolol (LOPRESSOR) injection 5 mg (not administered)  hydrALAZINE (APRESOLINE) injection 5 mg (not administered)  warfarin (COUMADIN) tablet 3 mg (not administered)  warfarin (COUMADIN) tablet 4 mg (not administered)  0.9 %  sodium chloride infusion ( Intravenous Transfusing/Transfer 09/07/15 2038)  warfarin (COUMADIN) tablet 3 mg (3 mg Oral Given 09/07/15 2230)     NEW OUTPATIENT MEDICATIONS STARTED DURING THIS VISIT:  Current Discharge Medication List        Note:  This document was prepared using Dragon voice recognition software and may include unintentional dictation errors.   Hinda Kehr, MD 09/07/15 [phone]

## 2015-09-07 NOTE — ED Notes (Signed)
Pt placed on zoll monitor per MD.

## 2015-09-07 NOTE — H&P (Addendum)
PCP:   Tracie Harrier, MD   Chief Complaint:  Bradycardia  HPI: This is a 82 year old gentleman who was at a routine appointment with his doctor today when he went limp. He did not pass out. He reports that he was short of breath.. He denies any chest pains. Examination in the office revealed bradycardia with a heart rate in the 30s. He was also reportedly hypoxic. He was transported to the ER. In the ER the patient's heart rate had rebounded. The patient felt better. Per family at bedside this occurred once before a few months prior. At that point no cause was identified. The patient has a history of atrial fibrillation on diltiazem. Cardiology Dr. Haze Justin was notified by the ER physician. He recommends holding diltiazem and he will see patient in the a.m.  Review of Systems:  The patient denies anorexia, fever, weight loss,, vision loss, decreased hearing, hoarseness, chest pain, presyncope, dyspnea on exertion, peripheral edema, balance deficits, hemoptysis, abdominal pain, melena, hematochezia, severe indigestion/heartburn, hematuria, incontinence, genital sores, muscle weakness, suspicious skin lesions, transient blindness, difficulty walking, depression, unusual weight change, abnormal bleeding, enlarged lymph nodes, angioedema, and breast masses.  Past Medical History: Past Medical History  Diagnosis Date  . Hypertension   . Stroke ([place])   . Atrial fibrillation [facility])    Past Surgical History  Procedure Laterality Date  . Cardiac surgery    . Back surgery    . Neck surgery      Medications: Prior to Admission medications   Medication Sig Start Date End Date Taking? Authorizing Provider  albuterol (PROVENTIL HFA;VENTOLIN HFA) 108 (90 Base) MCG/ACT inhaler Inhale 2 puffs into the lungs every 6 (six) hours as needed for wheezing or shortness of breath.   Yes Historical Provider, MD  diltiazem (CARDIZEM CD) 120 MG 24 hr capsule Take 120 mg by mouth daily.   Yes Historical Provider,  MD  Fluticasone-Salmeterol (ADVAIR) 250-50 MCG/DOSE AEPB Inhale 1 puff into the lungs 2 (two) times daily.   Yes Historical Provider, MD  furosemide (LASIX) 20 MG tablet Take 20 mg by mouth daily.   Yes Historical Provider, MD  losartan (COZAAR) 25 MG tablet Take 25 mg by mouth daily.   Yes Historical Provider, MD  vitamin B-12 (CYANOCOBALAMIN) 1000 MCG tablet Take 1,000 mcg by mouth daily.   Yes Historical Provider, MD  warfarin (COUMADIN) 3 MG tablet Take 3 mg by mouth every evening. Pt takes Monday-Friday.   Yes Historical Provider, MD  warfarin (COUMADIN) 4 MG tablet Take 4 mg by mouth every evening. Pt takes Saturday and Sunday.   Yes Historical Provider, MD    Allergies:  No Known Allergies  Social History:  reports that he has quit smoking. He does not have any smokeless tobacco history on file. He reports that he does not drink alcohol. His drug history is not on file.  Family History: Coronary artery disease  Physical Exam: Filed Vitals:   09/07/15 1700 09/07/15 1730 09/07/15 1827 09/07/15 1830  BP: 132/71 146/77 157/108 156/106  Pulse: 40 74 68 97  Temp:      TempSrc:      Resp: 22 12 13 15   Height:      Weight:      SpO2: 99% 96% 99% 98%    General:  Alert and oriented times three, well developed and nourished, no acute distress Eyes: PERRLA, pink conjunctiva, no scleral icterus ENT: Moist oral mucosa, neck supple, no thyromegaly Lungs: clear to ascultation, no wheeze, no crackles, no  use of accessory muscles Cardiovascular: irregular rate and rhythm, no regurgitation, no gallops, no murmurs. No carotid bruits, no JVD Abdomen: soft, positive BS, non-tender, non-distended, no organomegaly, not an acute abdomen GU: not examined Neuro: CN II - XII grossly intact, sensation intact Musculoskeletal: strength 1/5 Bilateral lower extremities, 5/5 bilateral upper extremity, no clubbing, cyanosis or edema Skin: no rash, no subcutaneous crepitation, no decubitus Psych:  appropriate patient   Labs on Admission:   Recent Labs  09/07/15 1659  NA 141  K 4.3  CL 110  CO2 24  GLUCOSE 95  BUN 43*  CREATININE 2.90*  CALCIUM 8.8*  MG 2.0    Recent Labs  09/07/15 1659  AST 14*  ALT 10*  ALKPHOS 65  BILITOT 0.4  PROT 7.1  ALBUMIN 3.3*    Recent Labs  09/07/15 1659  LIPASE 22    Recent Labs  09/07/15 1659  WBC 3.3*  NEUTROABS 1.5  HGB 10.5*  HCT 31.1*  MCV 90.5  PLT 165    Recent Labs  09/07/15 1659  TROPONINI <0.03   Invalid input(s): POCBNP No results for input(s): DDIMER in the last 72 hours. No results for input(s): HGBA1C in the last 72 hours. No results for input(s): CHOL, HDL, LDLCALC, TRIG, CHOLHDL, LDLDIRECT in the last 72 hours. No results for input(s): TSH, T4TOTAL, T3FREE, THYROIDAB in the last 72 hours.  Invalid input(s): FREET3 No results for input(s): VITAMINB12, FOLATE, FERRITIN, TIBC, IRON, RETICCTPCT in the last 72 hours.  Micro Results: No results found for this or any previous visit (from the past 240 hour(s)).   Radiological Exams on Admission: Dg Chest Portable 1 View  09/07/2015  CLINICAL DATA:  Bradycardia and loss of consciousness. EXAM: PORTABLE CHEST 1 VIEW COMPARISON:  05/06/2015 FINDINGS: Portable views of the chest was obtained. Patient is slightly rotated towards the right. Prior surgery in the lower cervical spine. Heart size is grossly stable and within normal limits. Atherosclerotic calcifications involving the thoracic aorta. Stable interstitial or reticular densities in the right lung. No evidence for pulmonary edema or focal airspace disease. Mild elevation of left hemidiaphragm is unchanged. IMPRESSION: No active disease. Electronically Signed   By: Markus Daft M.D.   On: 09/07/2015 18:14   EKG: Atrial fibrillation. Heart rates 60  Assessment/Plan Present on Admission:  . Symptomatic bradycardia . Syncope and collapse -Admit to med telemetry -Heart rate has rebounded -Diltiazem has  been held, when necessary Lopressor ordered -Cardiology Dr. Earlean Polka to see patient in a.m.  Atrial fibrillation -Currently stable, resume anticoagulants -PRN Lopressor ordered for heart greater than 100 -See above  HTN Uncontrolled -Stable, home medications resumed -When necessary hydralazine ordered for systolic blood pressure greater than 160  Valve replacement -Family unclear what valve was replaced, anticoagulants continued  Chronic kidney disease -Appears roughly at baseline, we will gently hydrate. Repeat BMP in a.m.  H/o CVA -Stable, aware  Physical deconditioning -Aware  Clifford Coudriet 09/07/2015, 7:29 PM

## 2015-09-07 NOTE — ED Notes (Signed)
Attempted to call report; nurse in a contact room and unable to take report at this time.

## 2015-09-07 NOTE — ED Notes (Signed)
Pt in via triage with complaints of near syncopal episode while at PCP office for regular visit.  Pt reports feeling weaker than normal, denies any other associated symptoms.  Pt bradycardic upon arrival, other vitals WDL, pt A/Ox4.

## 2015-09-07 NOTE — ED Notes (Signed)
Unable to obtain urine specimen at this time; pt states he does not need to go.

## 2015-09-07 NOTE — ED Notes (Signed)
Patient presents to the ED from Dr. Linton Ham office.  Patient was at Dr. Linton Ham office for a routine visit and during the visit patient lost consciousness and his heart rate was 30.  Patient is now alert and oriented but appears pale and states he feels nauseous.  Patient denies pain at this time.

## 2015-09-07 NOTE — Progress Notes (Signed)
ANTICOAGULATION CONSULT NOTE - Initial Consult  Pharmacy Consult for warfarin dosing Indication: atrial fibrillation  No Known Allergies  Patient Measurements: Height: 6\' 5"  (195.6 cm) Weight: 187 lb 3.2 oz (84.913 kg) IBW/kg (Calculated) : 89.1 Heparin Dosing Weight: n/a  Vital Signs: Temp: 98.1 F (36.7 C) (06/12 2123) Temp Source: Oral (06/12 2123) BP: 157/74 mmHg (06/12 2123) Pulse Rate: 84 (06/12 2123)  Labs:  Recent Labs  09/07/15 1659  HGB 10.5*  HCT 31.1*  PLT 165  LABPROT 22.4*  INR 1.98  CREATININE 2.90*  TROPONINI <0.03    Estimated Creatinine Clearance: 23.6 mL/min (by C-G formula based on Cr of 2.9).   Medical History: Past Medical History  Diagnosis Date  . Hypertension   . Stroke ([place])   . Atrial fibrillation (HCC)     Medications:    Assessment:  Goal of Therapy:  Heparin level 0.3-0.7 units/ml    Plan:  Home dose of 3 mg Monday through Friday and 4 mg Saturday and Sunday ordered. F/u INR in AM.  Joson Sapp S 09/07/2015,9:41 PM

## 2015-09-08 DIAGNOSIS — R001 Bradycardia, unspecified: Secondary | ICD-10-CM | POA: Diagnosis present

## 2015-09-08 DIAGNOSIS — I10 Essential (primary) hypertension: Secondary | ICD-10-CM | POA: Diagnosis not present

## 2015-09-08 DIAGNOSIS — I482 Chronic atrial fibrillation: Secondary | ICD-10-CM | POA: Diagnosis not present

## 2015-09-08 DIAGNOSIS — I4891 Unspecified atrial fibrillation: Secondary | ICD-10-CM | POA: Diagnosis not present

## 2015-09-08 DIAGNOSIS — N3000 Acute cystitis without hematuria: Secondary | ICD-10-CM | POA: Diagnosis not present

## 2015-09-08 DIAGNOSIS — I495 Sick sinus syndrome: Secondary | ICD-10-CM | POA: Diagnosis not present

## 2015-09-08 LAB — CBC
HEMATOCRIT: 28.2 % — AB (ref 40.0–52.0)
Hemoglobin: 9.6 g/dL — ABNORMAL LOW (ref 13.0–18.0)
MCH: 30.5 pg (ref 26.0–34.0)
MCHC: 33.9 g/dL (ref 32.0–36.0)
MCV: 90.1 fL (ref 80.0–100.0)
PLATELETS: 140 10*3/uL — AB (ref 150–440)
RBC: 3.13 MIL/uL — ABNORMAL LOW (ref 4.40–5.90)
RDW: 14.4 % (ref 11.5–14.5)
WBC: 4.4 10*3/uL (ref 3.8–10.6)

## 2015-09-08 LAB — URINALYSIS COMPLETE WITH MICROSCOPIC (ARMC ONLY)
Bilirubin Urine: NEGATIVE
Glucose, UA: NEGATIVE mg/dL
Ketones, ur: NEGATIVE mg/dL
NITRITE: POSITIVE — AB
PH: 5 (ref 5.0–8.0)
Protein, ur: 100 mg/dL — AB
SPECIFIC GRAVITY, URINE: 1.011 (ref 1.005–1.030)

## 2015-09-08 LAB — BASIC METABOLIC PANEL
Anion gap: 7 (ref 5–15)
BUN: 40 mg/dL — AB (ref 6–20)
CO2: 24 mmol/L (ref 22–32)
Calcium: 8.5 mg/dL — ABNORMAL LOW (ref 8.9–10.3)
Chloride: 111 mmol/L (ref 101–111)
Creatinine, Ser: 2.73 mg/dL — ABNORMAL HIGH (ref 0.61–1.24)
GFR, EST AFRICAN AMERICAN: 23 mL/min — AB (ref >60)
GFR, EST NON AFRICAN AMERICAN: 20 mL/min — AB (ref >60)
Glucose, Bld: 79 mg/dL (ref 65–99)
POTASSIUM: 4.4 mmol/L (ref 3.5–5.1)
SODIUM: 142 mmol/L (ref 135–145)

## 2015-09-08 LAB — PROTIME-INR
INR: 2.19
Prothrombin Time: 24.2 seconds — ABNORMAL HIGH (ref 11.4–15.0)

## 2015-09-08 MED ORDER — DEXTROSE 5 % IV SOLN
1.0000 g | Freq: Once | INTRAVENOUS | Status: AC
  Administered 2015-09-08: 1 g via INTRAVENOUS
  Filled 2015-09-08: qty 10

## 2015-09-08 MED ORDER — CEPHALEXIN 250 MG PO CAPS: 250.0000 mg | ORAL_CAPSULE | Freq: Three times a day (TID) | ORAL | Status: DC

## 2015-09-08 NOTE — Progress Notes (Signed)
Patient given discharge teaching and paperwork regarding medications, diet, follow-up appointments and activity. Patient understanding verbalized. No complaints at this time. IV and telemetry discontinued prior to leaving. Skin assessment as previously charted and vitals are stable; on room air. Patient being discharged to home with home health. Family present during discharge teaching. No further needs by Care Management. Prescription given to wife. Son helping with transport home since patient is weak - although this is his baseline.

## 2015-09-08 NOTE — Progress Notes (Signed)
Pt urinalysis came back positive. MD paged, awaiting call back. Will continue to monitor.

## 2015-09-08 NOTE — Evaluation (Signed)
Physical Therapy Evaluation Patient Details Name: Kyle Vasquez MRN: [id number] DOB: 02/14/1932 Today's Date: 09/08/2015   History of Present Illness  82 y/o male who was at Dr appointment when he went limp, found to have HR in the 30s.  Feeling better but still weak.  Clinical Impression  Pt is very limited at baseline, essentially w/c bound, but family (son) is able to transfer him with max assist and he is able to get out of the house with some regularity.  Pt does have U&LE weakness, but it does not seem as profound as his limitations are regarding mobility.  He struggled with bed mobility and was unable to get to standing with even considerable assist and raised bed.  Pt/family not interested in rehab, he will benefit from HHPT to increase ability to assist with transfers and mobility.    Follow Up Recommendations Home health PT (very limited, but family feels comfortable taking him home)    Equipment Recommendations       Recommendations for Other Services       Precautions / Restrictions Precautions Precautions: Fall Restrictions Weight Bearing Restrictions: No      Mobility  Bed Mobility Overal bed mobility: Needs Assistance Bed Mobility: Supine to Sit;Sit to Supine     Supine to sit: Mod assist;Max assist Sit to supine: Mod assist   General bed mobility comments: Pt shows good effort but is very limited with what he can do.  Pt leaning back and to the R during sitting and does need assist just to maintain balance multiple times   Transfers Overall transfer level: Needs assistance Equipment used: Rolling walker (2 wheeled) Transfers: Sit to/from Stand Sit to Stand: Total assist         General transfer comment: attempted to get to stand with pt unable to even assist lifting hips an inch.  Raised bed height and heavy assist and pt unable to budge.  It appears pt is near a total assist for sit to stand or to stand pivot transfer.  Ambulation/Gait                 Stairs            Wheelchair Mobility    Modified Rankin (Stroke Patients Only)       Balance                                             Pertinent Vitals/Pain Pain Assessment: 0-10 Pain Score:  (pt reports only some back pain during transition to sitting)    Home Living Family/patient expects to be discharged to:: Private residence Living Arrangements: Spouse/significant other;Children Available Help at Discharge: Family (aide 4 hr for 5 days a week) Type of Home: House Home Access: Ramped entrance       Home Equipment: Wheelchair - power;Wheelchair - manual;Bedside commode      Prior Function Level of Independence: Needs assistance         Comments: Pt has not been able to ambulate in a long time and reports that he is only minimally able to assist his son getting to/from w/c.      Hand Dominance        Extremity/Trunk Assessment   Upper Extremity Assessment: Generalized weakness (R UE grossly 4-/5 (elevation to 120), L grossly 3/5 (~80deg))           Lower  Extremity Assessment: Generalized weakness (R grossly 3-/5, L grossly 3+)         Communication   Communication: No difficulties  Cognition Arousal/Alertness: Awake/alert Behavior During Therapy: WFL for tasks assessed/performed Overall Cognitive Status:  (has some mild baseline dementia)                      General Comments      Exercises        Assessment/Plan    PT Assessment Patient needs continued PT services  PT Diagnosis Generalized weakness   PT Problem List Decreased strength;Decreased activity tolerance;Decreased balance;Decreased range of motion;Decreased mobility;Decreased safety awareness  PT Treatment Interventions DME instruction;Functional mobility training;Therapeutic activities;Therapeutic exercise;Balance training;Neuromuscular re-education;Patient/family education   PT Goals (Current goals can be found in the Care Plan  section) Acute Rehab PT Goals Patient Stated Goal: go home PT Goal Formulation: With patient/family Time For Goal Achievement: 09/15/15 Potential to Achieve Goals: Fair    Frequency Min 2X/week   Barriers to discharge        Co-evaluation               End of Session Equipment Utilized During Treatment: Gait belt Activity Tolerance: Patient limited by fatigue Patient left: with bed alarm set;with family/visitor present;with call bell/phone within reach Nurse Communication: Mobility status    Functional Assessment Tool Used: clinical judgement Functional Limitation: Mobility: Walking and moving around Mobility: Walking and Moving Around Current Status [id number]): At least 80 percent but less than 100 percent impaired, limited or restricted Mobility: Walking and Moving Around Goal Status [phone]): At least 60 percent but less than 80 percent impaired, limited or restricted    Time: 1205-1224 PT Time Calculation (min) (ACUTE ONLY): 19 min   Charges:   PT Evaluation $PT Eval Low Complexity: 1 Procedure     PT G Codes:   PT G-Codes **NOT FOR INPATIENT CLASS** Functional Assessment Tool Used: clinical judgement Functional Limitation: Mobility: Walking and moving around Mobility: Walking and Moving Around Current Status [id number]): At least 80 percent but less than 100 percent impaired, limited or restricted Mobility: Walking and Moving Around Goal Status [id number]): At least 60 percent but less than 80 percent impaired, limited or restricted    Kreg Shropshire, DPT  09/08/2015, 1:34 PM

## 2015-09-08 NOTE — Progress Notes (Signed)
Orthostatics done by [facility] NT, however patient could not tolerate standing even with 2 person assist.

## 2015-09-08 NOTE — Progress Notes (Signed)
Per Dr. Leslye Peer, patient to be discharged with home health. Pt to receive IV ABX and work with PT prior to discharge.

## 2015-09-08 NOTE — Consult Note (Signed)
[facility] Consultation Note  Patient ID: Kyle Vasquez, MRN: [id number], DOB/AGE: [birth year] 80 y.o. Admit date: 09/07/2015   Date of Consult: 09/08/2015 Primary Physician: Tracie Harrier, MD Primary Cardiologist:Fath  Chief Complaint:  Chief Complaint  Patient presents with  . Loss of Consciousness   Reason for Consult: atrial fibrillation with slow ventricular rate and presyncope  HPI: 82 y.o. male with known cardiovascular disease status post apparent coronary bypass graft essential hypertension mixed hyperlipidemia with the chronic atrial fibrillation nonvalvular in nature for the last many years. The patient has done well with appropriate medication management including diltiazem for heart rate control and warfarin for risk reduction of stroke. Recently the patient is weak fatigue and dizziness and have some presyncopal episodes. He is somewhat obtunded when admitted to the hospital with an EKG showing atrial fibrillation with slow ventricular rate and possible sick sinus syndrome. The patient has since had a discontinuation of diltiazem due to concerns of bradycardia and now heart rate is 82 beats minute and slightly better. There is no evidence of further sick sinus syndrome after discontinuation of diltiazem. The patient does have valvular heart disease as well and essential hypertension treated with losartan. There is no evidence of myocardial infarction with a normal troponin but the patient has had a previous stroke for which she had he has not had any significant residual abnormalities.  Past Medical History  Diagnosis Date  . Hypertension   . Stroke ([place])   . Atrial fibrillation [facility])       Surgical History:  Past Surgical History  Procedure Laterality Date  . Cardiac surgery    . Back surgery    . Neck surgery       Home Meds: Prior to Admission medications   Medication Sig Start Date End Date Taking? Authorizing Provider  albuterol (PROVENTIL  HFA;VENTOLIN HFA) 108 (90 Base) MCG/ACT inhaler Inhale 2 puffs into the lungs every 6 (six) hours as needed for wheezing or shortness of breath.   Yes Historical Provider, MD  diltiazem (CARDIZEM CD) 120 MG 24 hr capsule Take 120 mg by mouth daily.   Yes Historical Provider, MD  Fluticasone-Salmeterol (ADVAIR) 250-50 MCG/DOSE AEPB Inhale 1 puff into the lungs 2 (two) times daily.   Yes Historical Provider, MD  furosemide (LASIX) 20 MG tablet Take 20 mg by mouth daily.   Yes Historical Provider, MD  losartan (COZAAR) 25 MG tablet Take 25 mg by mouth daily.   Yes Historical Provider, MD  vitamin B-12 (CYANOCOBALAMIN) 1000 MCG tablet Take 1,000 mcg by mouth daily.   Yes Historical Provider, MD  warfarin (COUMADIN) 3 MG tablet Take 3 mg by mouth every evening. Pt takes Monday-Friday.   Yes Historical Provider, MD  warfarin (COUMADIN) 4 MG tablet Take 4 mg by mouth every evening. Pt takes Saturday and Sunday.   Yes Historical Provider, MD    Inpatient Medications:  . fluticasone furoate-vilanterol  1 puff Inhalation Daily  . furosemide  20 mg Oral Daily  . losartan  25 mg Oral Daily  . sodium chloride flush  3 mL Intravenous Q12H  . warfarin  3 mg Oral Once per day on Mon Tue Wed Thu Fri  . [START ON 09/12/2015] warfarin  4 mg Oral Once per day on Sun Sat   . sodium chloride 50 mL/hr at 09/07/15 2230    Allergies: No Known Allergies  Social History   Social History  . Marital Status: Married    Spouse Name: N/A  . Number  of Children: N/A  . Years of Education: N/A   Occupational History  . Not on file.   Social History Main Topics  . Smoking status: Former Research scientist (life sciences)  . Smokeless tobacco: Not on file  . Alcohol Use: No  . Drug Use: Not on file  . Sexual Activity: Not on file   Other Topics Concern  . Not on file   Social History Narrative     No family history on file.   Review of Systems Positive forPresyncope and dizziness Negative for: General:  chills, fever, night  sweats or weight changes.  Cardiovascular: PND orthopnea positive for syncope dizziness  Dermatological skin lesions rashes Respiratory: Cough congestion Urologic: Frequent urination urination at night and hematuria Abdominal: negative for nausea, vomiting, diarrhea, bright red blood per rectum, melena, or hematemesis Neurologic: negative for visual changes, and/or hearing changes  All other systems reviewed and are otherwise negative except as noted above.  Labs:  Recent Labs  09/07/15 1659  TROPONINI <0.03   Lab Results  Component Value Date   WBC 4.4 09/08/2015   HGB 9.6* 09/08/2015   HCT 28.2* 09/08/2015   MCV 90.1 09/08/2015   PLT 140* 09/08/2015    Recent Labs Lab 09/07/15 1659 09/08/15 0623  NA 141 142  K 4.3 4.4  CL 110 111  CO2 24 24  BUN 43* 40*  CREATININE 2.90* 2.73*  CALCIUM 8.8* 8.5*  PROT 7.1  --   BILITOT 0.4  --   ALKPHOS 65  --   ALT 10*  --   AST 14*  --   GLUCOSE 95 79   No results found for: CHOL, HDL, LDLCALC, TRIG No results found for: DDIMER  Radiology/Studies:  Dg Chest Portable 1 View  09/07/2015  CLINICAL DATA:  Bradycardia and loss of consciousness. EXAM: PORTABLE CHEST 1 VIEW COMPARISON:  05/06/2015 FINDINGS: Portable views of the chest was obtained. Patient is slightly rotated towards the right. Prior surgery in the lower cervical spine. Heart size is grossly stable and within normal limits. Atherosclerotic calcifications involving the thoracic aorta. Stable interstitial or reticular densities in the right lung. No evidence for pulmonary edema or focal airspace disease. Mild elevation of left hemidiaphragm is unchanged. IMPRESSION: No active disease. Electronically Signed   By: Markus Daft M.D.   On: 09/07/2015 18:14    [id number] fibrillation with slow ventricular rate with nonspecific ST and T-wave changes  Weights: Filed Weights   09/07/15 1659 09/07/15 2123  Weight: 192 lb 6.4 oz (87.272 kg) 187 lb 3.2 oz (84.913 kg)      Physical Exam: Blood pressure 148/91, pulse 70, temperature 97.9 F (36.6 C), temperature source Oral, resp. rate 20, height 6\' 5"  (1.956 m), weight 187 lb 3.2 oz (84.913 kg), SpO2 97 %. Body mass index is 22.19 kg/(m^2). General: Well developed, well nourished, in no acute distress. Head eyes ears nose throat: Normocephalic, atraumatic, sclera non-icteric, no xanthomas, nares are without discharge. No apparent thyromegaly and/or mass  Lungs: Normal respiratory effort.  no wheezes, no rales, no rhonchi.  Heart:Irregular irregular with normal S1 S2. no murmur gallop, no rub, PMI is normal size and placement, carotid upstroke normal without bruit, jugular venous pressure is normal Abdomen: Soft, non-tender, non-distended with normoactive bowel sounds. No hepatomegaly. No rebound/guarding. No obvious abdominal masses. Abdominal aorta is normal size without bruit Extremities: Trace edema. no cyanosis, no clubbing, no ulcers  Peripheral : 2+ bilateral upper extremity pulses, 2+ bilateral femoral pulses, 2+ bilateral dorsal pedal pulse Neuro:  Alert and oriented. No facial asymmetry. No focal deficit. Moves all extremities spontaneously. Musculoskeletal: Normal muscle tone without kyphosis Psych:  Responds to questions appropriately with a normal affect.    Assessment: 82 year old male with coronary artery surgery in the remote past essential hypertension makes hyperlipidemia chronic nonvalvular atrial fibrillation with slow ventricular rate causing dizziness and presyncope, currently slightly improved after discontinuation of diltiazem without current evidence of congestive heart failure or myocardial infarction  Plan: 1. Abstain from diltiazem due to significant bradycardia sick sinus syndrome and follow for improvements of symptoms with ambulation today 2. Continue warfarin for further risk reduction in stroke with atrial fibrillation with previous history of stroke 3. Losartan for  hypertension control with a goal systolic blood pressure below 130 mm and increased dose as necessary 4. Furosemide as necessary for lower extremity edema 5. Okay for discharge to home with ambulation if doing fairly well with further outpatient evaluation and reassessment with adjustments and other further evaluation for the possibility of pacemaker placement if necessary  Signed, Corey Skains M.D. [facility] 09/08/2015, 8:17 AM

## 2015-09-08 NOTE — Progress Notes (Signed)
Dr. Leslye Peer rounding now. Aware of positive UA and activity level this morning - PT to work with patient this morning.

## 2015-09-08 NOTE — Progress Notes (Signed)
PT has seen patient and agrees with home health - note to follow this afternoon. Lattie Haw, [place] working on setting up home health. Wife at bedside, said son will be here around 3:30 to take them home.

## 2015-09-08 NOTE — Discharge Instructions (Signed)
°  Stop Cardizem CD (diltiazem)  Bradycardia Bradycardia is a slower-than-normal heart rate. A normal resting heart rate for an adult ranges from 60 to 100 beats per minute. With bradycardia, the resting heart rate is less than 60 beats per minute. Bradycardia is a problem if your heart cannot pump enough oxygen-rich blood through your body. Bradycardia is not a problem for everyone. For some healthy adults, a slow resting heart rate is normal.  CAUSES  Bradycardia may be caused by:  A problem with the heart's electrical system, such as heart block.  A problem with the heart's natural pacemaker (sinus node).  Heart disease, damage, or infection.  Certain medicines that treat heart conditions.  Certain conditions, such as hypothyroidism and obstructive sleep apnea. RISK FACTORS  Risk factors include:  Being 36 or older.  Having high blood pressure (hypertension), high cholesterol (hyperlipidemia), or diabetes.  Drinking heavily, using tobacco products, or using drugs.  Being stressed. SIGNS AND SYMPTOMS  Signs and symptoms include:  Light-headedness.  Faintingor near fainting.  Fatigue and weakness.  Shortness of breath.  Chest pain (angina).  Drowsiness.  Confusion.  Dizziness. DIAGNOSIS  Diagnosis of bradycardia may include:  A physical exam.  An electrocardiogram (ECG).  Blood tests. TREATMENT  Treatment for bradycardia may include:  Treatment of an underlying condition.  Pacemaker placement. A pacemaker is a small, battery-powered device that is placed under the skin and is programmed to sense your heartbeats. If your heart rate is lower than the programmed rate, the pacemaker will pace your heart.  Changing your medicines or dosages. HOME CARE INSTRUCTIONS  Take medicines only as directed by your health care provider.  Manage any health conditions that contribute to bradycardia as directed by your health care provider.  Follow a heart-healthy  diet. A dietitian can help educate you on healthy food options and changes.  Follow an exercise program approved by your health care provider.  Maintain a healthy weight. Lose weight as approved by your health care provider.  Do not use tobacco products, including cigarettes, chewing tobacco, or electronic cigarettes. If you need help quitting, ask your health care provider.  Do not use illegal drugs.  Limit alcohol intake to no more than 1 drink per day for nonpregnant women and 2 drinks per day for men. One drink equals 12 ounces of beer, 5 ounces of wine, or 1 ounces of hard liquor.  Keep all follow-up visits as directed by your health care provider. This is important. SEEK MEDICAL CARE IF:  You feel light-headed or dizzy.  You almost faint.  You feel weak or are easily fatigued during physical activity.  You experience confusion or have memory problems. SEEK IMMEDIATE MEDICAL CARE IF:   You faint.  You have an irregular heartbeat.  You have chest pain.  You have trouble breathing. MAKE SURE YOU:   Understand these instructions.  Will watch your condition.  Will get help right away if you are not doing well or get worse.   This information is not intended to replace advice given to you by your health care provider. Make sure you discuss any questions you have with your health care provider.   Document Released: 12/04/2001 Document Revised: 04/04/2014 Document Reviewed: 06/19/2013 Elsevier Interactive Patient Education [employer].

## 2015-09-08 NOTE — Care Management Obs Status (Signed)
[facility] NOTIFICATION   Patient Details  Name: Kyle Vasquez MRN: [id number] Date of Birth: May 18, 1931   Medicare Observation Status Notification Given:  Yes    Jolly Mango, RN 09/08/2015, 10:10 AM

## 2015-09-08 NOTE — Care Management (Signed)
RNCM assessment for discharge  planning. Patient lives at home with his wife. He has a Publishing rights manager that comes 4 hours per day Monday through Friday. PCP is Dr. Ginette Pitman. Would benefit from SN and PT. Patient is bed to wheelchair bound. He and spouse at agreeable to home health.  No agency preference from list provided. Referral to [facility].

## 2015-09-08 NOTE — Discharge Summary (Signed)
[facility] NAME: Kyle Vasquez    MR#:  [id number]  DATE OF BIRTH:  11/18/1931  DATE OF ADMISSION:  09/07/2015 ADMITTING PHYSICIAN: Quintella Baton, MD  DATE OF DISCHARGE:  09/08/2015  PRIMARY CARE PHYSICIAN: Tracie Harrier, MD    ADMISSION DIAGNOSIS:  Sick sinus syndrome ([place]) [I49.5] Atrial fibrillation with slow ventricular response (HCC) [I48.91] Symptomatic bradycardia [R00.1] Chronic renal insufficiency, unspecified stage [N18.9]  DISCHARGE DIAGNOSIS:  Principal Problem:   Symptomatic bradycardia Active Problems:   Syncope and collapse   Atrial fibrillation (HCC)   HTN (hypertension)   Physical deconditioning   H/O: CVA (cerebrovascular accident)   Bradycardia   SECONDARY DIAGNOSIS:   Past Medical History  Diagnosis Date  . Hypertension   . Stroke ([place])   . Atrial fibrillation ([place])     HOSPITAL COURSE:    1. Symptomatic bradycardia with syncope. Heart rate  In the 30s in the office in 40s initially on presentation to the ER. Diltiazem was held. Heart rate did come up and responded. Patient is feeling well and offers no complaints. Patient will be discharged home with follow-up with Dr. Nehemiah Massed cardiology as outpatient.  2. Atrial fibrillation. Rate better once stopping Cardizem. Follow-up heart rate as outpatient. On Coumadin for anticoagulation. INR therapeutic.  3. History of stroke  4. Essential hypertension continue other medications and we stopped Cardizem CD  5. Acute cystitis with hematuria 1 dose of Rocephin given  Prior to discharge. Keflex upon discharge home. Urine culture added to urine analysis.  6. Chronic kidney disease stage IV. Follow-up with nephrology as outpatient  7. Weakness patient is basically wheelchair-bound and bedbound. Home health set up.  8. Anemia follow-up as outpatient  DISCHARGE CONDITIONS:    satisfactory  CONSULTS OBTAINED:  Treatment Team:  Corey Skains,  MD  DRUG ALLERGIES:  No Known Allergies  DISCHARGE MEDICATIONS:   Current Discharge Medication List    START taking these medications   Details  cephALEXin (KEFLEX) 250 MG capsule Take 1 capsule (250 mg total) by mouth 3 (three) times daily. Qty: 18 capsule, Refills: 0      CONTINUE these medications which have NOT CHANGED   Details  albuterol (PROVENTIL HFA;VENTOLIN HFA) 108 (90 Base) MCG/ACT inhaler Inhale 2 puffs into the lungs every 6 (six) hours as needed for wheezing or shortness of breath.    Fluticasone-Salmeterol (ADVAIR) 250-50 MCG/DOSE AEPB Inhale 1 puff into the lungs 2 (two) times daily.    furosemide (LASIX) 20 MG tablet Take 20 mg by mouth daily.    losartan (COZAAR) 25 MG tablet Take 25 mg by mouth daily.    vitamin B-12 (CYANOCOBALAMIN) 1000 MCG tablet Take 1,000 mcg by mouth daily.    !! warfarin (COUMADIN) 3 MG tablet Take 3 mg by mouth every evening. Pt takes Monday-Friday.    !! warfarin (COUMADIN) 4 MG tablet Take 4 mg by mouth every evening. Pt takes Saturday and Sunday.     !! - Potential duplicate medications found. Please discuss with provider.    STOP taking these medications     diltiazem (CARDIZEM CD) 120 MG 24 hr capsule          DISCHARGE INSTRUCTIONS:    follow-up with Dr. Nehemiah Massed cardiology 1 week  Follow-up with Dr. Ginette Pitman 2 weeks  If you experience worsening of your admission symptoms, develop shortness of breath, life threatening emergency, suicidal or homicidal thoughts you must seek medical attention immediately by calling 911 or  calling your MD immediately  if symptoms less severe.  You Must read complete instructions/literature along with all the possible adverse reactions/side effects for all the Medicines you take and that have been prescribed to you. Take any new Medicines after you have completely understood and accept all the possible adverse reactions/side effects.   Please note  You were cared for by a hospitalist  during your hospital stay. If you have any questions about your discharge medications or the care you received while you were in the hospital after you are discharged, you can call the unit and asked to speak with the hospitalist on call if the hospitalist that took care of you is not available. Once you are discharged, your primary care physician will handle any further medical issues. Please note that NO REFILLS for any discharge medications will be authorized once you are discharged, as it is imperative that you return to your primary care physician (or establish a relationship with a primary care physician if you do not have one) for your aftercare needs so that they can reassess your need for medications and monitor your lab values.    Today   CHIEF COMPLAINT:   Chief Complaint  Patient presents with  . Loss of Consciousness    HISTORY OF PRESENT ILLNESS:  Kyle Vasquez  is a 82 y.o. male  Presents with near syncopal episode and found to have bradycardia   VITAL SIGNS:  Blood pressure 133/67, pulse 68, temperature 97.9 F (36.6 C), temperature source Oral, resp. rate 18, height 6\' 5"  (1.956 m), weight 84.913 kg (187 lb 3.2 oz), SpO2 100 %.    PHYSICAL EXAMINATION:  GENERAL:  82 y.o.-year-old patient lying in the bed with no acute distress.  EYES: Pupils equal, round, reactive to light and accommodation. No scleral icterus. Extraocular muscles intact.  HEENT: Head atraumatic, normocephalic. Oropharynx and nasopharynx clear.  NECK:  Supple, no jugular venous distention. No thyroid enlargement, no tenderness.  LUNGS: Normal breath sounds bilaterally, no wheezing, rales,rhonchi or crepitation. No use of accessory muscles of respiration.  CARDIOVASCULAR: S1, S2  Regular regular. No murmurs, rubs, or gallops.  ABDOMEN: Soft, non-tender, non-distended. Bowel sounds present. No organomegaly or mass.  EXTREMITIES:  2+edema,  No cyanosis, or clubbing.  NEUROLOGIC: Cranial nerves II  through XII are intact. Sensation intact. Gait not checked.  PSYCHIATRIC: The patient is alert and oriented.  SKIN: No obvious rash, lesion, or ulcer.   DATA REVIEW:   CBC  Recent Labs Lab 09/08/15 0623  WBC 4.4  HGB 9.6*  HCT 28.2*  PLT 140*    Chemistries   Recent Labs Lab 09/07/15 1659 09/08/15 0623  NA 141 142  K 4.3 4.4  CL 110 111  CO2 24 24  GLUCOSE 95 79  BUN 43* 40*  CREATININE 2.90* 2.73*  CALCIUM 8.8* 8.5*  MG 2.0  --   AST 14*  --   ALT 10*  --   ALKPHOS 65  --   BILITOT 0.4  --     Cardiac Enzymes  Recent Labs Lab 09/07/15 1659  TROPONINI <0.03     RADIOLOGY:  Dg Chest Portable 1 View  09/07/2015  CLINICAL DATA:  Bradycardia and loss of consciousness. EXAM: PORTABLE CHEST 1 VIEW COMPARISON:  05/06/2015 FINDINGS: Portable views of the chest was obtained. Patient is slightly rotated towards the right. Prior surgery in the lower cervical spine. Heart size is grossly stable and within normal limits. Atherosclerotic calcifications involving the thoracic aorta. Stable interstitial  or reticular densities in the right lung. No evidence for pulmonary edema or focal airspace disease. Mild elevation of left hemidiaphragm is unchanged. IMPRESSION: No active disease. Electronically Signed   By: Markus Daft M.D.   On: 09/07/2015 18:14   Management plans discussed with the patient, family and they are in agreement.  CODE STATUS:     Code Status Orders        Start     Ordered   09/07/15 2132  Full code   Continuous     09/07/15 2131    Code Status History    Date Active Date Inactive Code Status Order ID Comments User Context   This patient has a current code status but no historical code status.      TOTAL TIME TAKING CARE OF THIS PATIENT: 35 minutes.    Loletha Grayer M.D on 09/08/2015 at 3:43 PM  Between 7am to 6pm - Pager - [phone]  After 6pm go to [email or web address] - password [email or web address]  [facility] Office   [phone]  CC: Primary care physician; Tracie Harrier, MD

## 2015-09-09 DIAGNOSIS — I10 Essential (primary) hypertension: Secondary | ICD-10-CM | POA: Diagnosis not present

## 2015-09-10 DIAGNOSIS — Z87891 Personal history of nicotine dependence: Secondary | ICD-10-CM | POA: Diagnosis not present

## 2015-09-10 DIAGNOSIS — I482 Chronic atrial fibrillation: Secondary | ICD-10-CM | POA: Diagnosis not present

## 2015-09-10 DIAGNOSIS — I129 Hypertensive chronic kidney disease with stage 1 through stage 4 chronic kidney disease, or unspecified chronic kidney disease: Secondary | ICD-10-CM | POA: Diagnosis not present

## 2015-09-10 DIAGNOSIS — Z7901 Long term (current) use of anticoagulants: Secondary | ICD-10-CM | POA: Diagnosis not present

## 2015-09-10 DIAGNOSIS — Z8673 Personal history of transient ischemic attack (TIA), and cerebral infarction without residual deficits: Secondary | ICD-10-CM | POA: Diagnosis not present

## 2015-09-10 DIAGNOSIS — I10 Essential (primary) hypertension: Secondary | ICD-10-CM | POA: Diagnosis not present

## 2015-09-10 DIAGNOSIS — I2510 Atherosclerotic heart disease of native coronary artery without angina pectoris: Secondary | ICD-10-CM | POA: Diagnosis not present

## 2015-09-10 DIAGNOSIS — Z7951 Long term (current) use of inhaled steroids: Secondary | ICD-10-CM | POA: Diagnosis not present

## 2015-09-10 DIAGNOSIS — N189 Chronic kidney disease, unspecified: Secondary | ICD-10-CM | POA: Diagnosis not present

## 2015-09-10 DIAGNOSIS — R55 Syncope and collapse: Secondary | ICD-10-CM | POA: Diagnosis not present

## 2015-09-11 DIAGNOSIS — I10 Essential (primary) hypertension: Secondary | ICD-10-CM | POA: Diagnosis not present

## 2015-09-14 DIAGNOSIS — I10 Essential (primary) hypertension: Secondary | ICD-10-CM | POA: Diagnosis not present

## 2015-09-15 DIAGNOSIS — N189 Chronic kidney disease, unspecified: Secondary | ICD-10-CM | POA: Diagnosis not present

## 2015-09-15 DIAGNOSIS — I2510 Atherosclerotic heart disease of native coronary artery without angina pectoris: Secondary | ICD-10-CM | POA: Diagnosis not present

## 2015-09-15 DIAGNOSIS — Z87891 Personal history of nicotine dependence: Secondary | ICD-10-CM | POA: Diagnosis not present

## 2015-09-15 DIAGNOSIS — I129 Hypertensive chronic kidney disease with stage 1 through stage 4 chronic kidney disease, or unspecified chronic kidney disease: Secondary | ICD-10-CM | POA: Diagnosis not present

## 2015-09-15 DIAGNOSIS — I10 Essential (primary) hypertension: Secondary | ICD-10-CM | POA: Diagnosis not present

## 2015-09-15 DIAGNOSIS — Z7901 Long term (current) use of anticoagulants: Secondary | ICD-10-CM | POA: Diagnosis not present

## 2015-09-15 DIAGNOSIS — Z8673 Personal history of transient ischemic attack (TIA), and cerebral infarction without residual deficits: Secondary | ICD-10-CM | POA: Diagnosis not present

## 2015-09-15 DIAGNOSIS — R55 Syncope and collapse: Secondary | ICD-10-CM | POA: Diagnosis not present

## 2015-09-15 DIAGNOSIS — I482 Chronic atrial fibrillation: Secondary | ICD-10-CM | POA: Diagnosis not present

## 2015-09-15 DIAGNOSIS — Z7951 Long term (current) use of inhaled steroids: Secondary | ICD-10-CM | POA: Diagnosis not present

## 2015-09-17 DIAGNOSIS — I10 Essential (primary) hypertension: Secondary | ICD-10-CM | POA: Diagnosis not present

## 2015-09-18 DIAGNOSIS — I2510 Atherosclerotic heart disease of native coronary artery without angina pectoris: Secondary | ICD-10-CM | POA: Diagnosis not present

## 2015-09-18 DIAGNOSIS — Z87891 Personal history of nicotine dependence: Secondary | ICD-10-CM | POA: Diagnosis not present

## 2015-09-18 DIAGNOSIS — Z7951 Long term (current) use of inhaled steroids: Secondary | ICD-10-CM | POA: Diagnosis not present

## 2015-09-18 DIAGNOSIS — Z7901 Long term (current) use of anticoagulants: Secondary | ICD-10-CM | POA: Diagnosis not present

## 2015-09-18 DIAGNOSIS — R55 Syncope and collapse: Secondary | ICD-10-CM | POA: Diagnosis not present

## 2015-09-18 DIAGNOSIS — I10 Essential (primary) hypertension: Secondary | ICD-10-CM | POA: Diagnosis not present

## 2015-09-18 DIAGNOSIS — I482 Chronic atrial fibrillation: Secondary | ICD-10-CM | POA: Diagnosis not present

## 2015-09-18 DIAGNOSIS — I129 Hypertensive chronic kidney disease with stage 1 through stage 4 chronic kidney disease, or unspecified chronic kidney disease: Secondary | ICD-10-CM | POA: Diagnosis not present

## 2015-09-18 DIAGNOSIS — N189 Chronic kidney disease, unspecified: Secondary | ICD-10-CM | POA: Diagnosis not present

## 2015-09-18 DIAGNOSIS — Z8673 Personal history of transient ischemic attack (TIA), and cerebral infarction without residual deficits: Secondary | ICD-10-CM | POA: Diagnosis not present

## 2015-09-21 DIAGNOSIS — I10 Essential (primary) hypertension: Secondary | ICD-10-CM | POA: Diagnosis not present

## 2015-09-22 DIAGNOSIS — N189 Chronic kidney disease, unspecified: Secondary | ICD-10-CM | POA: Diagnosis not present

## 2015-09-22 DIAGNOSIS — Z87891 Personal history of nicotine dependence: Secondary | ICD-10-CM | POA: Diagnosis not present

## 2015-09-22 DIAGNOSIS — I2510 Atherosclerotic heart disease of native coronary artery without angina pectoris: Secondary | ICD-10-CM | POA: Diagnosis not present

## 2015-09-22 DIAGNOSIS — I482 Chronic atrial fibrillation: Secondary | ICD-10-CM | POA: Diagnosis not present

## 2015-09-22 DIAGNOSIS — Z7901 Long term (current) use of anticoagulants: Secondary | ICD-10-CM | POA: Diagnosis not present

## 2015-09-22 DIAGNOSIS — Z8673 Personal history of transient ischemic attack (TIA), and cerebral infarction without residual deficits: Secondary | ICD-10-CM | POA: Diagnosis not present

## 2015-09-22 DIAGNOSIS — I10 Essential (primary) hypertension: Secondary | ICD-10-CM | POA: Diagnosis not present

## 2015-09-22 DIAGNOSIS — I129 Hypertensive chronic kidney disease with stage 1 through stage 4 chronic kidney disease, or unspecified chronic kidney disease: Secondary | ICD-10-CM | POA: Diagnosis not present

## 2015-09-22 DIAGNOSIS — Z7951 Long term (current) use of inhaled steroids: Secondary | ICD-10-CM | POA: Diagnosis not present

## 2015-09-22 DIAGNOSIS — R55 Syncope and collapse: Secondary | ICD-10-CM | POA: Diagnosis not present

## 2015-09-23 DIAGNOSIS — Z7901 Long term (current) use of anticoagulants: Secondary | ICD-10-CM | POA: Diagnosis not present

## 2015-09-23 DIAGNOSIS — Z8673 Personal history of transient ischemic attack (TIA), and cerebral infarction without residual deficits: Secondary | ICD-10-CM | POA: Diagnosis not present

## 2015-09-23 DIAGNOSIS — I2510 Atherosclerotic heart disease of native coronary artery without angina pectoris: Secondary | ICD-10-CM | POA: Diagnosis not present

## 2015-09-23 DIAGNOSIS — I10 Essential (primary) hypertension: Secondary | ICD-10-CM | POA: Diagnosis not present

## 2015-09-23 DIAGNOSIS — Z7951 Long term (current) use of inhaled steroids: Secondary | ICD-10-CM | POA: Diagnosis not present

## 2015-09-23 DIAGNOSIS — Z87891 Personal history of nicotine dependence: Secondary | ICD-10-CM | POA: Diagnosis not present

## 2015-09-23 DIAGNOSIS — I129 Hypertensive chronic kidney disease with stage 1 through stage 4 chronic kidney disease, or unspecified chronic kidney disease: Secondary | ICD-10-CM | POA: Diagnosis not present

## 2015-09-23 DIAGNOSIS — I482 Chronic atrial fibrillation: Secondary | ICD-10-CM | POA: Diagnosis not present

## 2015-09-23 DIAGNOSIS — N189 Chronic kidney disease, unspecified: Secondary | ICD-10-CM | POA: Diagnosis not present

## 2015-09-23 DIAGNOSIS — R55 Syncope and collapse: Secondary | ICD-10-CM | POA: Diagnosis not present

## 2015-09-24 DIAGNOSIS — I10 Essential (primary) hypertension: Secondary | ICD-10-CM | POA: Diagnosis not present

## 2015-09-24 DIAGNOSIS — I129 Hypertensive chronic kidney disease with stage 1 through stage 4 chronic kidney disease, or unspecified chronic kidney disease: Secondary | ICD-10-CM | POA: Diagnosis not present

## 2015-09-24 DIAGNOSIS — Z87891 Personal history of nicotine dependence: Secondary | ICD-10-CM | POA: Diagnosis not present

## 2015-09-24 DIAGNOSIS — N189 Chronic kidney disease, unspecified: Secondary | ICD-10-CM | POA: Diagnosis not present

## 2015-09-24 DIAGNOSIS — I2510 Atherosclerotic heart disease of native coronary artery without angina pectoris: Secondary | ICD-10-CM | POA: Diagnosis not present

## 2015-09-24 DIAGNOSIS — Z7951 Long term (current) use of inhaled steroids: Secondary | ICD-10-CM | POA: Diagnosis not present

## 2015-09-24 DIAGNOSIS — Z7901 Long term (current) use of anticoagulants: Secondary | ICD-10-CM | POA: Diagnosis not present

## 2015-09-24 DIAGNOSIS — Z8673 Personal history of transient ischemic attack (TIA), and cerebral infarction without residual deficits: Secondary | ICD-10-CM | POA: Diagnosis not present

## 2015-09-24 DIAGNOSIS — R55 Syncope and collapse: Secondary | ICD-10-CM | POA: Diagnosis not present

## 2015-09-24 DIAGNOSIS — I482 Chronic atrial fibrillation: Secondary | ICD-10-CM | POA: Diagnosis not present

## 2015-09-25 DIAGNOSIS — I10 Essential (primary) hypertension: Secondary | ICD-10-CM | POA: Diagnosis not present

## 2015-09-28 DIAGNOSIS — I10 Essential (primary) hypertension: Secondary | ICD-10-CM | POA: Diagnosis not present

## 2015-09-29 DIAGNOSIS — I482 Chronic atrial fibrillation: Secondary | ICD-10-CM | POA: Diagnosis not present

## 2015-09-29 DIAGNOSIS — Z8673 Personal history of transient ischemic attack (TIA), and cerebral infarction without residual deficits: Secondary | ICD-10-CM | POA: Diagnosis not present

## 2015-09-29 DIAGNOSIS — R55 Syncope and collapse: Secondary | ICD-10-CM | POA: Diagnosis not present

## 2015-09-29 DIAGNOSIS — I2510 Atherosclerotic heart disease of native coronary artery without angina pectoris: Secondary | ICD-10-CM | POA: Diagnosis not present

## 2015-09-29 DIAGNOSIS — N189 Chronic kidney disease, unspecified: Secondary | ICD-10-CM | POA: Diagnosis not present

## 2015-09-29 DIAGNOSIS — I10 Essential (primary) hypertension: Secondary | ICD-10-CM | POA: Diagnosis not present

## 2015-09-29 DIAGNOSIS — Z87891 Personal history of nicotine dependence: Secondary | ICD-10-CM | POA: Diagnosis not present

## 2015-09-29 DIAGNOSIS — I129 Hypertensive chronic kidney disease with stage 1 through stage 4 chronic kidney disease, or unspecified chronic kidney disease: Secondary | ICD-10-CM | POA: Diagnosis not present

## 2015-09-29 DIAGNOSIS — Z7901 Long term (current) use of anticoagulants: Secondary | ICD-10-CM | POA: Diagnosis not present

## 2015-09-29 DIAGNOSIS — Z7951 Long term (current) use of inhaled steroids: Secondary | ICD-10-CM | POA: Diagnosis not present

## 2015-09-30 DIAGNOSIS — M6281 Muscle weakness (generalized): Secondary | ICD-10-CM | POA: Diagnosis not present

## 2015-09-30 DIAGNOSIS — Z09 Encounter for follow-up examination after completed treatment for conditions other than malignant neoplasm: Secondary | ICD-10-CM | POA: Diagnosis not present

## 2015-09-30 DIAGNOSIS — N183 Chronic kidney disease, stage 3 (moderate): Secondary | ICD-10-CM | POA: Diagnosis not present

## 2015-09-30 DIAGNOSIS — I10 Essential (primary) hypertension: Secondary | ICD-10-CM | POA: Diagnosis not present

## 2015-09-30 DIAGNOSIS — I482 Chronic atrial fibrillation: Secondary | ICD-10-CM | POA: Diagnosis not present

## 2015-09-30 DIAGNOSIS — G603 Idiopathic progressive neuropathy: Secondary | ICD-10-CM | POA: Diagnosis not present

## 2015-09-30 DIAGNOSIS — E7800 Pure hypercholesterolemia, unspecified: Secondary | ICD-10-CM | POA: Diagnosis not present

## 2015-09-30 DIAGNOSIS — R001 Bradycardia, unspecified: Secondary | ICD-10-CM | POA: Diagnosis not present

## 2015-10-01 DIAGNOSIS — Z8673 Personal history of transient ischemic attack (TIA), and cerebral infarction without residual deficits: Secondary | ICD-10-CM | POA: Diagnosis not present

## 2015-10-01 DIAGNOSIS — Z7951 Long term (current) use of inhaled steroids: Secondary | ICD-10-CM | POA: Diagnosis not present

## 2015-10-01 DIAGNOSIS — S020XXA Fracture of vault of skull, initial encounter for closed fracture: Secondary | ICD-10-CM | POA: Diagnosis not present

## 2015-10-01 DIAGNOSIS — Z7901 Long term (current) use of anticoagulants: Secondary | ICD-10-CM | POA: Diagnosis not present

## 2015-10-01 DIAGNOSIS — I2510 Atherosclerotic heart disease of native coronary artery without angina pectoris: Secondary | ICD-10-CM | POA: Diagnosis not present

## 2015-10-01 DIAGNOSIS — I129 Hypertensive chronic kidney disease with stage 1 through stage 4 chronic kidney disease, or unspecified chronic kidney disease: Secondary | ICD-10-CM | POA: Diagnosis not present

## 2015-10-01 DIAGNOSIS — R32 Unspecified urinary incontinence: Secondary | ICD-10-CM | POA: Diagnosis not present

## 2015-10-01 DIAGNOSIS — Z87891 Personal history of nicotine dependence: Secondary | ICD-10-CM | POA: Diagnosis not present

## 2015-10-01 DIAGNOSIS — I482 Chronic atrial fibrillation: Secondary | ICD-10-CM | POA: Diagnosis not present

## 2015-10-01 DIAGNOSIS — I10 Essential (primary) hypertension: Secondary | ICD-10-CM | POA: Diagnosis not present

## 2015-10-01 DIAGNOSIS — N189 Chronic kidney disease, unspecified: Secondary | ICD-10-CM | POA: Diagnosis not present

## 2015-10-01 DIAGNOSIS — R55 Syncope and collapse: Secondary | ICD-10-CM | POA: Diagnosis not present

## 2015-10-02 DIAGNOSIS — I2510 Atherosclerotic heart disease of native coronary artery without angina pectoris: Secondary | ICD-10-CM | POA: Diagnosis not present

## 2015-10-02 DIAGNOSIS — I482 Chronic atrial fibrillation: Secondary | ICD-10-CM | POA: Diagnosis not present

## 2015-10-02 DIAGNOSIS — R55 Syncope and collapse: Secondary | ICD-10-CM | POA: Diagnosis not present

## 2015-10-02 DIAGNOSIS — I10 Essential (primary) hypertension: Secondary | ICD-10-CM | POA: Diagnosis not present

## 2015-10-02 DIAGNOSIS — I129 Hypertensive chronic kidney disease with stage 1 through stage 4 chronic kidney disease, or unspecified chronic kidney disease: Secondary | ICD-10-CM | POA: Diagnosis not present

## 2015-10-02 DIAGNOSIS — Z8673 Personal history of transient ischemic attack (TIA), and cerebral infarction without residual deficits: Secondary | ICD-10-CM | POA: Diagnosis not present

## 2015-10-02 DIAGNOSIS — N189 Chronic kidney disease, unspecified: Secondary | ICD-10-CM | POA: Diagnosis not present

## 2015-10-02 DIAGNOSIS — Z87891 Personal history of nicotine dependence: Secondary | ICD-10-CM | POA: Diagnosis not present

## 2015-10-02 DIAGNOSIS — Z7901 Long term (current) use of anticoagulants: Secondary | ICD-10-CM | POA: Diagnosis not present

## 2015-10-02 DIAGNOSIS — Z7951 Long term (current) use of inhaled steroids: Secondary | ICD-10-CM | POA: Diagnosis not present

## 2015-10-05 DIAGNOSIS — I10 Essential (primary) hypertension: Secondary | ICD-10-CM | POA: Diagnosis not present

## 2015-10-06 DIAGNOSIS — Z7951 Long term (current) use of inhaled steroids: Secondary | ICD-10-CM | POA: Diagnosis not present

## 2015-10-06 DIAGNOSIS — I482 Chronic atrial fibrillation: Secondary | ICD-10-CM | POA: Diagnosis not present

## 2015-10-06 DIAGNOSIS — N189 Chronic kidney disease, unspecified: Secondary | ICD-10-CM | POA: Diagnosis not present

## 2015-10-06 DIAGNOSIS — Z8673 Personal history of transient ischemic attack (TIA), and cerebral infarction without residual deficits: Secondary | ICD-10-CM | POA: Diagnosis not present

## 2015-10-06 DIAGNOSIS — Z87891 Personal history of nicotine dependence: Secondary | ICD-10-CM | POA: Diagnosis not present

## 2015-10-06 DIAGNOSIS — I10 Essential (primary) hypertension: Secondary | ICD-10-CM | POA: Diagnosis not present

## 2015-10-06 DIAGNOSIS — R55 Syncope and collapse: Secondary | ICD-10-CM | POA: Diagnosis not present

## 2015-10-06 DIAGNOSIS — Z7901 Long term (current) use of anticoagulants: Secondary | ICD-10-CM | POA: Diagnosis not present

## 2015-10-06 DIAGNOSIS — E7800 Pure hypercholesterolemia, unspecified: Secondary | ICD-10-CM | POA: Diagnosis not present

## 2015-10-06 DIAGNOSIS — I2510 Atherosclerotic heart disease of native coronary artery without angina pectoris: Secondary | ICD-10-CM | POA: Diagnosis not present

## 2015-10-06 DIAGNOSIS — N183 Chronic kidney disease, stage 3 (moderate): Secondary | ICD-10-CM | POA: Diagnosis not present

## 2015-10-06 DIAGNOSIS — I129 Hypertensive chronic kidney disease with stage 1 through stage 4 chronic kidney disease, or unspecified chronic kidney disease: Secondary | ICD-10-CM | POA: Diagnosis not present

## 2015-10-07 DIAGNOSIS — I10 Essential (primary) hypertension: Secondary | ICD-10-CM | POA: Diagnosis not present

## 2015-10-08 DIAGNOSIS — I10 Essential (primary) hypertension: Secondary | ICD-10-CM | POA: Diagnosis not present

## 2015-10-09 DIAGNOSIS — Z7901 Long term (current) use of anticoagulants: Secondary | ICD-10-CM | POA: Diagnosis not present

## 2015-10-09 DIAGNOSIS — I10 Essential (primary) hypertension: Secondary | ICD-10-CM | POA: Diagnosis not present

## 2015-10-09 DIAGNOSIS — I482 Chronic atrial fibrillation: Secondary | ICD-10-CM | POA: Diagnosis not present

## 2015-10-09 DIAGNOSIS — I129 Hypertensive chronic kidney disease with stage 1 through stage 4 chronic kidney disease, or unspecified chronic kidney disease: Secondary | ICD-10-CM | POA: Diagnosis not present

## 2015-10-09 DIAGNOSIS — Z7951 Long term (current) use of inhaled steroids: Secondary | ICD-10-CM | POA: Diagnosis not present

## 2015-10-09 DIAGNOSIS — Z87891 Personal history of nicotine dependence: Secondary | ICD-10-CM | POA: Diagnosis not present

## 2015-10-09 DIAGNOSIS — R55 Syncope and collapse: Secondary | ICD-10-CM | POA: Diagnosis not present

## 2015-10-09 DIAGNOSIS — N189 Chronic kidney disease, unspecified: Secondary | ICD-10-CM | POA: Diagnosis not present

## 2015-10-09 DIAGNOSIS — Z8673 Personal history of transient ischemic attack (TIA), and cerebral infarction without residual deficits: Secondary | ICD-10-CM | POA: Diagnosis not present

## 2015-10-09 DIAGNOSIS — I2510 Atherosclerotic heart disease of native coronary artery without angina pectoris: Secondary | ICD-10-CM | POA: Diagnosis not present

## 2015-10-12 DIAGNOSIS — I10 Essential (primary) hypertension: Secondary | ICD-10-CM | POA: Diagnosis not present

## 2015-10-13 DIAGNOSIS — I129 Hypertensive chronic kidney disease with stage 1 through stage 4 chronic kidney disease, or unspecified chronic kidney disease: Secondary | ICD-10-CM | POA: Diagnosis not present

## 2015-10-13 DIAGNOSIS — R55 Syncope and collapse: Secondary | ICD-10-CM | POA: Diagnosis not present

## 2015-10-13 DIAGNOSIS — Z87891 Personal history of nicotine dependence: Secondary | ICD-10-CM | POA: Diagnosis not present

## 2015-10-13 DIAGNOSIS — N189 Chronic kidney disease, unspecified: Secondary | ICD-10-CM | POA: Diagnosis not present

## 2015-10-13 DIAGNOSIS — I482 Chronic atrial fibrillation: Secondary | ICD-10-CM | POA: Diagnosis not present

## 2015-10-13 DIAGNOSIS — Z8673 Personal history of transient ischemic attack (TIA), and cerebral infarction without residual deficits: Secondary | ICD-10-CM | POA: Diagnosis not present

## 2015-10-13 DIAGNOSIS — I10 Essential (primary) hypertension: Secondary | ICD-10-CM | POA: Diagnosis not present

## 2015-10-13 DIAGNOSIS — I2510 Atherosclerotic heart disease of native coronary artery without angina pectoris: Secondary | ICD-10-CM | POA: Diagnosis not present

## 2015-10-13 DIAGNOSIS — Z7951 Long term (current) use of inhaled steroids: Secondary | ICD-10-CM | POA: Diagnosis not present

## 2015-10-13 DIAGNOSIS — Z7901 Long term (current) use of anticoagulants: Secondary | ICD-10-CM | POA: Diagnosis not present

## 2015-10-14 DIAGNOSIS — I10 Essential (primary) hypertension: Secondary | ICD-10-CM | POA: Diagnosis not present

## 2015-10-15 DIAGNOSIS — Z87891 Personal history of nicotine dependence: Secondary | ICD-10-CM | POA: Diagnosis not present

## 2015-10-15 DIAGNOSIS — N189 Chronic kidney disease, unspecified: Secondary | ICD-10-CM | POA: Diagnosis not present

## 2015-10-15 DIAGNOSIS — R55 Syncope and collapse: Secondary | ICD-10-CM | POA: Diagnosis not present

## 2015-10-15 DIAGNOSIS — Z7951 Long term (current) use of inhaled steroids: Secondary | ICD-10-CM | POA: Diagnosis not present

## 2015-10-15 DIAGNOSIS — Z8673 Personal history of transient ischemic attack (TIA), and cerebral infarction without residual deficits: Secondary | ICD-10-CM | POA: Diagnosis not present

## 2015-10-15 DIAGNOSIS — I10 Essential (primary) hypertension: Secondary | ICD-10-CM | POA: Diagnosis not present

## 2015-10-15 DIAGNOSIS — I2510 Atherosclerotic heart disease of native coronary artery without angina pectoris: Secondary | ICD-10-CM | POA: Diagnosis not present

## 2015-10-15 DIAGNOSIS — I482 Chronic atrial fibrillation: Secondary | ICD-10-CM | POA: Diagnosis not present

## 2015-10-15 DIAGNOSIS — Z7901 Long term (current) use of anticoagulants: Secondary | ICD-10-CM | POA: Diagnosis not present

## 2015-10-15 DIAGNOSIS — I129 Hypertensive chronic kidney disease with stage 1 through stage 4 chronic kidney disease, or unspecified chronic kidney disease: Secondary | ICD-10-CM | POA: Diagnosis not present

## 2015-10-16 DIAGNOSIS — I10 Essential (primary) hypertension: Secondary | ICD-10-CM | POA: Diagnosis not present

## 2015-10-19 DIAGNOSIS — I10 Essential (primary) hypertension: Secondary | ICD-10-CM | POA: Diagnosis not present

## 2015-10-20 DIAGNOSIS — I10 Essential (primary) hypertension: Secondary | ICD-10-CM | POA: Diagnosis not present

## 2015-10-21 DIAGNOSIS — I482 Chronic atrial fibrillation: Secondary | ICD-10-CM | POA: Diagnosis not present

## 2015-10-21 DIAGNOSIS — Z7951 Long term (current) use of inhaled steroids: Secondary | ICD-10-CM | POA: Diagnosis not present

## 2015-10-21 DIAGNOSIS — I10 Essential (primary) hypertension: Secondary | ICD-10-CM | POA: Diagnosis not present

## 2015-10-21 DIAGNOSIS — R55 Syncope and collapse: Secondary | ICD-10-CM | POA: Diagnosis not present

## 2015-10-21 DIAGNOSIS — N189 Chronic kidney disease, unspecified: Secondary | ICD-10-CM | POA: Diagnosis not present

## 2015-10-21 DIAGNOSIS — I129 Hypertensive chronic kidney disease with stage 1 through stage 4 chronic kidney disease, or unspecified chronic kidney disease: Secondary | ICD-10-CM | POA: Diagnosis not present

## 2015-10-21 DIAGNOSIS — Z7901 Long term (current) use of anticoagulants: Secondary | ICD-10-CM | POA: Diagnosis not present

## 2015-10-21 DIAGNOSIS — Z8673 Personal history of transient ischemic attack (TIA), and cerebral infarction without residual deficits: Secondary | ICD-10-CM | POA: Diagnosis not present

## 2015-10-21 DIAGNOSIS — I2510 Atherosclerotic heart disease of native coronary artery without angina pectoris: Secondary | ICD-10-CM | POA: Diagnosis not present

## 2015-10-21 DIAGNOSIS — Z87891 Personal history of nicotine dependence: Secondary | ICD-10-CM | POA: Diagnosis not present

## 2015-10-22 DIAGNOSIS — I2510 Atherosclerotic heart disease of native coronary artery without angina pectoris: Secondary | ICD-10-CM | POA: Diagnosis not present

## 2015-10-22 DIAGNOSIS — R55 Syncope and collapse: Secondary | ICD-10-CM | POA: Diagnosis not present

## 2015-10-22 DIAGNOSIS — I10 Essential (primary) hypertension: Secondary | ICD-10-CM | POA: Diagnosis not present

## 2015-10-22 DIAGNOSIS — I482 Chronic atrial fibrillation: Secondary | ICD-10-CM | POA: Diagnosis not present

## 2015-10-22 DIAGNOSIS — I129 Hypertensive chronic kidney disease with stage 1 through stage 4 chronic kidney disease, or unspecified chronic kidney disease: Secondary | ICD-10-CM | POA: Diagnosis not present

## 2015-10-22 DIAGNOSIS — Z8673 Personal history of transient ischemic attack (TIA), and cerebral infarction without residual deficits: Secondary | ICD-10-CM | POA: Diagnosis not present

## 2015-10-22 DIAGNOSIS — Z7901 Long term (current) use of anticoagulants: Secondary | ICD-10-CM | POA: Diagnosis not present

## 2015-10-22 DIAGNOSIS — N189 Chronic kidney disease, unspecified: Secondary | ICD-10-CM | POA: Diagnosis not present

## 2015-10-22 DIAGNOSIS — Z87891 Personal history of nicotine dependence: Secondary | ICD-10-CM | POA: Diagnosis not present

## 2015-10-22 DIAGNOSIS — Z7951 Long term (current) use of inhaled steroids: Secondary | ICD-10-CM | POA: Diagnosis not present

## 2015-10-23 DIAGNOSIS — I10 Essential (primary) hypertension: Secondary | ICD-10-CM | POA: Diagnosis not present

## 2015-10-26 DIAGNOSIS — I10 Essential (primary) hypertension: Secondary | ICD-10-CM | POA: Diagnosis not present

## 2015-10-27 DIAGNOSIS — Z87891 Personal history of nicotine dependence: Secondary | ICD-10-CM | POA: Diagnosis not present

## 2015-10-27 DIAGNOSIS — I129 Hypertensive chronic kidney disease with stage 1 through stage 4 chronic kidney disease, or unspecified chronic kidney disease: Secondary | ICD-10-CM | POA: Diagnosis not present

## 2015-10-27 DIAGNOSIS — I2510 Atherosclerotic heart disease of native coronary artery without angina pectoris: Secondary | ICD-10-CM | POA: Diagnosis not present

## 2015-10-27 DIAGNOSIS — Z7901 Long term (current) use of anticoagulants: Secondary | ICD-10-CM | POA: Diagnosis not present

## 2015-10-27 DIAGNOSIS — I482 Chronic atrial fibrillation: Secondary | ICD-10-CM | POA: Diagnosis not present

## 2015-10-27 DIAGNOSIS — Z7951 Long term (current) use of inhaled steroids: Secondary | ICD-10-CM | POA: Diagnosis not present

## 2015-10-27 DIAGNOSIS — I10 Essential (primary) hypertension: Secondary | ICD-10-CM | POA: Diagnosis not present

## 2015-10-27 DIAGNOSIS — R55 Syncope and collapse: Secondary | ICD-10-CM | POA: Diagnosis not present

## 2015-10-27 DIAGNOSIS — N189 Chronic kidney disease, unspecified: Secondary | ICD-10-CM | POA: Diagnosis not present

## 2015-10-27 DIAGNOSIS — Z8673 Personal history of transient ischemic attack (TIA), and cerebral infarction without residual deficits: Secondary | ICD-10-CM | POA: Diagnosis not present

## 2015-10-28 DIAGNOSIS — I10 Essential (primary) hypertension: Secondary | ICD-10-CM | POA: Diagnosis not present

## 2015-10-29 DIAGNOSIS — I10 Essential (primary) hypertension: Secondary | ICD-10-CM | POA: Diagnosis not present

## 2015-10-30 DIAGNOSIS — I10 Essential (primary) hypertension: Secondary | ICD-10-CM | POA: Diagnosis not present

## 2015-11-01 DIAGNOSIS — R32 Unspecified urinary incontinence: Secondary | ICD-10-CM | POA: Diagnosis not present

## 2015-11-01 DIAGNOSIS — S020XXA Fracture of vault of skull, initial encounter for closed fracture: Secondary | ICD-10-CM | POA: Diagnosis not present

## 2015-11-02 DIAGNOSIS — I10 Essential (primary) hypertension: Secondary | ICD-10-CM | POA: Diagnosis not present

## 2015-11-03 DIAGNOSIS — I10 Essential (primary) hypertension: Secondary | ICD-10-CM | POA: Diagnosis not present

## 2015-11-04 DIAGNOSIS — I10 Essential (primary) hypertension: Secondary | ICD-10-CM | POA: Diagnosis not present

## 2015-11-05 DIAGNOSIS — Z7951 Long term (current) use of inhaled steroids: Secondary | ICD-10-CM | POA: Diagnosis not present

## 2015-11-05 DIAGNOSIS — Z8673 Personal history of transient ischemic attack (TIA), and cerebral infarction without residual deficits: Secondary | ICD-10-CM | POA: Diagnosis not present

## 2015-11-05 DIAGNOSIS — I2510 Atherosclerotic heart disease of native coronary artery without angina pectoris: Secondary | ICD-10-CM | POA: Diagnosis not present

## 2015-11-05 DIAGNOSIS — Z87891 Personal history of nicotine dependence: Secondary | ICD-10-CM | POA: Diagnosis not present

## 2015-11-05 DIAGNOSIS — Z7901 Long term (current) use of anticoagulants: Secondary | ICD-10-CM | POA: Diagnosis not present

## 2015-11-05 DIAGNOSIS — I10 Essential (primary) hypertension: Secondary | ICD-10-CM | POA: Diagnosis not present

## 2015-11-05 DIAGNOSIS — I129 Hypertensive chronic kidney disease with stage 1 through stage 4 chronic kidney disease, or unspecified chronic kidney disease: Secondary | ICD-10-CM | POA: Diagnosis not present

## 2015-11-05 DIAGNOSIS — R55 Syncope and collapse: Secondary | ICD-10-CM | POA: Diagnosis not present

## 2015-11-05 DIAGNOSIS — N189 Chronic kidney disease, unspecified: Secondary | ICD-10-CM | POA: Diagnosis not present

## 2015-11-05 DIAGNOSIS — I482 Chronic atrial fibrillation: Secondary | ICD-10-CM | POA: Diagnosis not present

## 2015-11-06 DIAGNOSIS — I10 Essential (primary) hypertension: Secondary | ICD-10-CM | POA: Diagnosis not present

## 2015-11-09 DIAGNOSIS — I10 Essential (primary) hypertension: Secondary | ICD-10-CM | POA: Diagnosis not present

## 2015-11-10 DIAGNOSIS — I10 Essential (primary) hypertension: Secondary | ICD-10-CM | POA: Diagnosis not present

## 2015-11-11 DIAGNOSIS — I10 Essential (primary) hypertension: Secondary | ICD-10-CM | POA: Diagnosis not present

## 2015-11-12 DIAGNOSIS — I509 Heart failure, unspecified: Secondary | ICD-10-CM | POA: Diagnosis not present

## 2015-11-12 DIAGNOSIS — I4891 Unspecified atrial fibrillation: Secondary | ICD-10-CM | POA: Diagnosis not present

## 2015-11-12 DIAGNOSIS — I10 Essential (primary) hypertension: Secondary | ICD-10-CM | POA: Diagnosis not present

## 2015-11-12 DIAGNOSIS — J449 Chronic obstructive pulmonary disease, unspecified: Secondary | ICD-10-CM | POA: Diagnosis not present

## 2015-11-13 DIAGNOSIS — I10 Essential (primary) hypertension: Secondary | ICD-10-CM | POA: Diagnosis not present

## 2015-11-16 DIAGNOSIS — I10 Essential (primary) hypertension: Secondary | ICD-10-CM | POA: Diagnosis not present

## 2015-11-17 DIAGNOSIS — I10 Essential (primary) hypertension: Secondary | ICD-10-CM | POA: Diagnosis not present

## 2015-11-18 DIAGNOSIS — I10 Essential (primary) hypertension: Secondary | ICD-10-CM | POA: Diagnosis not present

## 2015-11-19 DIAGNOSIS — I10 Essential (primary) hypertension: Secondary | ICD-10-CM | POA: Diagnosis not present

## 2015-11-20 DIAGNOSIS — I10 Essential (primary) hypertension: Secondary | ICD-10-CM | POA: Diagnosis not present

## 2015-11-23 DIAGNOSIS — I10 Essential (primary) hypertension: Secondary | ICD-10-CM | POA: Diagnosis not present

## 2015-11-24 DIAGNOSIS — I10 Essential (primary) hypertension: Secondary | ICD-10-CM | POA: Diagnosis not present

## 2015-11-25 DIAGNOSIS — I10 Essential (primary) hypertension: Secondary | ICD-10-CM | POA: Diagnosis not present

## 2015-11-26 DIAGNOSIS — I10 Essential (primary) hypertension: Secondary | ICD-10-CM | POA: Diagnosis not present

## 2015-11-27 DIAGNOSIS — I10 Essential (primary) hypertension: Secondary | ICD-10-CM | POA: Diagnosis not present

## 2015-11-30 DIAGNOSIS — I10 Essential (primary) hypertension: Secondary | ICD-10-CM | POA: Diagnosis not present

## 2015-12-01 DIAGNOSIS — I10 Essential (primary) hypertension: Secondary | ICD-10-CM | POA: Diagnosis not present

## 2015-12-02 DIAGNOSIS — I10 Essential (primary) hypertension: Secondary | ICD-10-CM | POA: Diagnosis not present

## 2015-12-03 DIAGNOSIS — I10 Essential (primary) hypertension: Secondary | ICD-10-CM | POA: Diagnosis not present

## 2015-12-03 DIAGNOSIS — Z7901 Long term (current) use of anticoagulants: Secondary | ICD-10-CM | POA: Diagnosis not present

## 2015-12-04 DIAGNOSIS — I10 Essential (primary) hypertension: Secondary | ICD-10-CM | POA: Diagnosis not present

## 2015-12-07 DIAGNOSIS — S020XXA Fracture of vault of skull, initial encounter for closed fracture: Secondary | ICD-10-CM | POA: Diagnosis not present

## 2015-12-07 DIAGNOSIS — I10 Essential (primary) hypertension: Secondary | ICD-10-CM | POA: Diagnosis not present

## 2015-12-07 DIAGNOSIS — R32 Unspecified urinary incontinence: Secondary | ICD-10-CM | POA: Diagnosis not present

## 2015-12-08 DIAGNOSIS — I10 Essential (primary) hypertension: Secondary | ICD-10-CM | POA: Diagnosis not present

## 2015-12-09 DIAGNOSIS — I10 Essential (primary) hypertension: Secondary | ICD-10-CM | POA: Diagnosis not present

## 2015-12-10 DIAGNOSIS — I10 Essential (primary) hypertension: Secondary | ICD-10-CM | POA: Diagnosis not present

## 2015-12-11 DIAGNOSIS — I10 Essential (primary) hypertension: Secondary | ICD-10-CM | POA: Diagnosis not present

## 2015-12-13 DIAGNOSIS — I509 Heart failure, unspecified: Secondary | ICD-10-CM | POA: Diagnosis not present

## 2015-12-13 DIAGNOSIS — I10 Essential (primary) hypertension: Secondary | ICD-10-CM | POA: Diagnosis not present

## 2015-12-13 DIAGNOSIS — J449 Chronic obstructive pulmonary disease, unspecified: Secondary | ICD-10-CM | POA: Diagnosis not present

## 2015-12-13 DIAGNOSIS — I4891 Unspecified atrial fibrillation: Secondary | ICD-10-CM | POA: Diagnosis not present

## 2015-12-14 DIAGNOSIS — I10 Essential (primary) hypertension: Secondary | ICD-10-CM | POA: Diagnosis not present

## 2015-12-15 DIAGNOSIS — I10 Essential (primary) hypertension: Secondary | ICD-10-CM | POA: Diagnosis not present

## 2015-12-16 DIAGNOSIS — I10 Essential (primary) hypertension: Secondary | ICD-10-CM | POA: Diagnosis not present

## 2015-12-17 DIAGNOSIS — I10 Essential (primary) hypertension: Secondary | ICD-10-CM | POA: Diagnosis not present

## 2015-12-18 DIAGNOSIS — I10 Essential (primary) hypertension: Secondary | ICD-10-CM | POA: Diagnosis not present

## 2015-12-21 DIAGNOSIS — I10 Essential (primary) hypertension: Secondary | ICD-10-CM | POA: Diagnosis not present

## 2015-12-22 DIAGNOSIS — I10 Essential (primary) hypertension: Secondary | ICD-10-CM | POA: Diagnosis not present

## 2015-12-23 DIAGNOSIS — M6281 Muscle weakness (generalized): Secondary | ICD-10-CM | POA: Diagnosis not present

## 2015-12-23 DIAGNOSIS — E7800 Pure hypercholesterolemia, unspecified: Secondary | ICD-10-CM | POA: Diagnosis not present

## 2015-12-23 DIAGNOSIS — Z7901 Long term (current) use of anticoagulants: Secondary | ICD-10-CM | POA: Diagnosis not present

## 2015-12-23 DIAGNOSIS — R001 Bradycardia, unspecified: Secondary | ICD-10-CM | POA: Diagnosis not present

## 2015-12-23 DIAGNOSIS — I482 Chronic atrial fibrillation: Secondary | ICD-10-CM | POA: Diagnosis not present

## 2015-12-23 DIAGNOSIS — Z09 Encounter for follow-up examination after completed treatment for conditions other than malignant neoplasm: Secondary | ICD-10-CM | POA: Diagnosis not present

## 2015-12-23 DIAGNOSIS — I10 Essential (primary) hypertension: Secondary | ICD-10-CM | POA: Diagnosis not present

## 2015-12-23 DIAGNOSIS — G603 Idiopathic progressive neuropathy: Secondary | ICD-10-CM | POA: Diagnosis not present

## 2015-12-24 DIAGNOSIS — I10 Essential (primary) hypertension: Secondary | ICD-10-CM | POA: Diagnosis not present

## 2015-12-25 DIAGNOSIS — I10 Essential (primary) hypertension: Secondary | ICD-10-CM | POA: Diagnosis not present

## 2015-12-28 DIAGNOSIS — I10 Essential (primary) hypertension: Secondary | ICD-10-CM | POA: Diagnosis not present

## 2015-12-29 DIAGNOSIS — I10 Essential (primary) hypertension: Secondary | ICD-10-CM | POA: Diagnosis not present

## 2015-12-30 DIAGNOSIS — Z0000 Encounter for general adult medical examination without abnormal findings: Secondary | ICD-10-CM | POA: Diagnosis not present

## 2015-12-30 DIAGNOSIS — N184 Chronic kidney disease, stage 4 (severe): Secondary | ICD-10-CM | POA: Diagnosis not present

## 2015-12-30 DIAGNOSIS — Z23 Encounter for immunization: Secondary | ICD-10-CM | POA: Diagnosis not present

## 2015-12-30 DIAGNOSIS — I10 Essential (primary) hypertension: Secondary | ICD-10-CM | POA: Diagnosis not present

## 2015-12-30 DIAGNOSIS — D61818 Other pancytopenia: Secondary | ICD-10-CM | POA: Diagnosis not present

## 2015-12-30 DIAGNOSIS — Z7901 Long term (current) use of anticoagulants: Secondary | ICD-10-CM | POA: Diagnosis not present

## 2015-12-30 DIAGNOSIS — J42 Unspecified chronic bronchitis: Secondary | ICD-10-CM | POA: Diagnosis not present

## 2015-12-30 DIAGNOSIS — Z8673 Personal history of transient ischemic attack (TIA), and cerebral infarction without residual deficits: Secondary | ICD-10-CM | POA: Diagnosis not present

## 2015-12-30 DIAGNOSIS — G603 Idiopathic progressive neuropathy: Secondary | ICD-10-CM | POA: Diagnosis not present

## 2015-12-31 DIAGNOSIS — I10 Essential (primary) hypertension: Secondary | ICD-10-CM | POA: Diagnosis not present

## 2016-01-01 DIAGNOSIS — I10 Essential (primary) hypertension: Secondary | ICD-10-CM | POA: Diagnosis not present

## 2016-01-01 DIAGNOSIS — R32 Unspecified urinary incontinence: Secondary | ICD-10-CM | POA: Diagnosis not present

## 2016-01-01 DIAGNOSIS — S020XXA Fracture of vault of skull, initial encounter for closed fracture: Secondary | ICD-10-CM | POA: Diagnosis not present

## 2016-01-04 DIAGNOSIS — I10 Essential (primary) hypertension: Secondary | ICD-10-CM | POA: Diagnosis not present

## 2016-01-05 DIAGNOSIS — I10 Essential (primary) hypertension: Secondary | ICD-10-CM | POA: Diagnosis not present

## 2016-01-06 ENCOUNTER — Encounter: Payer: Self-pay | Admitting: Emergency Medicine

## 2016-01-06 ENCOUNTER — Emergency Department: Payer: Commercial Managed Care - HMO

## 2016-01-06 ENCOUNTER — Emergency Department
Admission: EM | Admit: 2016-01-06 | Discharge: 2016-01-06 | Disposition: A | Discharge status: Home / Self Care | Payer: Commercial Managed Care - HMO | Attending: Emergency Medicine | Admitting: Emergency Medicine

## 2016-01-06 DIAGNOSIS — R55 Syncope and collapse: Secondary | ICD-10-CM | POA: Insufficient documentation

## 2016-01-06 DIAGNOSIS — F0280 Dementia in other diseases classified elsewhere without behavioral disturbance: Secondary | ICD-10-CM | POA: Diagnosis not present

## 2016-01-06 DIAGNOSIS — Z79899 Other long term (current) drug therapy: Secondary | ICD-10-CM | POA: Insufficient documentation

## 2016-01-06 DIAGNOSIS — I10 Essential (primary) hypertension: Secondary | ICD-10-CM | POA: Insufficient documentation

## 2016-01-06 DIAGNOSIS — Z87891 Personal history of nicotine dependence: Secondary | ICD-10-CM | POA: Diagnosis not present

## 2016-01-06 DIAGNOSIS — I482 Chronic atrial fibrillation: Secondary | ICD-10-CM | POA: Diagnosis not present

## 2016-01-06 DIAGNOSIS — Z7901 Long term (current) use of anticoagulants: Secondary | ICD-10-CM | POA: Insufficient documentation

## 2016-01-06 DIAGNOSIS — E7800 Pure hypercholesterolemia, unspecified: Secondary | ICD-10-CM | POA: Diagnosis not present

## 2016-01-06 DIAGNOSIS — R4182 Altered mental status, unspecified: Secondary | ICD-10-CM | POA: Diagnosis present

## 2016-01-06 DIAGNOSIS — N183 Chronic kidney disease, stage 3 (moderate): Secondary | ICD-10-CM | POA: Diagnosis not present

## 2016-01-06 DIAGNOSIS — G309 Alzheimer's disease, unspecified: Secondary | ICD-10-CM | POA: Diagnosis not present

## 2016-01-06 LAB — CBC
HEMATOCRIT: 31.9 % — AB (ref 40.0–52.0)
Hemoglobin: 10.6 g/dL — ABNORMAL LOW (ref 13.0–18.0)
MCH: 30.4 pg (ref 26.0–34.0)
MCHC: 33.1 g/dL (ref 32.0–36.0)
MCV: 91.9 fL (ref 80.0–100.0)
PLATELETS: 157 10*3/uL (ref 150–440)
RBC: 3.47 MIL/uL — AB (ref 4.40–5.90)
RDW: 14.2 % (ref 11.5–14.5)
WBC: 3 10*3/uL — ABNORMAL LOW (ref 3.8–10.6)

## 2016-01-06 LAB — PROTIME-INR
INR: 2.05
Prothrombin Time: 23.4 seconds — ABNORMAL HIGH (ref 11.4–15.2)

## 2016-01-06 LAB — COMPREHENSIVE METABOLIC PANEL
ALBUMIN: 3.3 g/dL — AB (ref 3.5–5.0)
ALK PHOS: 59 U/L (ref 38–126)
ALT: 9 U/L — AB (ref 17–63)
AST: 16 U/L (ref 15–41)
Anion gap: 5 (ref 5–15)
BUN: 44 mg/dL — AB (ref 6–20)
CALCIUM: 8.9 mg/dL (ref 8.9–10.3)
CHLORIDE: 110 mmol/L (ref 101–111)
CO2: 27 mmol/L (ref 22–32)
CREATININE: 2.9 mg/dL — AB (ref 0.61–1.24)
GFR calc Af Amer: 22 mL/min — ABNORMAL LOW (ref >60)
GFR calc non Af Amer: 19 mL/min — ABNORMAL LOW (ref >60)
GLUCOSE: 103 mg/dL — AB (ref 65–99)
Potassium: 4.4 mmol/L (ref 3.5–5.1)
SODIUM: 142 mmol/L (ref 135–145)
Total Bilirubin: <0.1 mg/dL — ABNORMAL LOW (ref 0.3–1.2)
Total Protein: 7.1 g/dL (ref 6.5–8.1)

## 2016-01-06 LAB — MAGNESIUM: Magnesium: 2.1 mg/dL (ref 1.7–2.4)

## 2016-01-06 LAB — URINALYSIS COMPLETE WITH MICROSCOPIC (ARMC ONLY)
Bilirubin Urine: NEGATIVE
GLUCOSE, UA: NEGATIVE mg/dL
Ketones, ur: NEGATIVE mg/dL
LEUKOCYTES UA: NEGATIVE
NITRITE: NEGATIVE
PROTEIN: 100 mg/dL — AB
SPECIFIC GRAVITY, URINE: 1.012 (ref 1.005–1.030)
pH: 5 (ref 5.0–8.0)

## 2016-01-06 LAB — GLUCOSE, CAPILLARY: GLUCOSE-CAPILLARY: 92 mg/dL (ref 65–99)

## 2016-01-06 NOTE — ED Notes (Signed)
In and out cath completed by this RN assisted by [place], NT. Patient tolerated well. Clear, yellow urine returned. Sterile technique maintained.

## 2016-01-06 NOTE — ED Triage Notes (Signed)
Pt to ed with c/o weakness and syncopal episode while at dr faths office today.  Pt reports he feels tired and lethargic but denies pain at this time.  Pt falling asleep at triage at this time. Family with pt.

## 2016-01-06 NOTE — Discharge Instructions (Signed)
As we discussed, your workup today was reassuring.  Though we do not know exactly what is causing your symptoms, it appears that you have no emergent medical condition at this time and that you are safe to go home and follow up as recommended in this paperwork. ° °Please return immediately to the Emergency Department if you develop any new or worsening symptoms that concern you. ° °

## 2016-01-06 NOTE — ED Notes (Signed)
At baseline patient is unable to bear weight on legs. Wheelchair bound.

## 2016-01-06 NOTE — ED Provider Notes (Signed)
[facility] Emergency Department Provider Note  ____________________________________________   First MD Initiated Contact with Patient 01/06/16 1642     (approximate)  I have reviewed the triage vital signs and the nursing notes.   HISTORY  Chief Complaint Altered Mental Status    HPI Kyle Vasquez is a 82 y.o. male with a history that includes chronic atrial fibrillation on warfarin, sick sinus syndrome, symptomatic bradycardia with syncope and collapse, hypertension, physical deconditioning, and history of stroke and who is wheelchair bound who presents for evaluation of a syncopal episode today while at Dr. Bethanne Ginger office (cardiology) .  He was there for a routine visit when he reportedly became acutely altered and confused and was minimally responsive for several minutes.  Dr. Ubaldo Glassing saw him during this period and advisedthat he should go immediately to the emergency department for evaluation.  His son and wife are present and his son stated that after a couple minutes he came back to normal and is currently at his baseline.  He denies any recent illness and he specifically denies fever/chills, chest pain, shortness of breath, nausea, vomiting, abdominal pain, dysuria, diarrhea.  He denies any numbness or weakness in any of his extremities.  He has chronic weakness and his lower extremities and is wheelchair bound but he says he feels the same as always.  He has no facial droop and no difficulty swallowing.  Onset of the symptoms was acute and the presentation was severe but he is now back to baseline.  His son said that it looked to him like he was having another one of his episodes like has happened multiple times in the past.   Past Medical History:  Diagnosis Date  . Atrial fibrillation ([place])   . Hypertension   . Stroke [facility])     Patient Active Problem List   Diagnosis Date Noted  . Bradycardia 09/08/2015  . Symptomatic bradycardia 09/07/2015  .  Syncope and collapse 09/07/2015  . Atrial fibrillation ([place]) 09/07/2015  . HTN (hypertension) 09/07/2015  . Physical deconditioning 09/07/2015  . H/O: CVA (cerebrovascular accident) 09/07/2015    Past Surgical History:  Procedure Laterality Date  . BACK SURGERY    . CARDIAC SURGERY    . NECK SURGERY      Prior to Admission medications   Medication Sig Start Date End Date Taking? Authorizing Provider  albuterol (PROVENTIL HFA;VENTOLIN HFA) 108 (90 Base) MCG/ACT inhaler Inhale 2 puffs into the lungs every 6 (six) hours as needed for wheezing or shortness of breath.    Historical Provider, MD  cephALEXin (KEFLEX) 250 MG capsule Take 1 capsule (250 mg total) by mouth 3 (three) times daily. 09/09/15   Loletha Grayer, MD  Fluticasone-Salmeterol (ADVAIR) 250-50 MCG/DOSE AEPB Inhale 1 puff into the lungs 2 (two) times daily.    Historical Provider, MD  furosemide (LASIX) 20 MG tablet Take 20 mg by mouth daily.    Historical Provider, MD  losartan (COZAAR) 25 MG tablet Take 25 mg by mouth daily.    Historical Provider, MD  vitamin B-12 (CYANOCOBALAMIN) 1000 MCG tablet Take 1,000 mcg by mouth daily.    Historical Provider, MD  warfarin (COUMADIN) 3 MG tablet Take 3 mg by mouth every evening. Pt takes Monday-Friday.    Historical Provider, MD  warfarin (COUMADIN) 4 MG tablet Take 4 mg by mouth every evening. Pt takes Saturday and Sunday.    Historical Provider, MD    Allergies Review of patient's allergies indicates no known allergies.  No family history on file.  Social History Social History  Substance Use Topics  . Smoking status: Former Research scientist (life sciences)  . Smokeless tobacco: Never Used  . Alcohol use No    Review of Systems Constitutional: No fever/chills Eyes: No visual changes. ENT: No sore throat. Cardiovascular: Denies chest pain.  +syncope Respiratory: Denies shortness of breath. Gastrointestinal: No abdominal pain.  No nausea, no vomiting.  No diarrhea.  No  constipation. Genitourinary: Negative for dysuria. Musculoskeletal: Negative for back pain. Skin: Negative for rash. Neurological: Negative for headaches, focal weakness or numbness.  10-point ROS otherwise negative.  ____________________________________________   PHYSICAL EXAM:  VITAL SIGNS: ED Triage Vitals  Enc Vitals Group     BP 01/06/16 1625 114/62     Pulse Rate 01/06/16 1625 70     Resp 01/06/16 1625 18     Temp --      Temp src --      SpO2 01/06/16 1625 100 %     Weight 01/06/16 1625 187 lb (84.8 kg)     Height --      Head Circumference --      Peak Flow --      Pain Score 01/06/16 1626 0     Pain Loc --      Pain Edu? --      Excl. in [place]? --     Constitutional: Alert and oriented. Appears chronically deconditioned but is in no acute distress Eyes: Conjunctivae are normal. PERRL. EOMI. Head: Atraumatic. Nose: No congestion/rhinnorhea. Mouth/Throat: Mucous membranes are moist.  Oropharynx non-erythematous. Neck: No stridor.  No meningeal signs.   Cardiovascular: Normal rate, regular rhythm. Good peripheral circulation. Grossly normal heart sounds. Respiratory: Normal respiratory effort.  No retractions. Lungs CTAB. Gastrointestinal: Soft and nontender. No distention.  Musculoskeletal: No lower extremity tenderness nor edema. No gross deformities of extremities. Neurologic:  Normal speech and language. No gross focal neurologic deficits are appreciated.  He has a minimal ability to move his lower extremities.  He has good bilateral grip strength and strength in the major muscle groups of his upper extremities.  He has no gross  Cranial nerve deficits.   Skin:  Skin is warm, dry and intact. No rash noted. Psychiatric: Mood and affect are normal. Speech and behavior are normal.  ____________________________________________   LABS (all labs ordered are listed, but only abnormal results are displayed)  Labs Reviewed  COMPREHENSIVE METABOLIC PANEL - Abnormal;  Notable for the following:       Result Value   Glucose, Bld 103 (*)    BUN 44 (*)    Creatinine, Ser 2.90 (*)    Albumin 3.3 (*)    ALT 9 (*)    Total Bilirubin <0.1 (*)    GFR calc non Af Amer 19 (*)    GFR calc Af Amer 22 (*)    All other components within normal limits  CBC - Abnormal; Notable for the following:    WBC 3.0 (*)    RBC 3.47 (*)    Hemoglobin 10.6 (*)    HCT 31.9 (*)    All other components within normal limits  PROTIME-INR - Abnormal; Notable for the following:    Prothrombin Time 23.4 (*)    All other components within normal limits  URINALYSIS COMPLETEWITH MICROSCOPIC ([facility] ONLY) - Abnormal; Notable for the following:    Color, Urine YELLOW (*)    APPearance HAZY (*)    Hgb urine dipstick 1+ (*)    Protein, ur  100 (*)    Bacteria, UA RARE (*)    Squamous Epithelial / LPF 0-5 (*)    All other components within normal limits  GLUCOSE, CAPILLARY  MAGNESIUM   ____________________________________________  EKG  ED ECG REPORT I, Donni Oglesby, the attending physician, personally viewed and interpreted this ECG.  Date: 01/06/2016 EKG Time: 16:36 Rate: 57 Rhythm: Atrial fibrillation with slow ventricular response QRS Axis: normal Intervals: Show fibrillation with nonspecific intraventricular conduction delay ST/T Wave abnormalities: Non-specific ST segment / T-wave changes, but no evidence of acute ischemia. Conduction Disturbances: none Narrative Interpretation: No evidence of acute ischemia  ____________________________________________  RADIOLOGY   Ct Head Wo Contrast  Result Date: 01/06/2016 CLINICAL DATA:  Weakness and syncope.  Fatigue and lethargy. EXAM: CT HEAD WITHOUT CONTRAST TECHNIQUE: Contiguous axial images were obtained from the base of the skull through the vertex without intravenous contrast. COMPARISON:  12/05/2013. FINDINGS: Brain: Atrophy. Marked dilatation of the lateral ventricles, stable. Confluent low-attenuation in the  periventricular deep white matter. Subsequent malacia in the right occipital lobe. Vascular: No hyperdense vessel or unexpected calcification. Skull: Normal. Negative for fracture or focal lesion. Sinuses/Orbits: No acute finding. Other: None. IMPRESSION: 1. No acute intracranial abnormality. 2. Atrophy, chronic microvascular white matter ischemic changes and large right PCA territory infarct. Electronically Signed   By: Lorin Picket M.D.   On: 01/06/2016 17:27    ____________________________________________   PROCEDURES  Procedure(s) performed:   Procedures   Critical Care performed: No ____________________________________________   INITIAL IMPRESSION / ASSESSMENT AND PLAN / ED COURSE  Pertinent labs & imaging results that were available during my care of the patient were reviewed by me and considered in my medical decision making (see chart for details).  The patient is back at his baseline with no complaints at this time.  It seems most likely that he had another episode of syncope due to slow ventricular rate.  I reviewed the electronic note from Dr. Ubaldo Glassing and it seemed like he was concerned about the possibility of a CVA.  The patient is anticoagulated on warfarin but given the acuity of his symptoms and how quickly resolved I will check a head CT to look for any evidence of CVA while we are awaiting lab results.  The family is comfortable with this plan.   Clinical Course  Value Comment By Time  CT HEAD WO CONTRAST Unremarkable/chronic head CT Hinda Kehr, MD 10/11 1803  Creatinine: (!) 2.90 The patient's creatinine of 2.9 as chronic Hinda Kehr, MD 10/11 1804   I checked on the patient and he is back at his baseline according to his son and wife and the patient is currently sleeping comfortably.  I discussed the whole workup with the patient's son and he is comfortable with how normal he is acting and is ready to go home.  His urinalysis is pending and that I anticipate  discharge and outpatient follow-up.I gave my usual and customary return precautions.  Hinda Kehr, MD 10/11 [phone]   Urinalysis unremarkable.  Patient will f/u as outpatient. Hinda Kehr, MD 10/11 [phone]    ____________________________________________  FINAL CLINICAL IMPRESSION(S) / ED DIAGNOSES  Final diagnoses:  Syncope, cardiogenic     MEDICATIONS GIVEN DURING THIS VISIT:  Medications - No data to display   NEW OUTPATIENT MEDICATIONS STARTED DURING THIS VISIT:  New Prescriptions   No medications on file    Modified Medications   No medications on file    Discontinued Medications   No medications on file  Note:  This document was prepared using Dragon voice recognition software and may include unintentional dictation errors.    Hinda Kehr, MD 01/06/16 [phone]

## 2016-01-07 DIAGNOSIS — I10 Essential (primary) hypertension: Secondary | ICD-10-CM | POA: Diagnosis not present

## 2016-01-08 DIAGNOSIS — I10 Essential (primary) hypertension: Secondary | ICD-10-CM | POA: Diagnosis not present

## 2016-01-11 DIAGNOSIS — I10 Essential (primary) hypertension: Secondary | ICD-10-CM | POA: Diagnosis not present

## 2016-01-12 DIAGNOSIS — I4891 Unspecified atrial fibrillation: Secondary | ICD-10-CM | POA: Diagnosis not present

## 2016-01-12 DIAGNOSIS — J449 Chronic obstructive pulmonary disease, unspecified: Secondary | ICD-10-CM | POA: Diagnosis not present

## 2016-01-12 DIAGNOSIS — I509 Heart failure, unspecified: Secondary | ICD-10-CM | POA: Diagnosis not present

## 2016-01-12 DIAGNOSIS — I10 Essential (primary) hypertension: Secondary | ICD-10-CM | POA: Diagnosis not present

## 2016-01-13 DIAGNOSIS — I10 Essential (primary) hypertension: Secondary | ICD-10-CM | POA: Diagnosis not present

## 2016-01-14 DIAGNOSIS — I10 Essential (primary) hypertension: Secondary | ICD-10-CM | POA: Diagnosis not present

## 2016-01-15 DIAGNOSIS — I10 Essential (primary) hypertension: Secondary | ICD-10-CM | POA: Diagnosis not present

## 2016-01-18 DIAGNOSIS — I10 Essential (primary) hypertension: Secondary | ICD-10-CM | POA: Diagnosis not present

## 2016-01-19 DIAGNOSIS — I10 Essential (primary) hypertension: Secondary | ICD-10-CM | POA: Diagnosis not present

## 2016-01-20 DIAGNOSIS — I10 Essential (primary) hypertension: Secondary | ICD-10-CM | POA: Diagnosis not present

## 2016-01-21 DIAGNOSIS — E7800 Pure hypercholesterolemia, unspecified: Secondary | ICD-10-CM | POA: Diagnosis not present

## 2016-01-21 DIAGNOSIS — I10 Essential (primary) hypertension: Secondary | ICD-10-CM | POA: Diagnosis not present

## 2016-01-21 DIAGNOSIS — I482 Chronic atrial fibrillation: Secondary | ICD-10-CM | POA: Diagnosis not present

## 2016-01-22 DIAGNOSIS — I10 Essential (primary) hypertension: Secondary | ICD-10-CM | POA: Diagnosis not present

## 2016-01-25 DIAGNOSIS — I10 Essential (primary) hypertension: Secondary | ICD-10-CM | POA: Diagnosis not present

## 2016-01-26 DIAGNOSIS — I10 Essential (primary) hypertension: Secondary | ICD-10-CM | POA: Diagnosis not present

## 2016-01-27 DIAGNOSIS — I10 Essential (primary) hypertension: Secondary | ICD-10-CM | POA: Diagnosis not present

## 2016-01-28 DIAGNOSIS — I10 Essential (primary) hypertension: Secondary | ICD-10-CM | POA: Diagnosis not present

## 2016-01-29 DIAGNOSIS — I10 Essential (primary) hypertension: Secondary | ICD-10-CM | POA: Diagnosis not present

## 2016-01-31 DIAGNOSIS — R32 Unspecified urinary incontinence: Secondary | ICD-10-CM | POA: Diagnosis not present

## 2016-01-31 DIAGNOSIS — S020XXA Fracture of vault of skull, initial encounter for closed fracture: Secondary | ICD-10-CM | POA: Diagnosis not present

## 2016-02-01 DIAGNOSIS — I10 Essential (primary) hypertension: Secondary | ICD-10-CM | POA: Diagnosis not present

## 2016-02-02 DIAGNOSIS — I10 Essential (primary) hypertension: Secondary | ICD-10-CM | POA: Diagnosis not present

## 2016-02-03 DIAGNOSIS — I10 Essential (primary) hypertension: Secondary | ICD-10-CM | POA: Diagnosis not present

## 2016-02-04 DIAGNOSIS — I10 Essential (primary) hypertension: Secondary | ICD-10-CM | POA: Diagnosis not present

## 2016-02-05 DIAGNOSIS — I10 Essential (primary) hypertension: Secondary | ICD-10-CM | POA: Diagnosis not present

## 2016-02-08 DIAGNOSIS — I10 Essential (primary) hypertension: Secondary | ICD-10-CM | POA: Diagnosis not present

## 2016-02-09 DIAGNOSIS — I10 Essential (primary) hypertension: Secondary | ICD-10-CM | POA: Diagnosis not present

## 2016-02-10 DIAGNOSIS — I10 Essential (primary) hypertension: Secondary | ICD-10-CM | POA: Diagnosis not present

## 2016-02-11 DIAGNOSIS — I10 Essential (primary) hypertension: Secondary | ICD-10-CM | POA: Diagnosis not present

## 2016-02-12 DIAGNOSIS — I509 Heart failure, unspecified: Secondary | ICD-10-CM | POA: Diagnosis not present

## 2016-02-12 DIAGNOSIS — I10 Essential (primary) hypertension: Secondary | ICD-10-CM | POA: Diagnosis not present

## 2016-02-12 DIAGNOSIS — J449 Chronic obstructive pulmonary disease, unspecified: Secondary | ICD-10-CM | POA: Diagnosis not present

## 2016-02-12 DIAGNOSIS — I4891 Unspecified atrial fibrillation: Secondary | ICD-10-CM | POA: Diagnosis not present

## 2016-02-15 DIAGNOSIS — I10 Essential (primary) hypertension: Secondary | ICD-10-CM | POA: Diagnosis not present

## 2016-02-16 DIAGNOSIS — I10 Essential (primary) hypertension: Secondary | ICD-10-CM | POA: Diagnosis not present

## 2016-02-17 DIAGNOSIS — I10 Essential (primary) hypertension: Secondary | ICD-10-CM | POA: Diagnosis not present

## 2016-02-18 DIAGNOSIS — I10 Essential (primary) hypertension: Secondary | ICD-10-CM | POA: Diagnosis not present

## 2016-02-19 DIAGNOSIS — I10 Essential (primary) hypertension: Secondary | ICD-10-CM | POA: Diagnosis not present

## 2016-02-22 DIAGNOSIS — I10 Essential (primary) hypertension: Secondary | ICD-10-CM | POA: Diagnosis not present

## 2016-02-23 DIAGNOSIS — I10 Essential (primary) hypertension: Secondary | ICD-10-CM | POA: Diagnosis not present

## 2016-02-24 DIAGNOSIS — I10 Essential (primary) hypertension: Secondary | ICD-10-CM | POA: Diagnosis not present

## 2016-02-25 DIAGNOSIS — I10 Essential (primary) hypertension: Secondary | ICD-10-CM | POA: Diagnosis not present

## 2016-02-26 DIAGNOSIS — I10 Essential (primary) hypertension: Secondary | ICD-10-CM | POA: Diagnosis not present

## 2016-02-26 DIAGNOSIS — R32 Unspecified urinary incontinence: Secondary | ICD-10-CM | POA: Diagnosis not present

## 2016-02-26 DIAGNOSIS — S020XXA Fracture of vault of skull, initial encounter for closed fracture: Secondary | ICD-10-CM | POA: Diagnosis not present

## 2016-02-29 DIAGNOSIS — I10 Essential (primary) hypertension: Secondary | ICD-10-CM | POA: Diagnosis not present

## 2016-03-01 DIAGNOSIS — I10 Essential (primary) hypertension: Secondary | ICD-10-CM | POA: Diagnosis not present

## 2016-03-02 DIAGNOSIS — I10 Essential (primary) hypertension: Secondary | ICD-10-CM | POA: Diagnosis not present

## 2016-03-03 DIAGNOSIS — I10 Essential (primary) hypertension: Secondary | ICD-10-CM | POA: Diagnosis not present

## 2016-03-04 DIAGNOSIS — I10 Essential (primary) hypertension: Secondary | ICD-10-CM | POA: Diagnosis not present

## 2016-03-07 DIAGNOSIS — I10 Essential (primary) hypertension: Secondary | ICD-10-CM | POA: Diagnosis not present

## 2016-03-08 DIAGNOSIS — I10 Essential (primary) hypertension: Secondary | ICD-10-CM | POA: Diagnosis not present

## 2016-03-09 DIAGNOSIS — I10 Essential (primary) hypertension: Secondary | ICD-10-CM | POA: Diagnosis not present

## 2016-03-10 DIAGNOSIS — I10 Essential (primary) hypertension: Secondary | ICD-10-CM | POA: Diagnosis not present

## 2016-03-11 DIAGNOSIS — I10 Essential (primary) hypertension: Secondary | ICD-10-CM | POA: Diagnosis not present

## 2016-03-13 DIAGNOSIS — J449 Chronic obstructive pulmonary disease, unspecified: Secondary | ICD-10-CM | POA: Diagnosis not present

## 2016-03-13 DIAGNOSIS — I10 Essential (primary) hypertension: Secondary | ICD-10-CM | POA: Diagnosis not present

## 2016-03-13 DIAGNOSIS — I4891 Unspecified atrial fibrillation: Secondary | ICD-10-CM | POA: Diagnosis not present

## 2016-03-13 DIAGNOSIS — I509 Heart failure, unspecified: Secondary | ICD-10-CM | POA: Diagnosis not present

## 2016-03-14 DIAGNOSIS — I10 Essential (primary) hypertension: Secondary | ICD-10-CM | POA: Diagnosis not present

## 2016-03-15 DIAGNOSIS — I10 Essential (primary) hypertension: Secondary | ICD-10-CM | POA: Diagnosis not present

## 2016-03-16 DIAGNOSIS — I10 Essential (primary) hypertension: Secondary | ICD-10-CM | POA: Diagnosis not present

## 2016-03-17 DIAGNOSIS — I10 Essential (primary) hypertension: Secondary | ICD-10-CM | POA: Diagnosis not present

## 2016-03-18 DIAGNOSIS — I10 Essential (primary) hypertension: Secondary | ICD-10-CM | POA: Diagnosis not present

## 2016-03-21 DIAGNOSIS — I10 Essential (primary) hypertension: Secondary | ICD-10-CM | POA: Diagnosis not present

## 2016-03-22 DIAGNOSIS — I10 Essential (primary) hypertension: Secondary | ICD-10-CM | POA: Diagnosis not present

## 2016-03-23 DIAGNOSIS — I10 Essential (primary) hypertension: Secondary | ICD-10-CM | POA: Diagnosis not present

## 2016-03-24 DIAGNOSIS — I10 Essential (primary) hypertension: Secondary | ICD-10-CM | POA: Diagnosis not present

## 2016-03-25 DIAGNOSIS — I10 Essential (primary) hypertension: Secondary | ICD-10-CM | POA: Diagnosis not present

## 2016-03-28 DIAGNOSIS — I10 Essential (primary) hypertension: Secondary | ICD-10-CM | POA: Diagnosis not present

## 2016-03-29 DIAGNOSIS — S020XXA Fracture of vault of skull, initial encounter for closed fracture: Secondary | ICD-10-CM | POA: Diagnosis not present

## 2016-03-29 DIAGNOSIS — I10 Essential (primary) hypertension: Secondary | ICD-10-CM | POA: Diagnosis not present

## 2016-03-29 DIAGNOSIS — R32 Unspecified urinary incontinence: Secondary | ICD-10-CM | POA: Diagnosis not present

## 2016-03-30 DIAGNOSIS — I10 Essential (primary) hypertension: Secondary | ICD-10-CM | POA: Diagnosis not present

## 2016-03-31 DIAGNOSIS — I10 Essential (primary) hypertension: Secondary | ICD-10-CM | POA: Diagnosis not present

## 2016-04-01 DIAGNOSIS — I10 Essential (primary) hypertension: Secondary | ICD-10-CM | POA: Diagnosis not present

## 2016-04-04 DIAGNOSIS — I10 Essential (primary) hypertension: Secondary | ICD-10-CM | POA: Diagnosis not present

## 2016-04-05 DIAGNOSIS — I10 Essential (primary) hypertension: Secondary | ICD-10-CM | POA: Diagnosis not present

## 2016-04-06 DIAGNOSIS — I10 Essential (primary) hypertension: Secondary | ICD-10-CM | POA: Diagnosis not present

## 2016-04-07 DIAGNOSIS — I10 Essential (primary) hypertension: Secondary | ICD-10-CM | POA: Diagnosis not present

## 2016-04-08 DIAGNOSIS — I10 Essential (primary) hypertension: Secondary | ICD-10-CM | POA: Diagnosis not present

## 2016-04-11 DIAGNOSIS — I10 Essential (primary) hypertension: Secondary | ICD-10-CM | POA: Diagnosis not present

## 2016-04-12 DIAGNOSIS — I10 Essential (primary) hypertension: Secondary | ICD-10-CM | POA: Diagnosis not present

## 2016-04-13 DIAGNOSIS — I4891 Unspecified atrial fibrillation: Secondary | ICD-10-CM | POA: Diagnosis not present

## 2016-04-13 DIAGNOSIS — I10 Essential (primary) hypertension: Secondary | ICD-10-CM | POA: Diagnosis not present

## 2016-04-13 DIAGNOSIS — I509 Heart failure, unspecified: Secondary | ICD-10-CM | POA: Diagnosis not present

## 2016-04-13 DIAGNOSIS — J449 Chronic obstructive pulmonary disease, unspecified: Secondary | ICD-10-CM | POA: Diagnosis not present

## 2016-04-14 DIAGNOSIS — I10 Essential (primary) hypertension: Secondary | ICD-10-CM | POA: Diagnosis not present

## 2016-04-15 DIAGNOSIS — I10 Essential (primary) hypertension: Secondary | ICD-10-CM | POA: Diagnosis not present

## 2016-04-18 DIAGNOSIS — I10 Essential (primary) hypertension: Secondary | ICD-10-CM | POA: Diagnosis not present

## 2016-04-19 DIAGNOSIS — I10 Essential (primary) hypertension: Secondary | ICD-10-CM | POA: Diagnosis not present

## 2016-04-20 DIAGNOSIS — I10 Essential (primary) hypertension: Secondary | ICD-10-CM | POA: Diagnosis not present

## 2016-04-21 DIAGNOSIS — I10 Essential (primary) hypertension: Secondary | ICD-10-CM | POA: Diagnosis not present

## 2016-04-22 DIAGNOSIS — I10 Essential (primary) hypertension: Secondary | ICD-10-CM | POA: Diagnosis not present

## 2016-04-25 DIAGNOSIS — I10 Essential (primary) hypertension: Secondary | ICD-10-CM | POA: Diagnosis not present

## 2016-04-26 DIAGNOSIS — I10 Essential (primary) hypertension: Secondary | ICD-10-CM | POA: Diagnosis not present

## 2016-04-28 DIAGNOSIS — S020XXA Fracture of vault of skull, initial encounter for closed fracture: Secondary | ICD-10-CM | POA: Diagnosis not present

## 2016-04-28 DIAGNOSIS — R32 Unspecified urinary incontinence: Secondary | ICD-10-CM | POA: Diagnosis not present

## 2016-04-28 DIAGNOSIS — I10 Essential (primary) hypertension: Secondary | ICD-10-CM | POA: Diagnosis not present

## 2016-04-29 DIAGNOSIS — I10 Essential (primary) hypertension: Secondary | ICD-10-CM | POA: Diagnosis not present

## 2016-05-02 DIAGNOSIS — I10 Essential (primary) hypertension: Secondary | ICD-10-CM | POA: Diagnosis not present

## 2016-05-03 DIAGNOSIS — I10 Essential (primary) hypertension: Secondary | ICD-10-CM | POA: Diagnosis not present

## 2016-05-04 DIAGNOSIS — I10 Essential (primary) hypertension: Secondary | ICD-10-CM | POA: Diagnosis not present

## 2016-05-05 DIAGNOSIS — Z7901 Long term (current) use of anticoagulants: Secondary | ICD-10-CM | POA: Diagnosis not present

## 2016-05-05 DIAGNOSIS — Z23 Encounter for immunization: Secondary | ICD-10-CM | POA: Diagnosis not present

## 2016-05-05 DIAGNOSIS — E7800 Pure hypercholesterolemia, unspecified: Secondary | ICD-10-CM | POA: Diagnosis not present

## 2016-05-05 DIAGNOSIS — G603 Idiopathic progressive neuropathy: Secondary | ICD-10-CM | POA: Diagnosis not present

## 2016-05-05 DIAGNOSIS — I482 Chronic atrial fibrillation: Secondary | ICD-10-CM | POA: Diagnosis not present

## 2016-05-05 DIAGNOSIS — Z0000 Encounter for general adult medical examination without abnormal findings: Secondary | ICD-10-CM | POA: Diagnosis not present

## 2016-05-05 DIAGNOSIS — Z125 Encounter for screening for malignant neoplasm of prostate: Secondary | ICD-10-CM | POA: Diagnosis not present

## 2016-05-05 DIAGNOSIS — N183 Chronic kidney disease, stage 3 (moderate): Secondary | ICD-10-CM | POA: Diagnosis not present

## 2016-05-05 DIAGNOSIS — J42 Unspecified chronic bronchitis: Secondary | ICD-10-CM | POA: Diagnosis not present

## 2016-05-05 DIAGNOSIS — I10 Essential (primary) hypertension: Secondary | ICD-10-CM | POA: Diagnosis not present

## 2016-05-06 DIAGNOSIS — I10 Essential (primary) hypertension: Secondary | ICD-10-CM | POA: Diagnosis not present

## 2016-05-09 DIAGNOSIS — I10 Essential (primary) hypertension: Secondary | ICD-10-CM | POA: Diagnosis not present

## 2016-05-10 DIAGNOSIS — I10 Essential (primary) hypertension: Secondary | ICD-10-CM | POA: Diagnosis not present

## 2016-05-11 DIAGNOSIS — I10 Essential (primary) hypertension: Secondary | ICD-10-CM | POA: Diagnosis not present

## 2016-05-12 DIAGNOSIS — I10 Essential (primary) hypertension: Secondary | ICD-10-CM | POA: Diagnosis not present

## 2016-05-13 DIAGNOSIS — I10 Essential (primary) hypertension: Secondary | ICD-10-CM | POA: Diagnosis not present

## 2016-05-14 DIAGNOSIS — J449 Chronic obstructive pulmonary disease, unspecified: Secondary | ICD-10-CM | POA: Diagnosis not present

## 2016-05-14 DIAGNOSIS — I509 Heart failure, unspecified: Secondary | ICD-10-CM | POA: Diagnosis not present

## 2016-05-14 DIAGNOSIS — I4891 Unspecified atrial fibrillation: Secondary | ICD-10-CM | POA: Diagnosis not present

## 2016-05-14 DIAGNOSIS — I10 Essential (primary) hypertension: Secondary | ICD-10-CM | POA: Diagnosis not present

## 2016-05-16 DIAGNOSIS — I10 Essential (primary) hypertension: Secondary | ICD-10-CM | POA: Diagnosis not present

## 2016-05-17 DIAGNOSIS — I10 Essential (primary) hypertension: Secondary | ICD-10-CM | POA: Diagnosis not present

## 2016-05-18 DIAGNOSIS — I10 Essential (primary) hypertension: Secondary | ICD-10-CM | POA: Diagnosis not present

## 2016-05-19 DIAGNOSIS — I10 Essential (primary) hypertension: Secondary | ICD-10-CM | POA: Diagnosis not present

## 2016-05-20 DIAGNOSIS — I10 Essential (primary) hypertension: Secondary | ICD-10-CM | POA: Diagnosis not present

## 2016-05-23 DIAGNOSIS — I10 Essential (primary) hypertension: Secondary | ICD-10-CM | POA: Diagnosis not present

## 2016-05-24 DIAGNOSIS — I10 Essential (primary) hypertension: Secondary | ICD-10-CM | POA: Diagnosis not present

## 2016-05-25 DIAGNOSIS — I10 Essential (primary) hypertension: Secondary | ICD-10-CM | POA: Diagnosis not present

## 2016-05-26 DIAGNOSIS — I10 Essential (primary) hypertension: Secondary | ICD-10-CM | POA: Diagnosis not present

## 2016-05-26 DIAGNOSIS — S020XXA Fracture of vault of skull, initial encounter for closed fracture: Secondary | ICD-10-CM | POA: Diagnosis not present

## 2016-05-26 DIAGNOSIS — R32 Unspecified urinary incontinence: Secondary | ICD-10-CM | POA: Diagnosis not present

## 2016-05-27 DIAGNOSIS — I10 Essential (primary) hypertension: Secondary | ICD-10-CM | POA: Diagnosis not present

## 2016-05-30 DIAGNOSIS — I10 Essential (primary) hypertension: Secondary | ICD-10-CM | POA: Diagnosis not present

## 2016-05-31 DIAGNOSIS — I10 Essential (primary) hypertension: Secondary | ICD-10-CM | POA: Diagnosis not present

## 2016-06-01 DIAGNOSIS — I10 Essential (primary) hypertension: Secondary | ICD-10-CM | POA: Diagnosis not present

## 2016-06-02 DIAGNOSIS — I10 Essential (primary) hypertension: Secondary | ICD-10-CM | POA: Diagnosis not present

## 2016-06-03 DIAGNOSIS — I10 Essential (primary) hypertension: Secondary | ICD-10-CM | POA: Diagnosis not present

## 2016-06-06 DIAGNOSIS — I10 Essential (primary) hypertension: Secondary | ICD-10-CM | POA: Diagnosis not present

## 2016-06-07 DIAGNOSIS — I10 Essential (primary) hypertension: Secondary | ICD-10-CM | POA: Diagnosis not present

## 2016-06-08 DIAGNOSIS — I10 Essential (primary) hypertension: Secondary | ICD-10-CM | POA: Diagnosis not present

## 2016-06-09 DIAGNOSIS — I10 Essential (primary) hypertension: Secondary | ICD-10-CM | POA: Diagnosis not present

## 2016-06-10 ENCOUNTER — Encounter (INDEPENDENT_AMBULATORY_CARE_PROVIDER_SITE_OTHER): Payer: Self-pay

## 2016-06-10 ENCOUNTER — Ambulatory Visit (INDEPENDENT_AMBULATORY_CARE_PROVIDER_SITE_OTHER): Payer: Self-pay | Admitting: Vascular Surgery

## 2016-06-11 DIAGNOSIS — I509 Heart failure, unspecified: Secondary | ICD-10-CM | POA: Diagnosis not present

## 2016-06-11 DIAGNOSIS — J449 Chronic obstructive pulmonary disease, unspecified: Secondary | ICD-10-CM | POA: Diagnosis not present

## 2016-06-11 DIAGNOSIS — I10 Essential (primary) hypertension: Secondary | ICD-10-CM | POA: Diagnosis not present

## 2016-06-11 DIAGNOSIS — I4891 Unspecified atrial fibrillation: Secondary | ICD-10-CM | POA: Diagnosis not present

## 2016-06-14 DIAGNOSIS — I10 Essential (primary) hypertension: Secondary | ICD-10-CM | POA: Diagnosis not present

## 2016-06-16 DIAGNOSIS — I10 Essential (primary) hypertension: Secondary | ICD-10-CM | POA: Diagnosis not present

## 2016-06-17 DIAGNOSIS — I10 Essential (primary) hypertension: Secondary | ICD-10-CM | POA: Diagnosis not present

## 2016-06-20 DIAGNOSIS — I10 Essential (primary) hypertension: Secondary | ICD-10-CM | POA: Diagnosis not present

## 2016-06-21 DIAGNOSIS — I10 Essential (primary) hypertension: Secondary | ICD-10-CM | POA: Diagnosis not present

## 2016-06-22 DIAGNOSIS — I10 Essential (primary) hypertension: Secondary | ICD-10-CM | POA: Diagnosis not present

## 2016-06-23 ENCOUNTER — Emergency Department
Admission: EM | Admit: 2016-06-23 | Discharge: 2016-06-23 | Disposition: A | Discharge status: Home / Self Care | Payer: Medicare HMO | Attending: Emergency Medicine | Admitting: Emergency Medicine

## 2016-06-23 ENCOUNTER — Encounter: Payer: Self-pay | Admitting: Emergency Medicine

## 2016-06-23 DIAGNOSIS — M6281 Muscle weakness (generalized): Secondary | ICD-10-CM | POA: Diagnosis not present

## 2016-06-23 DIAGNOSIS — Z87891 Personal history of nicotine dependence: Secondary | ICD-10-CM | POA: Diagnosis not present

## 2016-06-23 DIAGNOSIS — Z8673 Personal history of transient ischemic attack (TIA), and cerebral infarction without residual deficits: Secondary | ICD-10-CM | POA: Diagnosis not present

## 2016-06-23 DIAGNOSIS — I10 Essential (primary) hypertension: Secondary | ICD-10-CM | POA: Insufficient documentation

## 2016-06-23 DIAGNOSIS — N390 Urinary tract infection, site not specified: Secondary | ICD-10-CM | POA: Insufficient documentation

## 2016-06-23 DIAGNOSIS — R42 Dizziness and giddiness: Secondary | ICD-10-CM | POA: Diagnosis not present

## 2016-06-23 LAB — CBC WITH DIFFERENTIAL/PLATELET
BASOS PCT: 1 %
Basophils Absolute: 0 10*3/uL (ref 0–0.1)
Eosinophils Absolute: 0.2 10*3/uL (ref 0–0.7)
Eosinophils Relative: 5 %
HEMATOCRIT: 26.1 % — AB (ref 40.0–52.0)
HEMOGLOBIN: 8.8 g/dL — AB (ref 13.0–18.0)
Lymphocytes Relative: 21 %
Lymphs Abs: 0.8 10*3/uL — ABNORMAL LOW (ref 1.0–3.6)
MCH: 30.9 pg (ref 26.0–34.0)
MCHC: 33.8 g/dL (ref 32.0–36.0)
MCV: 91.2 fL (ref 80.0–100.0)
MONOS PCT: 12 %
Monocytes Absolute: 0.5 10*3/uL (ref 0.2–1.0)
NEUTROS ABS: 2.4 10*3/uL (ref 1.4–6.5)
NEUTROS PCT: 61 %
Platelets: 166 10*3/uL (ref 150–440)
RBC: 2.86 MIL/uL — AB (ref 4.40–5.90)
RDW: 15.9 % — ABNORMAL HIGH (ref 11.5–14.5)
WBC: 3.9 10*3/uL (ref 3.8–10.6)

## 2016-06-23 LAB — URINALYSIS, COMPLETE (UACMP) WITH MICROSCOPIC
Bilirubin Urine: NEGATIVE
GLUCOSE, UA: NEGATIVE mg/dL
KETONES UR: NEGATIVE mg/dL
Nitrite: NEGATIVE
Protein, ur: 100 mg/dL — AB
SQUAMOUS EPITHELIAL / LPF: NONE SEEN
Specific Gravity, Urine: 1.01 (ref 1.005–1.030)
pH: 5 (ref 5.0–8.0)

## 2016-06-23 LAB — COMPREHENSIVE METABOLIC PANEL
ALBUMIN: 3 g/dL — AB (ref 3.5–5.0)
ALK PHOS: 53 U/L (ref 38–126)
ALT: 10 U/L — AB (ref 17–63)
ANION GAP: 7 (ref 5–15)
AST: 14 U/L — ABNORMAL LOW (ref 15–41)
BILIRUBIN TOTAL: 0.5 mg/dL (ref 0.3–1.2)
BUN: 59 mg/dL — AB (ref 6–20)
CALCIUM: 8.9 mg/dL (ref 8.9–10.3)
CO2: 23 mmol/L (ref 22–32)
CREATININE: 3.49 mg/dL — AB (ref 0.61–1.24)
Chloride: 110 mmol/L (ref 101–111)
GFR calc Af Amer: 17 mL/min — ABNORMAL LOW (ref >60)
GFR calc non Af Amer: 15 mL/min — ABNORMAL LOW (ref >60)
GLUCOSE: 79 mg/dL (ref 65–99)
Potassium: 4.9 mmol/L (ref 3.5–5.1)
SODIUM: 140 mmol/L (ref 135–145)
TOTAL PROTEIN: 6.9 g/dL (ref 6.5–8.1)

## 2016-06-23 LAB — TROPONIN I: Troponin I: 0.04 ng/mL (ref <0.03)

## 2016-06-23 LAB — PROTIME-INR
INR: 3.41
Prothrombin Time: 35.2 seconds — ABNORMAL HIGH (ref 11.4–15.2)

## 2016-06-23 MED ORDER — CEPHALEXIN 500 MG PO CAPS: 500.0000 mg | ORAL_CAPSULE | Freq: Three times a day (TID) | ORAL | 0 refills | Status: DC

## 2016-06-23 MED ORDER — SODIUM CHLORIDE 0.9 % IV BOLUS (SEPSIS)
500.0000 mL | Freq: Once | INTRAVENOUS | Status: AC
  Administered 2016-06-23: 500 mL via INTRAVENOUS

## 2016-06-23 NOTE — ED Provider Notes (Signed)
Time Seen: Approximately1411  I have reviewed the triage notes  Chief Complaint: Dizziness   History of Present Illness: Kyle Vasquez is a 83 y.o. male *who states a 2 day history of some nausea and dizziness. He describes his dizziness as feeling lightheaded. He denies any vertiginous symptoms. He denies any nausea or lightheadedness at this time. He does have a history of symptomatic bradycardia and syncope. The pain at this point. He denies any headache, chest pain, abdominal pain. Denies any dark tarry stool. He denies any loss of consciousness or focal weakness in either upper or lower extremities.   Past Medical History:  Diagnosis Date  . Atrial fibrillation ([place])   . Hypertension   . Stroke [facility])     Patient Active Problem List   Diagnosis Date Noted  . Bradycardia 09/08/2015  . Symptomatic bradycardia 09/07/2015  . Syncope and collapse 09/07/2015  . Atrial fibrillation ([place]) 09/07/2015  . HTN (hypertension) 09/07/2015  . Physical deconditioning 09/07/2015  . H/O: CVA (cerebrovascular accident) 09/07/2015    Past Surgical History:  Procedure Laterality Date  . BACK SURGERY    . CARDIAC SURGERY    . NECK SURGERY      Past Surgical History:  Procedure Laterality Date  . BACK SURGERY    . CARDIAC SURGERY    . NECK SURGERY      Current Outpatient Rx  . Order #: [id number] Class: Historical Med  . Order #: [id number] Class: Print  . Order #: [id number] Class: Historical Med  . Order #: [id number] Class: Historical Med  . Order #: [id number] Class: Historical Med  . Order #: [id number] Class: Historical Med  . Order #: [id number] Class: Historical Med  . Order #: [id number] Class: Historical Med    Allergies:  Patient has no known allergies.  Family History: No family history on file.  Social History: Social History  Substance Use Topics  . Smoking status: Former Research scientist (life sciences)  . Smokeless tobacco: Never Used  . Alcohol use No     Review of Systems:   10 point  review of systems was performed and was otherwise negative:  Constitutional: No fever Eyes: No visual disturbances ENT: No sore throat, ear pain Cardiac: No chest pain Respiratory: No shortness of breath, wheezing, or stridor Abdomen: No abdominal pain, no vomiting, No diarrhea Endocrine: No weight loss, No night sweats Extremities: No peripheral edema, cyanosis Skin: No rashes, easy bruising Neurologic: No focal weakness, trouble with speech or swollowing Urologic: No dysuria, Hematuria, or urinary frequency   Physical Exam:  ED Triage Vitals  Enc Vitals Group     BP 06/23/16 1408 (!) 150/78     Pulse Rate 06/23/16 1408 64     Resp 06/23/16 1408 16     Temp 06/23/16 1408 97.7 F (36.5 C)     Temp Source 06/23/16 1408 Oral     SpO2 06/23/16 1408 98 %     Weight 06/23/16 1409 190 lb (86.2 kg)     Height 06/23/16 1409 6\' 5"  (1.956 m)     Head Circumference --      Peak Flow --      Pain Score --      Pain Loc --      Pain Edu? --      Excl. in [place]? --     General: Awake , Alert , and Oriented times 3; GCS 15 Follows As all commands Head: Normal cephalic , atraumatic Eyes: Pupils equal , round, reactive to light Nose/Throat: No nasal drainage, patent upper  airway without erythema or exudate.  Neck: Supple, Full range of motion, No anterior adenopathy or palpable thyroid masses Lungs: Clear to ascultation without wheezes , rhonchi, or rales Heart: Regular rate, regular rhythm without murmurs , gallops , or rubs Abdomen: Soft, non tender without rebound, guarding , or rigidity; bowel sounds positive and symmetric in all 4 quadrants. No organomegaly .        Extremities: 2 plus symmetric pulses. No edema, clubbing or cyanosis Neurologic: n, Motor symmetric without deficits, sensory intact Skin: warm, dry, no rashes Rectal exam with chaperone present was guaiac negative  Labs:   All laboratory work was reviewed including any pertinent negatives or positives listed below:   Labs Reviewed  CBC WITH DIFFERENTIAL/PLATELET  COMPREHENSIVE METABOLIC PANEL  TROPONIN I  URINALYSIS, COMPLETE (UACMP) WITH MICROSCOPIC  Patient has findings consistent with a urinary tract infection with urine culture pending Hemoglobin is dropped slightly from previous down to 8.8 Patient has chronic renal failure and creatinine is slightly elevated up to 3.49 and BUN is 59   EKG:  ED ECG REPORT I, Daymon Larsen, the attending physician, personally viewed and interpreted this ECG.  Date: 06/23/2016 EKG Time: *1444Rate: *77Rhythm: Atrial fibrillation with occasional PACs  QRS Axis: normal Intervals: normal ST/T Wave abnormalities: normal Conduction Disturbances: none Narrative Interpretation: unremarkable No ischemic changes Poor quality EKG   ED Course: * Patient's stay here was uneventful and the atrial fibrillation is not a new finding. He was found to have a urinary tract infection and was slightly anemic with an exacerbation of his renal failure. He is guaiac negative with no evidence of a acute gastrointestinal bleedmost likely is either iron deficiency or vitamin deficiency anemia. Use given a boost of IV fluids and started on oral antibiotic therapy. Urine culture is pending. Since lightheadedness has improved with fluids and this may just be simply dehydration with urinary tract infection. I felt was unlikely the patient was septic as he is afebrile with a normal white count etc.     Assessment:  Acute exacerbation of chronic renal insufficiency with dehydration Atrial fibrillation (chronic)      Plan: Outpatient " Discharge Medication List as of 06/23/2016  5:11 PM    " Keflex Patient was advised to return immediately if condition worsens. Patient was advised to follow up with their primary care physician or other specialized physicians involved in their outpatient care. The patient and/or family member/power of attorney had laboratory results reviewed at  the bedside. All questions and concerns were addressed and appropriate discharge instructions were distributed by the nursing staff.             Daymon Larsen, MD 06/23/16 2046

## 2016-06-23 NOTE — Discharge Instructions (Signed)
Please contact her primary physician for further outpatient follow-up. Please encourage fluids. Return here for bloody stool, chest pain, feelings of lightheadedness persist or any other new concerns. Elevated mild urinary tract infection and a urine culture was established and we will start antibiotics for home.  Please return immediately if condition worsens. Please contact her primary physician or the physician you were given for referral. If you have any specialist physicians involved in her treatment and plan please also contact them. Thank you for using [facility].

## 2016-06-23 NOTE — ED Triage Notes (Addendum)
Patient presents to ED via [facility] from home with c/o dizziness and nausea x 2 days.

## 2016-06-23 NOTE — ED Notes (Signed)
Pt cleaned from BM and new diaper placed on pt before discharge

## 2016-06-24 DIAGNOSIS — I10 Essential (primary) hypertension: Secondary | ICD-10-CM | POA: Diagnosis not present

## 2016-06-26 LAB — URINE CULTURE: Culture: >=100000 — AB

## 2016-06-27 DIAGNOSIS — S020XXA Fracture of vault of skull, initial encounter for closed fracture: Secondary | ICD-10-CM | POA: Diagnosis not present

## 2016-06-27 DIAGNOSIS — R32 Unspecified urinary incontinence: Secondary | ICD-10-CM | POA: Diagnosis not present

## 2016-06-27 DIAGNOSIS — I10 Essential (primary) hypertension: Secondary | ICD-10-CM | POA: Diagnosis not present

## 2016-06-28 DIAGNOSIS — I10 Essential (primary) hypertension: Secondary | ICD-10-CM | POA: Diagnosis not present

## 2016-06-29 ENCOUNTER — Inpatient Hospital Stay
Admission: EM | Admit: 2016-06-29 | Discharge: 2016-07-01 | DRG: 309 | Disposition: A | Discharge status: Home Health | Payer: Medicare HMO | Attending: Internal Medicine | Admitting: Internal Medicine

## 2016-06-29 ENCOUNTER — Inpatient Hospital Stay: Payer: Medicare HMO

## 2016-06-29 ENCOUNTER — Emergency Department: Payer: Medicare HMO

## 2016-06-29 ENCOUNTER — Encounter: Payer: Self-pay | Admitting: Emergency Medicine

## 2016-06-29 ENCOUNTER — Inpatient Hospital Stay
Admit: 2016-06-29 | Discharge: 2016-06-29 | Disposition: A | Discharge status: Home / Self Care | Payer: Medicare HMO | Attending: Internal Medicine | Admitting: Internal Medicine

## 2016-06-29 DIAGNOSIS — N3000 Acute cystitis without hematuria: Secondary | ICD-10-CM | POA: Diagnosis not present

## 2016-06-29 DIAGNOSIS — I6523 Occlusion and stenosis of bilateral carotid arteries: Secondary | ICD-10-CM | POA: Diagnosis not present

## 2016-06-29 DIAGNOSIS — R001 Bradycardia, unspecified: Secondary | ICD-10-CM | POA: Diagnosis not present

## 2016-06-29 DIAGNOSIS — N179 Acute kidney failure, unspecified: Secondary | ICD-10-CM | POA: Diagnosis present

## 2016-06-29 DIAGNOSIS — D469 Myelodysplastic syndrome, unspecified: Secondary | ICD-10-CM | POA: Diagnosis present

## 2016-06-29 DIAGNOSIS — I739 Peripheral vascular disease, unspecified: Secondary | ICD-10-CM | POA: Diagnosis present

## 2016-06-29 DIAGNOSIS — R791 Abnormal coagulation profile: Secondary | ICD-10-CM | POA: Diagnosis present

## 2016-06-29 DIAGNOSIS — Z7401 Bed confinement status: Secondary | ICD-10-CM

## 2016-06-29 DIAGNOSIS — I495 Sick sinus syndrome: Secondary | ICD-10-CM | POA: Diagnosis not present

## 2016-06-29 DIAGNOSIS — J449 Chronic obstructive pulmonary disease, unspecified: Secondary | ICD-10-CM | POA: Diagnosis present

## 2016-06-29 DIAGNOSIS — E039 Hypothyroidism, unspecified: Secondary | ICD-10-CM | POA: Diagnosis present

## 2016-06-29 DIAGNOSIS — G9589 Other specified diseases of spinal cord: Secondary | ICD-10-CM | POA: Diagnosis not present

## 2016-06-29 DIAGNOSIS — N183 Chronic kidney disease, stage 3 (moderate): Secondary | ICD-10-CM | POA: Diagnosis not present

## 2016-06-29 DIAGNOSIS — N184 Chronic kidney disease, stage 4 (severe): Secondary | ICD-10-CM | POA: Diagnosis present

## 2016-06-29 DIAGNOSIS — Z87891 Personal history of nicotine dependence: Secondary | ICD-10-CM

## 2016-06-29 DIAGNOSIS — R7989 Other specified abnormal findings of blood chemistry: Secondary | ICD-10-CM | POA: Diagnosis not present

## 2016-06-29 DIAGNOSIS — I482 Chronic atrial fibrillation: Secondary | ICD-10-CM | POA: Diagnosis present

## 2016-06-29 DIAGNOSIS — J9811 Atelectasis: Secondary | ICD-10-CM | POA: Diagnosis not present

## 2016-06-29 DIAGNOSIS — M5416 Radiculopathy, lumbar region: Secondary | ICD-10-CM | POA: Diagnosis not present

## 2016-06-29 DIAGNOSIS — E782 Mixed hyperlipidemia: Secondary | ICD-10-CM | POA: Diagnosis present

## 2016-06-29 DIAGNOSIS — G4733 Obstructive sleep apnea (adult) (pediatric): Secondary | ICD-10-CM | POA: Diagnosis present

## 2016-06-29 DIAGNOSIS — R55 Syncope and collapse: Secondary | ICD-10-CM | POA: Diagnosis present

## 2016-06-29 DIAGNOSIS — Z7901 Long term (current) use of anticoagulants: Secondary | ICD-10-CM

## 2016-06-29 DIAGNOSIS — Z79899 Other long term (current) drug therapy: Secondary | ICD-10-CM

## 2016-06-29 DIAGNOSIS — I69354 Hemiplegia and hemiparesis following cerebral infarction affecting left non-dominant side: Secondary | ICD-10-CM

## 2016-06-29 DIAGNOSIS — Z993 Dependence on wheelchair: Secondary | ICD-10-CM

## 2016-06-29 DIAGNOSIS — E875 Hyperkalemia: Secondary | ICD-10-CM | POA: Diagnosis present

## 2016-06-29 DIAGNOSIS — N189 Chronic kidney disease, unspecified: Secondary | ICD-10-CM | POA: Diagnosis not present

## 2016-06-29 DIAGNOSIS — Z09 Encounter for follow-up examination after completed treatment for conditions other than malignant neoplasm: Secondary | ICD-10-CM | POA: Diagnosis not present

## 2016-06-29 DIAGNOSIS — D631 Anemia in chronic kidney disease: Secondary | ICD-10-CM | POA: Diagnosis not present

## 2016-06-29 DIAGNOSIS — Z8673 Personal history of transient ischemic attack (TIA), and cerebral infarction without residual deficits: Secondary | ICD-10-CM | POA: Diagnosis not present

## 2016-06-29 DIAGNOSIS — I4891 Unspecified atrial fibrillation: Secondary | ICD-10-CM | POA: Diagnosis not present

## 2016-06-29 DIAGNOSIS — I129 Hypertensive chronic kidney disease with stage 1 through stage 4 chronic kidney disease, or unspecified chronic kidney disease: Secondary | ICD-10-CM | POA: Diagnosis present

## 2016-06-29 DIAGNOSIS — R4182 Altered mental status, unspecified: Secondary | ICD-10-CM | POA: Diagnosis not present

## 2016-06-29 DIAGNOSIS — I480 Paroxysmal atrial fibrillation: Secondary | ICD-10-CM | POA: Diagnosis not present

## 2016-06-29 DIAGNOSIS — I10 Essential (primary) hypertension: Secondary | ICD-10-CM | POA: Diagnosis not present

## 2016-06-29 DIAGNOSIS — R930 Abnormal findings on diagnostic imaging of skull and head, not elsewhere classified: Secondary | ICD-10-CM | POA: Diagnosis not present

## 2016-06-29 DIAGNOSIS — J42 Unspecified chronic bronchitis: Secondary | ICD-10-CM | POA: Diagnosis not present

## 2016-06-29 HISTORY — DX: Syncope and collapse: R55

## 2016-06-29 HISTORY — DX: Myelodysplastic syndrome, unspecified: D46.9

## 2016-06-29 HISTORY — DX: Disease of spinal cord, unspecified: G95.9

## 2016-06-29 HISTORY — DX: Hypothyroidism, unspecified: E03.9

## 2016-06-29 HISTORY — DX: Obstructive sleep apnea (adult) (pediatric): G47.33

## 2016-06-29 HISTORY — DX: Bradycardia, unspecified: R00.1

## 2016-06-29 LAB — BASIC METABOLIC PANEL
Anion gap: 6 (ref 5–15)
BUN: 64 mg/dL — AB (ref 6–20)
CHLORIDE: 111 mmol/L (ref 101–111)
CO2: 25 mmol/L (ref 22–32)
CREATININE: 3.8 mg/dL — AB (ref 0.61–1.24)
Calcium: 8.9 mg/dL (ref 8.9–10.3)
GFR calc Af Amer: 16 mL/min — ABNORMAL LOW (ref >60)
GFR calc non Af Amer: 13 mL/min — ABNORMAL LOW (ref >60)
Glucose, Bld: 105 mg/dL — ABNORMAL HIGH (ref 65–99)
Potassium: 4.9 mmol/L (ref 3.5–5.1)
Sodium: 142 mmol/L (ref 135–145)

## 2016-06-29 LAB — TROPONIN I
TROPONIN I: 0.03 ng/mL — AB (ref <0.03)
Troponin I: 0.04 ng/mL (ref <0.03)

## 2016-06-29 LAB — CBC
HEMATOCRIT: 27.5 % — AB (ref 40.0–52.0)
Hemoglobin: 9 g/dL — ABNORMAL LOW (ref 13.0–18.0)
MCH: 30.1 pg (ref 26.0–34.0)
MCHC: 32.6 g/dL (ref 32.0–36.0)
MCV: 92.2 fL (ref 80.0–100.0)
PLATELETS: 181 10*3/uL (ref 150–440)
RBC: 2.98 MIL/uL — AB (ref 4.40–5.90)
RDW: 15.7 % — AB (ref 11.5–14.5)
WBC: 3.8 10*3/uL (ref 3.8–10.6)

## 2016-06-29 LAB — PROTIME-INR
INR: 4.09
Prothrombin Time: 40.7 seconds — ABNORMAL HIGH (ref 11.4–15.2)

## 2016-06-29 LAB — MAGNESIUM: Magnesium: 2.3 mg/dL (ref 1.7–2.4)

## 2016-06-29 MED ORDER — ONDANSETRON HCL 4 MG PO TABS: 4.0000 mg | ORAL_TABLET | Freq: Four times a day (QID) | ORAL | Status: DC | PRN

## 2016-06-29 MED ORDER — SODIUM CHLORIDE 0.9 % IV BOLUS (SEPSIS)
250.0000 mL | Freq: Once | INTRAVENOUS | Status: AC
  Administered 2016-06-29: 250 mL via INTRAVENOUS

## 2016-06-29 MED ORDER — ACETAMINOPHEN 325 MG PO TABS: 650.0000 mg | ORAL_TABLET | Freq: Four times a day (QID) | ORAL | Status: DC | PRN

## 2016-06-29 MED ORDER — ACETAMINOPHEN 650 MG RE SUPP: 650.0000 mg | Freq: Four times a day (QID) | RECTAL | Status: DC | PRN

## 2016-06-29 MED ORDER — LOSARTAN POTASSIUM 25 MG PO TABS
25.0000 mg | ORAL_TABLET | Freq: Every day | ORAL | Status: DC
  Administered 2016-06-29 – 2016-07-01 (×3): 25 mg via ORAL
  Filled 2016-06-29 (×3): qty 1

## 2016-06-29 MED ORDER — ONDANSETRON HCL 4 MG/2ML IJ SOLN: 4.0000 mg | Freq: Four times a day (QID) | INTRAMUSCULAR | Status: DC | PRN

## 2016-06-29 MED ORDER — VITAMIN B-12 100 MCG PO TABS
500.0000 ug | ORAL_TABLET | Freq: Two times a day (BID) | ORAL | Status: DC
  Administered 2016-06-30 – 2016-07-01 (×3): 500 ug via ORAL
  Filled 2016-06-29 (×3): qty 5

## 2016-06-29 MED ORDER — SODIUM CHLORIDE 0.9 % IV SOLN
INTRAVENOUS | Status: AC
  Administered 2016-06-29 – 2016-06-30 (×2): via INTRAVENOUS

## 2016-06-29 MED ORDER — SODIUM CHLORIDE 0.9% FLUSH
3.0000 mL | Freq: Two times a day (BID) | INTRAVENOUS | Status: DC
Start: 2016-06-29 — End: 2016-07-01
  Administered 2016-06-29 – 2016-07-01 (×4): 3 mL via INTRAVENOUS

## 2016-06-29 MED ORDER — CEPHALEXIN 250 MG PO CAPS
250.0000 mg | ORAL_CAPSULE | Freq: Two times a day (BID) | ORAL | Status: DC
  Administered 2016-06-29 – 2016-07-01 (×4): 250 mg via ORAL
  Filled 2016-06-29 (×4): qty 1

## 2016-06-29 NOTE — ED Notes (Addendum)
Per family pt had " syncopal like episode" at PCP today, this same symptoms happened last Thursday and was evaluated. Pt alert and responding to verbal stimuli

## 2016-06-29 NOTE — H&P (Addendum)
[employer] [facility] NAME: Kyle Vasquez    MR#:  [id number]  DATE OF BIRTH:  01-26-1932  DATE OF ADMISSION:  06/29/2016  PRIMARY CARE PHYSICIAN: Tracie Harrier, MD   REQUESTING/REFERRING PHYSICIAN: Dr. Rudene Re  CHIEF COMPLAINT:   Chief Complaint  Patient presents with  . Cardiac Arrest    HISTORY OF PRESENT ILLNESS:  Kyle Vasquez  is a 83 y.o. male with a known history of Paroxysmal atrial fibrillation with sick sinus syndrome, hypertension, mitral valve repair, history of stroke and on Coumadin, lumbar myelopathy with bilateral lower extremity weakness, hypertension, COPD not on home oxygen presents to the hospital secondary to a syncopal episode. Patient has had prior syncopal episodes and was noted to have history of bradycardia. No mention of pacemaker was ever done based on his records. He went to cover no to clinic today for follow-up from ER visit last week and questionably had a syncopal episode/arrest. 30 seconds of CPR with return of circulation. His heart rate was in the 30s at the time. Currently his heart rate is in the 50s and patient denies any lightheadedness or dizziness. He denies any aura prior to eat syncopal episode. No chest pain, palpitations or dizziness or lightheadedness. He has been in his normal state of health up until this morning. He was in the ER last week with dehydration and UTI and was discharged on Keflex. Denies any other complaints at this time. He is being admitted for his syncope and acute renal failure.  PAST MEDICAL HISTORY:   Past Medical History:  Diagnosis Date  . Atrial fibrillation ([place])   . Bradycardia   . Hypertension   . Hypothyroidism   . Lumbar myelopathy ([place])   . MDS (myelodysplastic syndrome) ([place])   . OSA (obstructive sleep apnea)   . Stroke [facility])    left sided weakness  . Syncope     PAST SURGICAL HISTORY:   Past Surgical History:  Procedure Laterality Date    . BACK SURGERY    . CARDIAC SURGERY     mitral valve surgery  . NECK SURGERY      SOCIAL HISTORY:   Social History  Substance Use Topics  . Smoking status: Former Research scientist (life sciences)  . Smokeless tobacco: Never Used  . Alcohol use No    FAMILY HISTORY:   Family History  Problem Relation Age of Onset  . Family history unknown: Yes  No relevant family history  DRUG ALLERGIES:  No Known Allergies  REVIEW OF SYSTEMS:   Review of Systems  Constitutional: Positive for malaise/fatigue. Negative for chills, fever and weight loss.  HENT: Negative for ear discharge, hearing loss and nosebleeds.   Eyes: Negative for blurred vision, double vision and photophobia.  Respiratory: Negative for cough, hemoptysis, shortness of breath and wheezing.   Cardiovascular: Negative for chest pain, palpitations, orthopnea and leg swelling.  Gastrointestinal: Negative for abdominal pain, constipation, diarrhea, melena, nausea and vomiting.  Genitourinary: Negative for dysuria and urgency.  Musculoskeletal: Positive for myalgias. Negative for back pain and neck pain.  Skin: Negative for rash.  Neurological: Negative for dizziness, tingling, tremors, sensory change, speech change and focal weakness.  Endo/Heme/Allergies: Does not bruise/bleed easily.  Psychiatric/Behavioral: Negative for depression.    MEDICATIONS AT HOME:   Prior to Admission medications   Medication Sig Start Date End Date Taking? Authorizing Provider  cephALEXin (KEFLEX) 500 MG capsule Take 1 capsule (500 mg total) by mouth 3 (three) times daily. 06/23/16 07/03/16  Yes Daymon Larsen, MD  furosemide (LASIX) 20 MG tablet Take 10 mg by mouth 2 (two) times daily.    Yes Historical Provider, MD  losartan (COZAAR) 25 MG tablet Take 25 mg by mouth daily.   Yes Historical Provider, MD  vitamin B-12 (CYANOCOBALAMIN) 1000 MCG tablet Take 500 mcg by mouth 2 (two) times daily.    Yes Historical Provider, MD  warfarin (COUMADIN) 4 MG tablet Take 4 mg  by mouth daily.    Yes Historical Provider, MD      VITAL SIGNS:  Blood pressure (!) 168/69, pulse (!) 55, resp. rate 12, height 6\' 5"  (1.956 m), weight 86.2 kg (190 lb), SpO2 100 %.  PHYSICAL EXAMINATION:   Physical Exam  GENERAL:  83 y.o.-year-old patient lying in the bed with no acute distress.  EYES: Pupils equal, round, reactive to light and accommodation. No scleral icterus. Extraocular muscles intact.  HEENT: Head atraumatic, normocephalic. Oropharynx and nasopharynx clear.  NECK:  Supple, no jugular venous distention. No thyroid enlargement, no tenderness.  LUNGS: Normal breath sounds bilaterally, no wheezing, rales,rhonchi or crepitation. No use of accessory muscles of respiration. Decreased bibasilar breath sounds CARDIOVASCULAR: S1, S2 normal. No rubs, or gallops. 3/6 systolic murmur present. ABDOMEN: Soft, nontender, nondistended. Bowel sounds present. No organomegaly or mass.  EXTREMITIES: No pedal edema, cyanosis, or clubbing.  NEUROLOGIC: Cranial nerves II through XII are intact. Muscle strength 5/5 in both upper  Extremities and 3/5 in both lower extremities. Sensation intact. Gait not checked.  PSYCHIATRIC: The patient is alert and oriented x 3.  SKIN: No obvious rash, lesion, or ulcer.   LABORATORY PANEL:   CBC  Recent Labs Lab 06/29/16 1623  WBC 3.8  HGB 9.0*  HCT 27.5*  PLT 181   ------------------------------------------------------------------------------------------------------------------  Chemistries   Recent Labs Lab 06/23/16 1420 06/29/16 1623  NA 140 142  K 4.9 4.9  CL 110 111  CO2 23 25  GLUCOSE 79 105*  BUN 59* 64*  CREATININE 3.49* 3.80*  CALCIUM 8.9 8.9  MG  --  2.3  AST 14*  --   ALT 10*  --   ALKPHOS 53  --   BILITOT 0.5  --    ------------------------------------------------------------------------------------------------------------------  Cardiac Enzymes  Recent Labs Lab 06/29/16 1623  TROPONINI 0.04*    ------------------------------------------------------------------------------------------------------------------  RADIOLOGY:  Dg Chest Portable 1 View  Result Date: 06/29/2016 CLINICAL DATA:  Cardiac arrest EXAM: PORTABLE CHEST 1 VIEW COMPARISON:  Chest radiograph 09/07/2015 FINDINGS: The cardiomediastinal silhouette remains enlarged. There is no focal airspace consolidation or pulmonary edema. There is atherosclerotic calcification in the aortic arch. There is linear atelectasis at the left lung base. No pneumothorax or sizable pleural effusion. IMPRESSION: 1. Persistent cardiomegaly and calcific aortic atherosclerosis without overt pulmonary edema. 2. Left lung base atelectasis. Electronically Signed   By: Ulyses Jarred M.D.   On: 06/29/2016 17:29    EKG:   Orders placed or performed during the hospital encounter of 06/29/16  . ED EKG within 10 minutes  . ED EKG within 10 minutes  . EKG 12-Lead  . EKG 12-Lead  . EKG 12-Lead  . EKG 12-Lead  . EKG 12-Lead  . EKG 12-Lead    IMPRESSION AND PLAN:   Dayshon Roback  is a 83 y.o. male with a known history of Paroxysmal atrial fibrillation with sick sinus syndrome, hypertension, mitral valve repair, history of stroke and on Coumadin, lumbar myelopathy with bilateral lower extremity weakness, hypertension, COPD not on home oxygen presents to  the hospital secondary to a syncopal episode.  #1 syncopal episode-secondary to arrhythmias and bradycardia. -Admit to telemetry, recycle troponins. Cardiology consulted. -Echocardiogram, CT head and carotid Dopplers ordered. Keep the pacer pads on  #2 paroxysmal atrial fibrillation-with bradycardia and sick sinus syndrome. Cardiology has been consulted. -Follow up echocardiogram - on coumadin for anticoagulation, bradycardic now- recurrent syncope- please evaluate for a pacemaker -Not on any rate limiting medications  #3 acute renal failure and CKD-Baseline creatinine gradually worsening and seems  to be around 2.9 with GFR of 22. -Creatinine from last week was still worse at 3.4, now with 3.8. -Hold Lasix. Check renal ultrasound. Continue losartan at this time. Hold if further worsening is noted. - nephrology consulted. -Gentle hydration is ordered  #4 hypertension-continue losartan. Hold Lasix at this time due to renal failure  #5 bilateral lower extremity weakness-secondary to lumbar myelopathy. Bed bound at baseline.  #6 DVT prophylaxis-already on Coumadin  #7 Acute cystitis- from last week, continue keflex- renally dosed? For 3 more days    All the records are reviewed and case discussed with ED provider. Management plans discussed with the patient, family and they are in agreement.  CODE STATUS: Full code  TOTAL TIME TAKING CARE OF THIS PATIENT: 50 minutes.    Gladstone Lighter M.D on 06/29/2016 at 6:13 PM  Between 7am to 6pm - Pager - [phone]  After 6pm go to [email or web address] - password [email or web address] [place] [facility]  Office  [phone]  CC: Primary care physician; Tracie Harrier, MD

## 2016-06-29 NOTE — ED Notes (Signed)
ED Provider at bedside. 

## 2016-06-29 NOTE — Progress Notes (Signed)
Pharmacist - Prescriber Communication  Keflex dose modified to 250 mg po BID x 3 more days due to creatinine clearance 15 to 20 mL/min and H&P note.  Reznor Ferrando A. [place], [place].D., BCPS Clinical Pharmacist 06/29/16 19:39

## 2016-06-29 NOTE — ED Notes (Signed)
Pt placed on defib pads

## 2016-06-29 NOTE — ED Notes (Signed)
Date and time results received: 06/29/16 1712 (use smartphrase ".now" to insert current time)  Test: troponin Critical Value: 0.04  Name of Provider Notified: [email or web address]  Orders Received? Or Actions Taken?:

## 2016-06-29 NOTE — Progress Notes (Signed)
ANTICOAGULATION CONSULT NOTE - Initial Consult  Pharmacy Consult for warfarin Indication: atrial fibrillation  No Known Allergies  Patient Measurements: Height: 6\' 5"  (195.6 cm) Weight: 190 lb (86.2 kg) IBW/kg (Calculated) : 89.1 Heparin Dosing Weight:   Vital Signs: BP: 168/69 (04/04 1730) Pulse Rate: 55 (04/04 1710)  Labs:  Recent Labs  06/29/16 1623  HGB 9.0*  HCT 27.5*  PLT 181  LABPROT 40.7*  INR 4.09*  CREATININE 3.80*  TROPONINI 0.04*    Estimated Creatinine Clearance: 18 mL/min (A) (by C-G formula based on SCr of 3.8 mg/dL (H)).   Medical History: Past Medical History:  Diagnosis Date  . Atrial fibrillation ([place])   . Bradycardia   . Hypertension   . Hypothyroidism   . Lumbar myelopathy ([place])   . MDS (myelodysplastic syndrome) ([place])   . OSA (obstructive sleep apnea)   . Stroke [facility])    left sided weakness  . Syncope     Medications:  Infusions:  . sodium chloride      Assessment: 85 yom cc cardiac arrest with PMH AF on VKA, HTN, stroke, symptomatic bradycardia. Was at PCP office for ED visit f/u, became unresponsive according to son, staff found no pulse. Patient regained pulse prior to any CPR or defib. Pharmacy consulted to continue VKA dosing for AF.   Goal of Therapy:  INR 2-3 Monitor platelets by anticoagulation protocol: Yes   Plan:  INR is supratherapeutic on admission. Hold dose for now. Will follow INR and resume dosing when INR <3.   Laural Benes, Pharm.D., BCPS Clinical Pharmacist 06/29/2016,6:15 PM

## 2016-06-29 NOTE — ED Triage Notes (Signed)
Pt from [facility] post arrest, approx 30 to 40 secs of CPR with return of circulation. Pt arrived with NRB alert to verbal stimuli

## 2016-06-29 NOTE — ED Provider Notes (Signed)
[facility] Emergency Department Provider Note  ____________________________________________  Time seen: Approximately 4:41 PM  I have reviewed the triage vital signs and the nursing notes.   HISTORY  Chief Complaint Cardiac Arrest   HPI Kyle Vasquez is a 83 y.o. male with a history of atrial fibrillation on coumadin, hypertension, stroke, symptomatic bradycardia who presents to the emergency room for post cardiac arrest. Patient was in his primary care doctor's office today for follow-up after being seen in the emergency department 6 days ago when he had a syncopal episode. According to the son patient was complaining that he was feeling dizzy and then became unresponsive. The staff check for pulse and no pulse was found. Per EMS, patient became unresponsive and brady down until no pulse was found. Patient regained consciousness prior to any CPR or defibrillation attempt. Patient has no recollection of the events. According to patient's son this is his third or fourth episode where he becomes unresponsive. At this time the patient is alert and oriented 3, moving all 4 extremities, following commands, he denies headache, chest pain, shortness of breath, back pain, abdominal pain, palpitations.  Past Medical History:  Diagnosis Date  . Atrial fibrillation ([place])   . Hypertension   . Stroke [facility])     Patient Active Problem List   Diagnosis Date Noted  . Bradycardia 09/08/2015  . Symptomatic bradycardia 09/07/2015  . Syncope and collapse 09/07/2015  . Atrial fibrillation ([place]) 09/07/2015  . HTN (hypertension) 09/07/2015  . Physical deconditioning 09/07/2015  . H/O: CVA (cerebrovascular accident) 09/07/2015    Past Surgical History:  Procedure Laterality Date  . BACK SURGERY    . CARDIAC SURGERY    . NECK SURGERY      Prior to Admission medications   Medication Sig Start Date End Date Taking? Authorizing Provider  cephALEXin (KEFLEX) 500 MG capsule  Take 1 capsule (500 mg total) by mouth 3 (three) times daily. 06/23/16 07/03/16 Yes Daymon Larsen, MD  furosemide (LASIX) 20 MG tablet Take 10 mg by mouth 2 (two) times daily.    Yes Historical Provider, MD  losartan (COZAAR) 25 MG tablet Take 25 mg by mouth daily.   Yes Historical Provider, MD  vitamin B-12 (CYANOCOBALAMIN) 1000 MCG tablet Take 500 mcg by mouth 2 (two) times daily.    Yes Historical Provider, MD  warfarin (COUMADIN) 4 MG tablet Take 4 mg by mouth daily.    Yes Historical Provider, MD    Allergies Patient has no known allergies.  No family history on file.  Social History Social History  Substance Use Topics  . Smoking status: Former Research scientist (life sciences)  . Smokeless tobacco: Never Used  . Alcohol use No    Review of Systems  Constitutional: Negative for fever. + cardiac arrest Eyes: Negative for visual changes. ENT: Negative for sore throat. Neck: No neck pain  Cardiovascular: Negative for chest pain. Respiratory: Negative for shortness of breath. Gastrointestinal: Negative for abdominal pain, vomiting or diarrhea. Genitourinary: Negative for dysuria. Musculoskeletal: Negative for back pain. Skin: Negative for rash. Neurological: Negative for headaches, weakness or numbness. Psych: No SI or HI  ____________________________________________   PHYSICAL EXAM:  VITAL SIGNS: ED Triage Vitals  Enc Vitals Group     BP 06/29/16 1630 (!) 143/71     Pulse Rate 06/29/16 1621 61     Resp 06/29/16 1621 18     Temp --      Temp src --      SpO2 06/29/16 1634  100 %     Weight 06/29/16 1622 190 lb (86.2 kg)     Height 06/29/16 1622 6\' 5"  (1.956 m)     Head Circumference --      Peak Flow --      Pain Score --      Pain Loc --      Pain Edu? --      Excl. in [place]? --     Constitutional: Alert and oriented. Well appearing and in no apparent distress. HEENT:      Head: Normocephalic and atraumatic.         Eyes: Conjunctivae are normal. Sclera is non-icteric. EOMI. PERRL       Mouth/Throat: Mucous membranes are moist.       Neck: Supple with no signs of meningismus. Cardiovascular: Irregularly irregular rhythm with bradycardic rate. No murmurs, gallops, or rubs. 2+ symmetrical distal pulses are present in all extremities. No JVD. Respiratory: Normal respiratory effort. Lungs are clear to auscultation bilaterally. No wheezes, crackles, or rhonchi.  Gastrointestinal: Soft, non tender, and non distended with positive bowel sounds. No rebound or guarding. Musculoskeletal: Nontender with normal range of motion in all extremities. No edema, cyanosis, or erythema of extremities. Neurologic: Normal speech and language. Face is symmetric. Moving all extremities. No gross focal neurologic deficits are appreciated. Skin: Skin is warm, dry and intact. No rash noted. Psychiatric: Mood and affect are normal. Speech and behavior are normal.  ____________________________________________   LABS (all labs ordered are listed, but only abnormal results are displayed)  Labs Reviewed  BASIC METABOLIC PANEL - Abnormal; Notable for the following:       Result Value   Glucose, Bld 105 (*)    BUN 64 (*)    Creatinine, Ser 3.80 (*)    GFR calc non Af Amer 13 (*)    GFR calc Af Amer 16 (*)    All other components within normal limits  CBC - Abnormal; Notable for the following:    RBC 2.98 (*)    Hemoglobin 9.0 (*)    HCT 27.5 (*)    RDW 15.7 (*)    All other components within normal limits  TROPONIN I - Abnormal; Notable for the following:    Troponin I 0.04 (*)    All other components within normal limits  PROTIME-INR - Abnormal; Notable for the following:    Prothrombin Time 40.7 (*)    INR 4.09 (*)    All other components within normal limits  MAGNESIUM   ____________________________________________  EKG  ED ECG REPORT I, Rudene Re, the attending physician, personally viewed and interpreted this ECG.  Atrial fibrillation, rate of 53, normal QRS and QTc  intervals, left axis deviation, no ST elevations or depressions ____________________________________________  RADIOLOGY  CXR: 1. Persistent cardiomegaly and calcific aortic atherosclerosis without overt pulmonary edema. 2. Left lung base atelectasis  ____________________________________________   PROCEDURES  Procedure(s) performed: None Procedures Critical Care performed:  None ____________________________________________   INITIAL IMPRESSION / ASSESSMENT AND PLAN / ED COURSE  83 y.o. male with a history of atrial fibrillation on coumadin, hypertension, stroke, symptomatic bradycardia who presents to the emergency room for post cardiac arrest vs syncope in the setting of severe bradycardia. Patient is alert and oriented 3, GCS of 15 upon arrival to the emergency room. EKG with no evidence of ischemia. Patient was connected to defibrillator. Patient is noted to be in atrial fibrillation with ventricular rate varying from 30s to 70s. According to the family this  has happened many times and patient has never required shocking or CPR which makes me believe this is unlikely to be a cardiac arrest most likely profound bradycardia causing syncope. We'll check his electrolytes, cylce cardiac enzymes, and admit patient for evaluation for possible pacemaker placement.    _________________________ 5:36 PM on 06/29/2016 -----------------------------------------  Patient remained stable. Normal electrolytes. Worsening acute on chronic kidney injury. Stable anemia. Supratherapeutic INR with no evidence of bleeding. We'll admit to hospitalist service.  Pertinent labs & imaging results that were available during my care of the patient were reviewed by me and considered in my medical decision making (see chart for details).    ____________________________________________   FINAL CLINICAL IMPRESSION(S) / ED DIAGNOSES  Final diagnoses:  Syncope and collapse  Acute renal failure superimposed on  chronic kidney disease, unspecified CKD stage, unspecified acute renal failure type ([place])      NEW MEDICATIONS STARTED DURING THIS VISIT:  New Prescriptions   No medications on file     Note:  This document was prepared using Dragon voice recognition software and may include unintentional dictation errors.    Rudene Re, MD 06/29/16 [phone]

## 2016-06-29 NOTE — ED Notes (Signed)
Date and time results received: 06/29/16 1715  (use smartphrase ".now" to insert current time)  Test: inr Critical Value: 4.01  Name of Provider Notified: [email or web address]  Orders Received? Or Actions Taken?:

## 2016-06-30 ENCOUNTER — Inpatient Hospital Stay: Payer: Medicare HMO

## 2016-06-30 LAB — PHOSPHORUS: PHOSPHORUS: 3.1 mg/dL (ref 2.5–4.6)

## 2016-06-30 LAB — CBC
HCT: 25.5 % — ABNORMAL LOW (ref 40.0–52.0)
HEMOGLOBIN: 8.4 g/dL — AB (ref 13.0–18.0)
MCH: 29.4 pg (ref 26.0–34.0)
MCHC: 32.8 g/dL (ref 32.0–36.0)
MCV: 89.8 fL (ref 80.0–100.0)
Platelets: 173 10*3/uL (ref 150–440)
RBC: 2.84 MIL/uL — AB (ref 4.40–5.90)
RDW: 15.9 % — ABNORMAL HIGH (ref 11.5–14.5)
WBC: 3.5 10*3/uL — ABNORMAL LOW (ref 3.8–10.6)

## 2016-06-30 LAB — ECHOCARDIOGRAM COMPLETE
Height: 77 in
Weight: 3040 oz

## 2016-06-30 LAB — BASIC METABOLIC PANEL
ANION GAP: 6 (ref 5–15)
BUN: 61 mg/dL — ABNORMAL HIGH (ref 6–20)
CALCIUM: 8.7 mg/dL — AB (ref 8.9–10.3)
CO2: 23 mmol/L (ref 22–32)
CREATININE: 3.66 mg/dL — AB (ref 0.61–1.24)
Chloride: 113 mmol/L — ABNORMAL HIGH (ref 101–111)
GFR, EST AFRICAN AMERICAN: 16 mL/min — AB (ref >60)
GFR, EST NON AFRICAN AMERICAN: 14 mL/min — AB (ref >60)
Glucose, Bld: 74 mg/dL (ref 65–99)
Potassium: 5.3 mmol/L — ABNORMAL HIGH (ref 3.5–5.1)
SODIUM: 142 mmol/L (ref 135–145)

## 2016-06-30 LAB — PROTIME-INR
INR: 4.26 — AB
PROTHROMBIN TIME: 42.1 s — AB (ref 11.4–15.2)

## 2016-06-30 LAB — TROPONIN I
TROPONIN I: 0.04 ng/mL — AB (ref <0.03)
Troponin I: 0.04 ng/mL (ref <0.03)

## 2016-06-30 NOTE — Progress Notes (Signed)
PT Cancellation Note  Patient Details Name: Kyle Vasquez MRN: [id number] DOB: 08-23-1931   Cancelled Treatment:    Reason Eval/Treat Not Completed: Other (comment).  PT consult received.  Chart reviewed.  Pt's potassium noted to be elevated to 5.3 today.  Per PT guidelines for elevated potassium, will hold PT eval at this time and re-attempt PT eval at a later date/time as medically appropriate.  Leitha Bleak, PT 06/30/16, 3:27 PM [phone]

## 2016-06-30 NOTE — Consult Note (Signed)
[facility] CONSULT NOTE    Date: 06/30/2016                  Patient Name:  Kyle Vasquez  MRN: [id number]  DOB: 1932/03/06  Age / Sex: 83 y.o., male         PCP: Tracie Harrier, MD                 Service Requesting Consult: Hospitalist                 Reason for Consult: Acute renal failure, CKD stage IV            History of Present Illness: Patient is a 83 y.o. male with a PMHx of Atrial fibrillation, bradycardia, hypertension, hypothyroidism, myelodysplastic syndrome, obstructive sleep apnea, stroke with left-sided weakness, history of syncope, chronic kidney disease stage IV baseline EGFR 22, who was admitted to [facility] on 06/29/2016 for evaluation of syncope vs unresponsive episode. The patient went to see his primary care physician on 06/29/16. While waiting to be seen the patient had either a syncopal episode or possible cardiac arrest. Patient redeveloped circulation after 30 seconds. His heart rate subsequently was found to be in the 30s. We are asked to see him for underlying chronic kidney disease. His baseline creatinine appears to be 2.9 with an EGFR of 22. He has not seen a nephrologist in the past. The patient is quite debilitated and bedbound. He has difficulty getting to physician appointments.   Medications: Outpatient medications: Prescriptions Prior to Admission  Medication Sig Dispense Refill Last Dose  . cephALEXin (KEFLEX) 500 MG capsule Take 1 capsule (500 mg total) by mouth 3 (three) times daily. 21 capsule 0 06/29/2016 at am  . furosemide (LASIX) 20 MG tablet Take 10 mg by mouth 2 (two) times daily.    06/29/2016 at am  . losartan (COZAAR) 25 MG tablet Take 25 mg by mouth daily.   06/28/2016 at pm  . vitamin B-12 (CYANOCOBALAMIN) 1000 MCG tablet Take 500 mcg by mouth 2 (two) times daily.    06/29/2016 at am  . warfarin (COUMADIN) 4 MG tablet Take 4 mg by mouth daily.    06/28/2016 at pm    Current medications: Current Facility-Administered  Medications  Medication Dose Route Frequency Provider Last Rate Last Dose  . 0.9 %  sodium chloride infusion   Intravenous Continuous Gladstone Lighter, MD 50 mL/hr at 06/29/16 2027    . acetaminophen (TYLENOL) tablet 650 mg  650 mg Oral Q6H PRN Gladstone Lighter, MD       Or  . acetaminophen (TYLENOL) suppository 650 mg  650 mg Rectal Q6H PRN Gladstone Lighter, MD      . cephALEXin (KEFLEX) capsule 250 mg  250 mg Oral BID Gladstone Lighter, MD   250 mg at 06/30/16 0834  . losartan (COZAAR) tablet 25 mg  25 mg Oral Daily Gladstone Lighter, MD   25 mg at 06/30/16 7619  . ondansetron (ZOFRAN) tablet 4 mg  4 mg Oral Q6H PRN Gladstone Lighter, MD       Or  . ondansetron (ZOFRAN) injection 4 mg  4 mg Intravenous Q6H PRN Gladstone Lighter, MD      . sodium chloride flush (NS) 0.9 % injection 3 mL  3 mL Intravenous Q12H Gladstone Lighter, MD   3 mL at 06/30/16 0834  . vitamin B-12 (CYANOCOBALAMIN) tablet 500 mcg  500 mcg Oral BID Gladstone Lighter, MD   500 mcg at 06/30/16 [phone]  Allergies: No Known Allergies    Past Medical History: Past Medical History:  Diagnosis Date  . Atrial fibrillation ([place])   . Bradycardia   . Hypertension   . Hypothyroidism   . Lumbar myelopathy ([place])   . MDS (myelodysplastic syndrome) ([place])   . OSA (obstructive sleep apnea)   . Stroke [facility])    left sided weakness  . Syncope      Past Surgical History: Past Surgical History:  Procedure Laterality Date  . BACK SURGERY    . CARDIAC SURGERY     mitral valve surgery  . NECK SURGERY       Family History: Family History  Problem Relation Age of Onset  . Family history unknown: Yes     Social History: Social History   Social History  . Marital status: Married    Spouse name: N/A  . Number of children: N/A  . Years of education: N/A   Occupational History  . Not on file.   Social History Main Topics  . Smoking status: Former Research scientist (life sciences)  . Smokeless tobacco: Never Used  . Alcohol use No   . Drug use: Unknown  . Sexual activity: Not on file   Other Topics Concern  . Not on file   Social History Narrative   Lives at home with wife, bedbound at baseline     Review of Systems: Review of Systems  Constitutional: Negative for chills, fever, malaise/fatigue and weight loss.  HENT: Negative for ear pain, hearing loss and tinnitus.   Eyes: Negative for blurred vision, double vision and photophobia.  Respiratory: Negative for cough, hemoptysis and sputum production.   Cardiovascular: Positive for palpitations. Negative for chest pain and leg swelling.  Gastrointestinal: Negative for heartburn, nausea and vomiting.  Genitourinary: Negative for dysuria, frequency and urgency.  Musculoskeletal: Positive for back pain. Negative for myalgias.  Skin: Negative for itching and rash.  Neurological: Positive for focal weakness. Negative for dizziness.  Endo/Heme/Allergies: Positive for polydipsia. Does not bruise/bleed easily.  Psychiatric/Behavioral: Negative for depression. The patient does not have insomnia.      Vital Signs: Blood pressure (!) 147/99, pulse (!) 103, temperature 97.5 F (36.4 C), temperature source Oral, resp. rate 14, height _0  (1.956 m), weight 86.2 kg (190 lb), SpO2 97 %.  Weight trends: Filed Weights   06/29/16 1622  Weight: 86.2 kg (190 lb)    Physical Exam: General: NAD, laying in bed  Head: Normocephalic, atraumatic.  Eyes: Anicteric, EOMI  Nose: Mucous membranes moist, not inflammed, nonerythematous.  Throat: Oropharynx nonerythematous, no exudate appreciated.   Neck: Supple, trachea midline.  Lungs:  Normal respiratory effort. Clear to auscultation BL without crackles or wheezes.  Heart: S1S2 irregular  Abdomen:  BS normoactive. Soft, Nondistended, non-tender.  No masses or organomegaly.  Extremities: No pretibial edema.  Neurologic: Awake, alert, follows commands, left sided weakness noted.  Skin: No visible rashes, scars.    Lab  results: Basic Metabolic Panel:  Recent Labs Lab 06/23/16 1420 06/29/16 1623 06/30/16 0617  NA 140 142 142  K 4.9 4.9 5.3*  CL 110 111 113*  CO2 _1 GLUCOSE 79 105* 74  BUN 59* 64* 61*  CREATININE 3.49* 3.80* 3.66*  CALCIUM 8.9 8.9 8.7*  MG  --  2.3  --     Liver Function Tests:  Recent Labs Lab 06/23/16 1420  AST 14*  ALT 10*  ALKPHOS 53  BILITOT 0.5  PROT 6.9  ALBUMIN 3.0*   No results  for input(s): LIPASE, AMYLASE in the last 168 hours. No results for input(s): AMMONIA in the last 168 hours.  CBC:  Recent Labs Lab 06/23/16 1420 06/29/16 1623 06/30/16 0617  WBC 3.9 3.8 3.5*  NEUTROABS 2.4  --   --   HGB 8.8* 9.0* 8.4*  HCT 26.1* 27.5* 25.5*  MCV 91.2 92.2 89.8  PLT 166 181 173    Cardiac Enzymes:  Recent Labs Lab 06/23/16 1420 06/29/16 1623 06/29/16 1912 06/30/16 0041 06/30/16 0617  TROPONINI 0.04* 0.04* 0.03* 0.04* 0.04*    BNP: Invalid input(s): POCBNP  CBG: No results for input(s): GLUCAP in the last 168 hours.  Microbiology: Results for orders placed or performed during the hospital encounter of 06/23/16  Urine culture     Status: Abnormal   Collection Time: 06/23/16  3:10 PM  Result Value Ref Range Status   Specimen Description URINE, CLEAN CATCH  Final   Special Requests NONE  Final   Culture >=100,000 COLONIES/mL ESCHERICHIA COLI (A)  Final   Report Status 06/26/2016 FINAL  Final   Organism ID, Bacteria ESCHERICHIA COLI (A)  Final      Susceptibility   Escherichia coli - MIC*    AMPICILLIN >=32 RESISTANT Resistant     CEFAZOLIN <=4 SENSITIVE Sensitive     CEFTRIAXONE <=1 SENSITIVE Sensitive     CIPROFLOXACIN 0.5 SENSITIVE Sensitive     GENTAMICIN <=1 SENSITIVE Sensitive     IMIPENEM <=0.25 SENSITIVE Sensitive     NITROFURANTOIN <=16 SENSITIVE Sensitive     TRIMETH/SULFA <=20 SENSITIVE Sensitive     AMPICILLIN/SULBACTAM >=32 RESISTANT Resistant     PIP/TAZO <=4 SENSITIVE Sensitive     Extended ESBL NEGATIVE  Sensitive     * >=100,000 COLONIES/mL ESCHERICHIA COLI    Coagulation Studies:  Recent Labs  06/29/16 1623  LABPROT 40.7*  INR 4.09*    Urinalysis: No results for input(s): COLORURINE, LABSPEC, PHURINE, GLUCOSEU, HGBUR, BILIRUBINUR, KETONESUR, PROTEINUR, UROBILINOGEN, NITRITE, LEUKOCYTESUR in the last 72 hours.  Invalid input(s): APPERANCEUR    Imaging: Ct Head Wo Contrast  Result Date: 06/29/2016 CLINICAL DATA:  Cardiac arrest today at primary care physician's office. Patient spontaneously regained consciousness. Recent recurrent syncopal episodes. History of atrial fibrillation on Coumadin, hypertension, symptomatic bradycardia. EXAM: CT HEAD WITHOUT CONTRAST TECHNIQUE: Contiguous axial images were obtained from the base of the skull through the vertex without intravenous contrast. COMPARISON:  CT HEAD January 06, 2016 FINDINGS: BRAIN: No intraparenchymal hemorrhage, mass effect nor midline shift. Severe ventriculomegaly with disproportionate sulcal effacement at the convexities. RIGHT temporal occipital lobe encephalomalacia. Small area LEFT occipital lobe encephalomalacia. Small area bifrontal encephalomalacia. Old RIGHT superior cerebellar infarct. Confluent supratentorial and patchy pontine white matter hypodensities. No abnormal extra-axial fluid collections. VASCULAR: Moderate calcific atherosclerosis of the carotid siphons. SKULL: No skull fracture. No significant scalp soft tissue swelling. SINUSES/ORBITS: The mastoid air-cells and included paranasal sinuses are well-aerated. Soft tissue within LEFT external auditory canal most compatible with cerumen. The included ocular globes and orbital contents are non-suspicious. OTHER: None. IMPRESSION: 1. No acute intracranial process. 2. Severe ventriculomegaly and suspected normal pressure hydrocephalus. 3. Old bilateral PCA, RIGHT superior cerebellar artery territory infarcts. Old small bifrontal frontal lobe infarcts versus old contusions.  4. Severe chronic small vessel ischemic disease though, there may be a component of interstitial edema. Electronically Signed   By: Elon Alas M.D.   On: 06/29/2016 19:03   US Renal  Result Date: 06/30/2016 CLINICAL DATA:  Acute renal failure. EXAM: RENAL / URINARY TRACT  ULTRASOUND COMPLETE COMPARISON:  CT chest, abdomen and pelvis 03/28/2013. FINDINGS: Right Kidney: Length: 7.8 cm. Cortical echogenicity is increased. No hydronephrosis or focal lesion. Left Kidney: Length: 8.1 cm. Cortical echogenicity is increased. No focal lesion for hydronephrosis. Bladder: The posterior bladder wall appears mildly thickened. IMPRESSION: Negative for hydronephrosis. Increased cortical echogenicity bilaterally consistent with medical renal disease. Electronically Signed   By: Inge Rise M.D.   On: 06/30/2016 10:38   US Carotid Bilateral  Result Date: 06/30/2016 CLINICAL DATA:  Syncope. EXAM: BILATERAL CAROTID DUPLEX ULTRASOUND TECHNIQUE: Pearline Cables scale imaging, color Doppler and duplex ultrasound were performed of bilateral carotid and vertebral arteries in the neck. COMPARISON:  CT 06/29/2016 . FINDINGS: Criteria: Quantification of carotid stenosis is based on velocity parameters that correlate the residual internal carotid diameter with NASCET-based stenosis levels, using the diameter of the distal internal carotid lumen as the denominator for stenosis measurement. The following velocity measurements were obtained: RIGHT ICA:  70/23 cm/sec CCA:  21/97 cm/sec SYSTOLIC ICA/CCA RATIO:  0.8 DIASTOLIC ICA/CCA RATIO:  0.7 ECA:  122 cm/sec LEFT ICA:  69/30 cm/sec CCA:  58/83 cm/sec SYSTOLIC ICA/CCA RATIO:  0.8 DIASTOLIC ICA/CCA RATIO:  1.1 ECA:  90 cm/sec RIGHT CAROTID ARTERY: Diffuse moderate right common carotid, carotid bifurcation, proximal ICA atherosclerotic vascular disease. No flow limiting stenosis. RIGHT VERTEBRAL ARTERY:  Patent with antegrade flow. LEFT CAROTID ARTERY: Diffuse moderate left common carotid,  carotid bifurcation, proximal ICA atherosclerotic vascular disease. No flow limiting stenosis . LEFT VERTEBRAL ARTERY:  Patent with antegrade flow. Cardiac arrhythmia cannot be excluded. IMPRESSION: 1. Diffuse moderate bilateral common carotid, carotid bifurcation, proximal ICA sclerotic vascular disease. No flow limiting stenosis. Degree of stenosis less than 50% bilaterally. 2. Vertebrals are patent with antegrade flow. 3. Cardiac arrhythmia cannot be excluded. Electronically Signed   By: Marcello Moores  Register   On: 06/30/2016 10:41   Dg Chest Portable 1 View  Result Date: 06/29/2016 CLINICAL DATA:  Cardiac arrest EXAM: PORTABLE CHEST 1 VIEW COMPARISON:  Chest radiograph 09/07/2015 FINDINGS: The cardiomediastinal silhouette remains enlarged. There is no focal airspace consolidation or pulmonary edema. There is atherosclerotic calcification in the aortic arch. There is linear atelectasis at the left lung base. No pneumothorax or sizable pleural effusion. IMPRESSION: 1. Persistent cardiomegaly and calcific aortic atherosclerosis without overt pulmonary edema. 2. Left lung base atelectasis. Electronically Signed   By: Ulyses Jarred M.D.   On: 06/29/2016 17:29      Assessment & Plan: Pt is a 83 y.o. male with a PMHx of Atrial fibrillation, bradycardia, hypertension, hypothyroidism, myelodysplastic syndrome, obstructive sleep apnea, stroke with left-sided weakness, history of syncope, chronic kidney disease stage IV baseline EGFR 22, who was admitted to [facility] on 06/29/2016 for evaluation of syncope vs unresponsive episode.  1.  Acute renal failure/CKD stage IV baseline Cr 2.9/proteinuria. Renal ultrasound reveals bilateral atrophic kidneys with increased echogenicity. Renal function worse now with a creatinine of 3.66.  Agree with continued IV fluid hydration with 0.9 normal saline.  Check SPEP, UPEP, ANA, ANCA, GBMs, C3/C4. Patient does have history of myelodysplastic syndrome.  Follow renal function daily, avoid  nephrotoxins as possible.  2.  Anemia of CKD. Hemoglobin currently 8.4.  We may need to consider use of Epogen.  3.  Hyperkalemia.  Potassium currently 5.3. We will continue to monitor. If this rises we may need to consider use of patiromer.   4.  Syncope vs unresponsive episode. As per hospitalist and cardiology.

## 2016-06-30 NOTE — Consult Note (Signed)
[facility] Consultation Note  Patient ID: Kyle Vasquez, MRN: [id number], DOB/AGE: [birth year] 83 y.o. Admit date: 06/29/2016   Date of Consult: 06/30/2016 Primary Physician: Tracie Harrier, MD Primary Cardiologist: Ubaldo Glassing  Chief Complaint:  Chief Complaint  Patient presents with  . Cardiac Arrest   Reason for Consult: unresponsiveness possible syncope  HPI: 83 y.o. male with known chronic nonvalvular atrial fibrillation with variable heart rate, essential hypertension, mixed hyperlipidemia, upset obstructive sleep apnea, peripheral vascular disease with stroke, mitral valve repair in the past for which the patient has had multiple episodes of unresponsiveness over the last several months. He has been in the emergency room 3 times for which showed he has had full workup and has had no evidence of new stroke or myocardial infarction or heart failure. During these evaluations the patient has had chronic L atrial fibrillation with variable heart rate but no evidence of heart block or sick sinus syndrome or symptomatic bradycardia that would've caused his syncopal episode. The patient therefore is unlikely to have had a primary cardiac event at this time with rhythm at not being noted to 6 sinus syndrome and/or heart block or symptomatic bradycardia while the patient was unconscious. The patient has had a recent echocardiogram showing no evidence of a primary cause of syncope and/or unresponsiveness with normal LV systolic function and mitral valve repair with only minimal mitral regurgitation and no evidence of congestive heart failure or previous myocardial infarction. The patient is on antihypertensive which may contribute to hypotension which could cause his unresponsiveness on any other medication management. He has been on warfarin for further risk reduction in stroke without bleeding complications. He has had a troponin level of 0.04 without evidence of myocardial infarction at this  time due to apparently have chronic obstructive pulmonary disease will on apparent home oxygen which could cause hypoxia and unresponsive and this although this is not clear. Additional possibilities include obstructive sleep apnea. Currently the patient is feeling better and has no knowledge of any of this unresponsiveness  Past Medical History:  Diagnosis Date  . Atrial fibrillation ([place])   . Bradycardia   . Hypertension   . Hypothyroidism   . Lumbar myelopathy ([place])   . MDS (myelodysplastic syndrome) ([place])   . OSA (obstructive sleep apnea)   . Stroke [facility])    left sided weakness  . Syncope       Surgical History:  Past Surgical History:  Procedure Laterality Date  . BACK SURGERY    . CARDIAC SURGERY     mitral valve surgery  . NECK SURGERY       Home Meds: Prior to Admission medications   Medication Sig Start Date End Date Taking? Authorizing Provider  cephALEXin (KEFLEX) 500 MG capsule Take 1 capsule (500 mg total) by mouth 3 (three) times daily. 06/23/16 07/03/16 Yes Daymon Larsen, MD  furosemide (LASIX) 20 MG tablet Take 10 mg by mouth 2 (two) times daily.    Yes Historical Provider, MD  losartan (COZAAR) 25 MG tablet Take 25 mg by mouth daily.   Yes Historical Provider, MD  vitamin B-12 (CYANOCOBALAMIN) 1000 MCG tablet Take 500 mcg by mouth 2 (two) times daily.    Yes Historical Provider, MD  warfarin (COUMADIN) 4 MG tablet Take 4 mg by mouth daily.    Yes Historical Provider, MD    Inpatient Medications:  . cephALEXin  250 mg Oral BID  . losartan  25 mg Oral Daily  . sodium chloride flush  3 mL  Intravenous Q12H  . vitamin B-12  500 mcg Oral BID   . sodium chloride 50 mL/hr at 06/29/16 2027    Allergies: No Known Allergies  Social History   Social History  . Marital status: Married    Spouse name: N/A  . Number of children: N/A  . Years of education: N/A   Occupational History  . Not on file.   Social History Main Topics  . Smoking status: Former Research scientist (life sciences)   . Smokeless tobacco: Never Used  . Alcohol use No  . Drug use: Unknown  . Sexual activity: Not on file   Other Topics Concern  . Not on file   Social History Narrative   Lives at home with wife, bedbound at baseline     Family History  Problem Relation Age of Onset  . Family history unknown: Yes     Review of Systems Positive for None Negative for: General:  chills, fever, night sweats or weight changes.  Cardiovascular: PND orthopnea syncope dizziness  Dermatological skin lesions rashes Respiratory: Cough congestion Urologic: Frequent urination urination at night and hematuria Abdominal: negative for nausea, vomiting, diarrhea, bright red blood per rectum, melena, or hematemesis Neurologic: negative for visual changes, and/or hearing changes  All other systems reviewed and are otherwise negative except as noted above.  Labs:  Recent Labs  06/29/16 1623 06/29/16 1912 06/30/16 0041 06/30/16 0617  TROPONINI 0.04* 0.03* 0.04* 0.04*   Lab Results  Component Value Date   WBC 3.5 (L) 06/30/2016   HGB 8.4 (L) 06/30/2016   HCT 25.5 (L) 06/30/2016   MCV 89.8 06/30/2016   PLT 173 06/30/2016    Recent Labs Lab 06/23/16 1420  06/30/16 0617  NA 140  < > 142  K 4.9  < > 5.3*  CL 110  < > 113*  CO2 23  < > 23  BUN 59*  < > 61*  CREATININE 3.49*  < > 3.66*  CALCIUM 8.9  < > 8.7*  PROT 6.9  --   --   BILITOT 0.5  --   --   ALKPHOS 53  --   --   ALT 10*  --   --   AST 14*  --   --   GLUCOSE 79  < > 74  < > = values in this interval not displayed. No results found for: CHOL, HDL, LDLCALC, TRIG No results found for: DDIMER  Radiology/Studies:  Ct Head Wo Contrast  Result Date: 06/29/2016 CLINICAL DATA:  Cardiac arrest today at primary care physician's office. Patient spontaneously regained consciousness. Recent recurrent syncopal episodes. History of atrial fibrillation on Coumadin, hypertension, symptomatic bradycardia. EXAM: CT HEAD WITHOUT CONTRAST TECHNIQUE:  Contiguous axial images were obtained from the base of the skull through the vertex without intravenous contrast. COMPARISON:  CT HEAD January 06, 2016 FINDINGS: BRAIN: No intraparenchymal hemorrhage, mass effect nor midline shift. Severe ventriculomegaly with disproportionate sulcal effacement at the convexities. RIGHT temporal occipital lobe encephalomalacia. Small area LEFT occipital lobe encephalomalacia. Small area bifrontal encephalomalacia. Old RIGHT superior cerebellar infarct. Confluent supratentorial and patchy pontine white matter hypodensities. No abnormal extra-axial fluid collections. VASCULAR: Moderate calcific atherosclerosis of the carotid siphons. SKULL: No skull fracture. No significant scalp soft tissue swelling. SINUSES/ORBITS: The mastoid air-cells and included paranasal sinuses are well-aerated. Soft tissue within LEFT external auditory canal most compatible with cerumen. The included ocular globes and orbital contents are non-suspicious. OTHER: None. IMPRESSION: 1. No acute intracranial process. 2. Severe ventriculomegaly and suspected normal pressure  hydrocephalus. 3. Old bilateral PCA, RIGHT superior cerebellar artery territory infarcts. Old small bifrontal frontal lobe infarcts versus old contusions. 4. Severe chronic small vessel ischemic disease though, there may be a component of interstitial edema. Electronically Signed   By: Elon Alas M.D.   On: 06/29/2016 19:03   Dg Chest Portable 1 View  Result Date: 06/29/2016 CLINICAL DATA:  Cardiac arrest EXAM: PORTABLE CHEST 1 VIEW COMPARISON:  Chest radiograph 09/07/2015 FINDINGS: The cardiomediastinal silhouette remains enlarged. There is no focal airspace consolidation or pulmonary edema. There is atherosclerotic calcification in the aortic arch. There is linear atelectasis at the left lung base. No pneumothorax or sizable pleural effusion. IMPRESSION: 1. Persistent cardiomegaly and calcific aortic atherosclerosis without overt  pulmonary edema. 2. Left lung base atelectasis. Electronically Signed   By: Ulyses Jarred M.D.   On: 06/29/2016 17:29    EKG: Atrial fibrillation with controlled ventricular rate and nonspecific ST and T-wave changes  Weights: Filed Weights   06/29/16 1622  Weight: 86.2 kg (190 lb)     Physical Exam: Blood pressure (!) 151/78, pulse 67, temperature 98.1 F (36.7 C), temperature source Oral, resp. rate 16, height 6\' 5"  (1.956 m), weight 86.2 kg (190 lb), SpO2 99 %. Body mass index is 22.53 kg/m. General: Well developed, well nourished, in no acute distress. Head eyes ears nose throat: Normocephalic, atraumatic, sclera non-icteric, no xanthomas, nares are without discharge. No apparent thyromegaly and/or mass  Lungs: Normal respiratory effort.  no wheezes, no rales, no rhonchi.  Heart: Irregular with normal S1 S2. no murmur gallop, no rub, PMI is normal size and placement, carotid upstroke normal without bruit, jugular venous pressure is normal Abdomen: Soft, non-tender, non-distended with normoactive bowel sounds. No hepatomegaly. No rebound/guarding. No obvious abdominal masses. Abdominal aorta is normal size without bruit Extremities: Trace edema. no cyanosis, no clubbing, no ulcers  Peripheral : 2+ bilateral upper extremity pulses, 2+ bilateral femoral pulses, 2+ bilateral dorsal pedal pulse Neuro: Alert and oriented. No facial asymmetry. No focal deficit. Moves all extremities spontaneously. Musculoskeletal: Normal muscle tone with kyphosis Psych:  Responds to questions appropriately with a normal affect.    Assessment: 83 year old male with essential hypertension mixed hyperlipidemia paroxysmal nonvalvular atrial fibrillation with variable rate but no evidence of sick sinus syndrome, symptomatic bradycardia and/or heart block of at last 3 visits to the emergency room with unresponsiveness and/or syncope of unknown etiology at this time with a differential diagnoses of sleep apnea  hypotension and CNS event without evidence of myocardial infarction or apparent primary cardiac event  Plan: 1. Continue hypertension control with losartan although if concerns of hypotension is concerning with above issues would discontinue 2. Continue warfarin for further risk reduction in stroke with atrial fibrillation 3. No rhythm or rate relating medication management due to concerns of bradycardia 4. Continue ambulation following for rhythm disturbances or primary cause of syncopal episode and/or unresponsiveness and would consider a link device for evaluation of rhythm disturbances if there is still question of this being sick sinus syndrome symptomatic bradycardia and/or heart block 5. No further cardiac workup or diagnostic testing at this time  Signed, Corey Skains M.D. [facility] 06/30/2016, 8:08 AM

## 2016-06-30 NOTE — Progress Notes (Signed)
CRITICAL VALUE ALERT  Critical value received: INR  4.26  Date of notification:  06/30/16 Time of notification: 18:55  Critical value read back:yes Nurse who received alert: Lennart Pall RN- MD notified (1st page):  Hospitalist on call  Time of first page:  [phone]  MD notified (2nd page):  Time of second page:  Responding MD:  Dr. Vianne Bulls  Time MD responded: 1901

## 2016-06-30 NOTE — Care Management (Addendum)
Patient is admitted from home and CM informed that there is consideration for hemodialysis. Unit director informs CM that family has many questions  about "how will patient get to dialysis."  Patient lives with his wife.  His son Kyle Vasquez is HCPOA.  Kyle Vasquez does not live with patient but checks on him daily and gets patient dressed.  Patient has a PCS aide Monday through Friday for approximately 4-5 hours a day.  This aide bathes patient "but does not get him dressed."  Kyle Vasquez dresses patient and unable to determine why aide is not doing this.  Patient is total care.  He is nonambulatory.  [facility] provides transportation.  Discussed issues related to long term chronic dialysis for this particular patient. Patient would have to be able to sit for approximately 3-4 hours- it is stated that patient can not sit for long periods of time;  Patient has a wheelchair and family would have to be able to get patient in the wheelchair.  Daughter Kyle Vasquez says "no one can get him up except Kyle Vasquez and he would be at work." Discussed use of a [employer] fast streamer. CM was informed that patient is not able to participate in any care decisions.  Discussed the commitment to long term dialysis and stressed that patient would have to be able to sit up and not lay on a stretcher for treatments. Discussed palliative care consult to assist family in care decisions and treatment goals.  Kyle Vasquez and patient's wife are in agreement with palliative consult.  Requested order from attending.  Nephrology consult is pending

## 2016-06-30 NOTE — Progress Notes (Signed)
[facility] [place] NAME: Kyle Vasquez    MR#:  [id number]  DATE OF BIRTH:  12-28-1931  SUBJECTIVE:  Patient came in after having a syncopal episode at primary care physician's office. He was found a heart rate in the 30s. His heart rate is fluctuating anywhere from 60s to 103. Patient otherwise is asymptomatic. He does not walk at home is wheelchair bound per patient and wife.  REVIEW OF SYSTEMS:   Review of Systems  Constitutional: Negative for chills, fever and weight loss.  HENT: Negative for ear discharge, ear pain and nosebleeds.   Eyes: Negative for blurred vision, pain and discharge.  Respiratory: Negative for sputum production, shortness of breath, wheezing and stridor.   Cardiovascular: Negative for chest pain, palpitations, orthopnea and PND.  Gastrointestinal: Negative for abdominal pain, diarrhea, nausea and vomiting.  Genitourinary: Negative for frequency and urgency.  Musculoskeletal: Negative for back pain and joint pain.  Neurological: Positive for weakness. Negative for sensory change, speech change and focal weakness.  Psychiatric/Behavioral: Negative for depression and hallucinations. The patient is not nervous/anxious.    Tolerating Diet:yes Tolerating PT: pt not ambulatory  DRUG ALLERGIES:  No Known Allergies  VITALS:  Blood pressure (!) 147/99, pulse (!) 103, temperature 97.5 F (36.4 C), temperature source Oral, resp. rate 14, height 6\' 5"  (1.956 m), weight 86.2 kg (190 lb), SpO2 97 %.  PHYSICAL EXAMINATION:   Physical Exam  GENERAL:  83 y.o.-year-old patient lying in the bed with no acute distress.  EYES: Pupils equal, round, reactive to light and accommodation. No scleral icterus. Extraocular muscles intact.  HEENT: Head atraumatic, normocephalic. Oropharynx and nasopharynx clear.  NECK:  Supple, no jugular venous distention. No thyroid enlargement, no tenderness.  LUNGS: Normal breath sounds  bilaterally, no wheezing, rales, rhonchi. No use of accessory muscles of respiration.  CARDIOVASCULAR: S1, S2 normal. No murmurs, rubs, or gallops.  ABDOMEN: Soft, nontender, nondistended. Bowel sounds present. No organomegaly or mass.  EXTREMITIES: No cyanosis, clubbing or edema b/l.    NEUROLOGIC: Cranial nerves II through XII are intact. No focal Motor or sensory deficits b/l.   PSYCHIATRIC:  patient is alert and oriented x 3.  SKIN: No obvious rash, lesion, or ulcer.   LABORATORY PANEL:  CBC  Recent Labs Lab 06/30/16 0617  WBC 3.5*  HGB 8.4*  HCT 25.5*  PLT 173    Chemistries   Recent Labs Lab 06/29/16 1623 06/30/16 0617  NA 142 142  K 4.9 5.3*  CL 111 113*  CO2 25 23  GLUCOSE 105* 74  BUN 64* 61*  CREATININE 3.80* 3.66*  CALCIUM 8.9 8.7*  MG 2.3  --    Cardiac Enzymes  Recent Labs Lab 06/30/16 0617  TROPONINI 0.04*   RADIOLOGY:  Ct Head Wo Contrast  Result Date: 06/29/2016 CLINICAL DATA:  Cardiac arrest today at primary care physician's office. Patient spontaneously regained consciousness. Recent recurrent syncopal episodes. History of atrial fibrillation on Coumadin, hypertension, symptomatic bradycardia. EXAM: CT HEAD WITHOUT CONTRAST TECHNIQUE: Contiguous axial images were obtained from the base of the skull through the vertex without intravenous contrast. COMPARISON:  CT HEAD January 06, 2016 FINDINGS: BRAIN: No intraparenchymal hemorrhage, mass effect nor midline shift. Severe ventriculomegaly with disproportionate sulcal effacement at the convexities. RIGHT temporal occipital lobe encephalomalacia. Small area LEFT occipital lobe encephalomalacia. Small area bifrontal encephalomalacia. Old RIGHT superior cerebellar infarct. Confluent supratentorial and patchy pontine white matter hypodensities. No abnormal extra-axial fluid collections. VASCULAR: Moderate calcific  atherosclerosis of the carotid siphons. SKULL: No skull fracture. No significant scalp soft tissue  swelling. SINUSES/ORBITS: The mastoid air-cells and included paranasal sinuses are well-aerated. Soft tissue within LEFT external auditory canal most compatible with cerumen. The included ocular globes and orbital contents are non-suspicious. OTHER: None. IMPRESSION: 1. No acute intracranial process. 2. Severe ventriculomegaly and suspected normal pressure hydrocephalus. 3. Old bilateral PCA, RIGHT superior cerebellar artery territory infarcts. Old small bifrontal frontal lobe infarcts versus old contusions. 4. Severe chronic small vessel ischemic disease though, there may be a component of interstitial edema. Electronically Signed   By: Elon Alas M.D.   On: 06/29/2016 19:03   US Renal  Result Date: 06/30/2016 CLINICAL DATA:  Acute renal failure. EXAM: RENAL / URINARY TRACT ULTRASOUND COMPLETE COMPARISON:  CT chest, abdomen and pelvis 03/28/2013. FINDINGS: Right Kidney: Length: 7.8 cm. Cortical echogenicity is increased. No hydronephrosis or focal lesion. Left Kidney: Length: 8.1 cm. Cortical echogenicity is increased. No focal lesion for hydronephrosis. Bladder: The posterior bladder wall appears mildly thickened. IMPRESSION: Negative for hydronephrosis. Increased cortical echogenicity bilaterally consistent with medical renal disease. Electronically Signed   By: Inge Rise M.D.   On: 06/30/2016 10:38   US Carotid Bilateral  Result Date: 06/30/2016 CLINICAL DATA:  Syncope. EXAM: BILATERAL CAROTID DUPLEX ULTRASOUND TECHNIQUE: Pearline Cables scale imaging, color Doppler and duplex ultrasound were performed of bilateral carotid and vertebral arteries in the neck. COMPARISON:  CT 06/29/2016 . FINDINGS: Criteria: Quantification of carotid stenosis is based on velocity parameters that correlate the residual internal carotid diameter with NASCET-based stenosis levels, using the diameter of the distal internal carotid lumen as the denominator for stenosis measurement. The following velocity measurements were  obtained: RIGHT ICA:  70/23 cm/sec CCA:  39/76 cm/sec SYSTOLIC ICA/CCA RATIO:  0.8 DIASTOLIC ICA/CCA RATIO:  0.7 ECA:  122 cm/sec LEFT ICA:  69/30 cm/sec CCA:  73/41 cm/sec SYSTOLIC ICA/CCA RATIO:  0.8 DIASTOLIC ICA/CCA RATIO:  1.1 ECA:  90 cm/sec RIGHT CAROTID ARTERY: Diffuse moderate right common carotid, carotid bifurcation, proximal ICA atherosclerotic vascular disease. No flow limiting stenosis. RIGHT VERTEBRAL ARTERY:  Patent with antegrade flow. LEFT CAROTID ARTERY: Diffuse moderate left common carotid, carotid bifurcation, proximal ICA atherosclerotic vascular disease. No flow limiting stenosis . LEFT VERTEBRAL ARTERY:  Patent with antegrade flow. Cardiac arrhythmia cannot be excluded. IMPRESSION: 1. Diffuse moderate bilateral common carotid, carotid bifurcation, proximal ICA sclerotic vascular disease. No flow limiting stenosis. Degree of stenosis less than 50% bilaterally. 2. Vertebrals are patent with antegrade flow. 3. Cardiac arrhythmia cannot be excluded. Electronically Signed   By: Marcello Moores  Register   On: 06/30/2016 10:41   Dg Chest Portable 1 View  Result Date: 06/29/2016 CLINICAL DATA:  Cardiac arrest EXAM: PORTABLE CHEST 1 VIEW COMPARISON:  Chest radiograph 09/07/2015 FINDINGS: The cardiomediastinal silhouette remains enlarged. There is no focal airspace consolidation or pulmonary edema. There is atherosclerotic calcification in the aortic arch. There is linear atelectasis at the left lung base. No pneumothorax or sizable pleural effusion. IMPRESSION: 1. Persistent cardiomegaly and calcific aortic atherosclerosis without overt pulmonary edema. 2. Left lung base atelectasis. Electronically Signed   By: Ulyses Jarred M.D.   On: 06/29/2016 17:29   ASSESSMENT AND PLAN:   Aland Chestnutt  is a 83 y.o. male with a known history of Paroxysmal atrial fibrillation with sick sinus syndrome, hypertension, mitral valve repair, history of stroke and on Coumadin, lumbar myelopathy with bilateral lower  extremity weakness, hypertension, COPD not on home oxygen presents to the hospital secondary to  a syncopal episode.  #1 syncopal episode-secondary to arrhythmias and bradycardia. - Cardiology consult noted. NO plans for PM for now  #2 paroxysmal atrial fibrillation-with bradycardia and sick sinus syndrome.  -Cardiology has been consulted. - on coumadin for anticoagulation, bradycardic now- recurrent syncope - evaluate for a pacemaker -Not on any rate limiting medications  #3 acute renal failure and CKD-Baseline creatinine gradually worsening and seems to be around 2.9 with GFR of 22. -Creatinine from last week was still worse at 3.4, now with 3.8.---IVF 3.6 -Hold Lasix.  Continue losartan at this time. Hold if further worsening is noted. - nephrology consulted. -Gentle hydration is ordered  #4 hypertension-continue losartan. Hold Lasix at this time due to renal failure  #5 bilateral lower extremity weakness-secondary to lumbar myelopathy. Bed bound at baseline.  #6 DVT prophylaxis-already on Coumadin  #7 Acute cystitis- from last week, continue keflex- renally dosed? For 3 more days  Case discussed with Care Management/Social Worker. Management plans discussed with the patient, family and they are in agreement.  CODE STATUS: full  DVT Prophylaxis: coumadin  TOTAL TIME TAKING CARE OF THIS PATIENT: 30 minutes.  >50% time spent on counselling and coordination of care  POSSIBLE D/C IN 1-2 DAYS, DEPENDING ON CLINICAL CONDITION.  Note: This dictation was prepared with Dragon dictation along with smaller phrase technology. Any transcriptional errors that result from this process are unintentional.  Neima Lacross M.D on 06/30/2016 at 2:35 PM  Between 7am to 6pm - Pager - [phone]  After 6pm go to [email or web address] - password [email or web address] [facility]  Office  [phone]  CC: Primary care physician; Tracie Harrier, MD

## 2016-06-30 NOTE — Progress Notes (Signed)
ANTICOAGULATION CONSULT NOTE - Initial Consult  Pharmacy Consult for warfarin Indication: atrial fibrillation  No Known Allergies  Patient Measurements: Height: 6\' 5"  (195.6 cm) Weight: 190 lb (86.2 kg) IBW/kg (Calculated) : 89.1   Vital Signs: Temp: 98.1 F (36.7 C) (04/05 0736) Temp Source: Oral (04/05 0736) BP: 151/78 (04/05 0736) Pulse Rate: 67 (04/05 0736)  Labs:  Recent Labs  06/29/16 1623 06/29/16 1912 06/30/16 0041 06/30/16 0617  HGB 9.0*  --   --  8.4*  HCT 27.5*  --   --  25.5*  PLT 181  --   --  173  LABPROT 40.7*  --   --   --   INR 4.09*  --   --   --   CREATININE 3.80*  --   --  3.66*  TROPONINI 0.04* 0.03* 0.04* 0.04*    Estimated Creatinine Clearance: 18.6 mL/min (A) (by C-G formula based on SCr of 3.66 mg/dL (H)).   Medical History: Past Medical History:  Diagnosis Date  . Atrial fibrillation ([place])   . Bradycardia   . Hypertension   . Hypothyroidism   . Lumbar myelopathy ([place])   . MDS (myelodysplastic syndrome) ([place])   . OSA (obstructive sleep apnea)   . Stroke [facility])    left sided weakness  . Syncope     Medications:  Infusions:  . sodium chloride 50 mL/hr at 06/29/16 2027    Assessment: 85 yom cc cardiac arrest with PMH AF on VKA, HTN, stroke, symptomatic bradycardia. Was at PCP office for ED visit f/u, became unresponsive according to son, staff found no pulse. Patient regained pulse prior to any CPR or defib. Pharmacy consulted to continue VKA dosing for AF.   Goal of Therapy:  INR 2-3 Monitor platelets by anticoagulation protocol: Yes   Plan:  INR is supratherapeutic on admission. Hold dose for now. Will follow INR and resume dosing when INR <3.   Napoleon Form, PharmD Clinical Pharmacist 06/30/2016,9:11 AM

## 2016-06-30 NOTE — Progress Notes (Signed)
O2 sats on R/A = 84%.  Restarted on 2L [place] sats increased to 97%.  Will recheck them in a few hours.  The increase in impressive for only 2 L's.

## 2016-07-01 LAB — PROTIME-INR
INR: 4.28
Prothrombin Time: 42.2 seconds — ABNORMAL HIGH (ref 11.4–15.2)

## 2016-07-01 LAB — PARATHYROID HORMONE, INTACT (NO CA): PTH: 102 pg/mL — ABNORMAL HIGH (ref 15–65)

## 2016-07-01 LAB — BASIC METABOLIC PANEL
Anion gap: 5 (ref 5–15)
BUN: 58 mg/dL — ABNORMAL HIGH (ref 6–20)
CALCIUM: 8.4 mg/dL — AB (ref 8.9–10.3)
CO2: 23 mmol/L (ref 22–32)
Chloride: 113 mmol/L — ABNORMAL HIGH (ref 101–111)
Creatinine, Ser: 3.58 mg/dL — ABNORMAL HIGH (ref 0.61–1.24)
GFR calc Af Amer: 17 mL/min — ABNORMAL LOW (ref >60)
GFR calc non Af Amer: 14 mL/min — ABNORMAL LOW (ref >60)
GLUCOSE: 79 mg/dL (ref 65–99)
POTASSIUM: 5.2 mmol/L — AB (ref 3.5–5.1)
Sodium: 141 mmol/L (ref 135–145)

## 2016-07-01 LAB — PROTEIN ELECTROPHORESIS, SERUM
A/G Ratio: 0.9 (ref 0.7–1.7)
ALPHA-2-GLOBULIN: 0.7 g/dL (ref 0.4–1.0)
Albumin ELP: 3.1 g/dL (ref 2.9–4.4)
Alpha-1-Globulin: 0.2 g/dL (ref 0.0–0.4)
BETA GLOBULIN: 1 g/dL (ref 0.7–1.3)
GAMMA GLOBULIN: 1.5 g/dL (ref 0.4–1.8)
Globulin, Total: 3.5 g/dL (ref 2.2–3.9)
Total Protein ELP: 6.6 g/dL (ref 6.0–8.5)

## 2016-07-01 LAB — MPO/PR-3 (ANCA) ANTIBODIES
ANCA PROTEINASE 3: 7.6 U/mL — AB (ref 0.0–3.5)
Myeloperoxidase Abs: <9 U/mL (ref 0.0–9.0)

## 2016-07-01 LAB — GLOMERULAR BASEMENT MEMBRANE ANTIBODIES: GBM AB: 14 U (ref 0–20)

## 2016-07-01 LAB — ANA W/REFLEX IF POSITIVE: ANA: NEGATIVE

## 2016-07-01 LAB — C3 COMPLEMENT: C3 COMPLEMENT: 127 mg/dL (ref 82–167)

## 2016-07-01 MED ORDER — AMLODIPINE BESYLATE 5 MG PO TABS
5.0000 mg | ORAL_TABLET | Freq: Every day | ORAL | Status: DC
  Administered 2016-07-01: 5 mg via ORAL
  Filled 2016-07-01: qty 1

## 2016-07-01 MED ORDER — AMLODIPINE BESYLATE 5 MG PO TABS: 5.0000 mg | ORAL_TABLET | Freq: Every day | ORAL | 0 refills | Status: AC

## 2016-07-01 NOTE — Progress Notes (Signed)
[facility]  ROUNDING NOTE   Subjective:  Renal function relatively stable at the moment. Creatinine currently 3.58. Some periods of bradycardia still noted. This may be the patient's new baseline creatinine. We talked in depth about renal replacement therapy in the future.   Objective:  Vital signs in last 24 hours:  Temp:  [97.5 F (36.4 C)-98.5 F (36.9 C)] 97.7 F (36.5 C) (04/06 0842) Pulse Rate:  [58-103] 59 (04/06 0842) Resp:  [14-16] 16 (04/06 0842) BP: (147-163)/(71-100) 163/100 (04/06 0842) SpO2:  [84 %-100 %] 100 % (04/06 0842)  Weight change:  Filed Weights   06/29/16 1622  Weight: 86.2 kg (190 lb)    Intake/Output: I/O last 3 completed shifts: In: 363 [P.O.:360; I.V.:3] Out: 202 [Urine:202]   Intake/Output this shift:  Total I/O In: 3 [I.V.:3] Out: -   Physical Exam: General: No acute distress  Head: Normocephalic, atraumatic. Moist oral mucosal membranes  Eyes: Anicteric  Neck: Supple, trachea midline  Lungs:  Clear to auscultation, normal effort  Heart: S1S2 no rubs  Abdomen:  Soft, nontender  Extremities: No peripheral edema.  Neurologic: Awake, alert, left sided weakness  Skin: No lesions       Basic Metabolic Panel:  Recent Labs Lab 06/29/16 1623 06/30/16 0617 06/30/16 1756 07/01/16 0322  NA 142 142  --  141  K 4.9 5.3*  --  5.2*  CL 111 113*  --  113*  CO2 25 23  --  23  GLUCOSE 105* 74  --  79  BUN 64* 61*  --  58*  CREATININE 3.80* 3.66*  --  3.58*  CALCIUM 8.9 8.7*  --  8.4*  MG 2.3  --   --   --   PHOS  --   --  3.1  --     Liver Function Tests: No results for input(s): AST, ALT, ALKPHOS, BILITOT, PROT, ALBUMIN in the last 168 hours. No results for input(s): LIPASE, AMYLASE in the last 168 hours. No results for input(s): AMMONIA in the last 168 hours.  CBC:  Recent Labs Lab 06/29/16 1623 06/30/16 0617  WBC 3.8 3.5*  HGB 9.0* 8.4*  HCT 27.5* 25.5*  MCV 92.2 89.8  PLT 181 173    Cardiac  Enzymes:  Recent Labs Lab 06/29/16 1623 06/29/16 1912 06/30/16 0041 06/30/16 0617  TROPONINI 0.04* 0.03* 0.04* 0.04*    BNP: Invalid input(s): POCBNP  CBG: No results for input(s): GLUCAP in the last 168 hours.  Microbiology: Results for orders placed or performed during the hospital encounter of 06/23/16  Urine culture     Status: Abnormal   Collection Time: 06/23/16  3:10 PM  Result Value Ref Range Status   Specimen Description URINE, CLEAN CATCH  Final   Special Requests NONE  Final   Culture >=100,000 COLONIES/mL ESCHERICHIA COLI (A)  Final   Report Status 06/26/2016 FINAL  Final   Organism ID, Bacteria ESCHERICHIA COLI (A)  Final      Susceptibility   Escherichia coli - MIC*    AMPICILLIN >=32 RESISTANT Resistant     CEFAZOLIN <=4 SENSITIVE Sensitive     CEFTRIAXONE <=1 SENSITIVE Sensitive     CIPROFLOXACIN 0.5 SENSITIVE Sensitive     GENTAMICIN <=1 SENSITIVE Sensitive     IMIPENEM <=0.25 SENSITIVE Sensitive     NITROFURANTOIN <=16 SENSITIVE Sensitive     TRIMETH/SULFA <=20 SENSITIVE Sensitive     AMPICILLIN/SULBACTAM >=32 RESISTANT Resistant     PIP/TAZO <=4 SENSITIVE Sensitive  Extended ESBL NEGATIVE Sensitive     * >=100,000 COLONIES/mL ESCHERICHIA COLI    Coagulation Studies:  Recent Labs  06/29/16 1623 06/30/16 1756 07/01/16 0322  LABPROT 40.7* 42.1* 42.2*  INR 4.09* 4.26* 4.28*    Urinalysis: No results for input(s): COLORURINE, LABSPEC, PHURINE, GLUCOSEU, HGBUR, BILIRUBINUR, KETONESUR, PROTEINUR, UROBILINOGEN, NITRITE, LEUKOCYTESUR in the last 72 hours.  Invalid input(s): APPERANCEUR    Imaging: Ct Head Wo Contrast  Result Date: 06/29/2016 CLINICAL DATA:  Cardiac arrest today at primary care physician's office. Patient spontaneously regained consciousness. Recent recurrent syncopal episodes. History of atrial fibrillation on Coumadin, hypertension, symptomatic bradycardia. EXAM: CT HEAD WITHOUT CONTRAST TECHNIQUE: Contiguous axial images  were obtained from the base of the skull through the vertex without intravenous contrast. COMPARISON:  CT HEAD January 06, 2016 FINDINGS: BRAIN: No intraparenchymal hemorrhage, mass effect nor midline shift. Severe ventriculomegaly with disproportionate sulcal effacement at the convexities. RIGHT temporal occipital lobe encephalomalacia. Small area LEFT occipital lobe encephalomalacia. Small area bifrontal encephalomalacia. Old RIGHT superior cerebellar infarct. Confluent supratentorial and patchy pontine white matter hypodensities. No abnormal extra-axial fluid collections. VASCULAR: Moderate calcific atherosclerosis of the carotid siphons. SKULL: No skull fracture. No significant scalp soft tissue swelling. SINUSES/ORBITS: The mastoid air-cells and included paranasal sinuses are well-aerated. Soft tissue within LEFT external auditory canal most compatible with cerumen. The included ocular globes and orbital contents are non-suspicious. OTHER: None. IMPRESSION: 1. No acute intracranial process. 2. Severe ventriculomegaly and suspected normal pressure hydrocephalus. 3. Old bilateral PCA, RIGHT superior cerebellar artery territory infarcts. Old small bifrontal frontal lobe infarcts versus old contusions. 4. Severe chronic small vessel ischemic disease though, there may be a component of interstitial edema. Electronically Signed   By: Elon Alas M.D.   On: 06/29/2016 19:03   US Renal  Result Date: 06/30/2016 CLINICAL DATA:  Acute renal failure. EXAM: RENAL / URINARY TRACT ULTRASOUND COMPLETE COMPARISON:  CT chest, abdomen and pelvis 03/28/2013. FINDINGS: Right Kidney: Length: 7.8 cm. Cortical echogenicity is increased. No hydronephrosis or focal lesion. Left Kidney: Length: 8.1 cm. Cortical echogenicity is increased. No focal lesion for hydronephrosis. Bladder: The posterior bladder wall appears mildly thickened. IMPRESSION: Negative for hydronephrosis. Increased cortical echogenicity bilaterally consistent  with medical renal disease. Electronically Signed   By: Inge Rise M.D.   On: 06/30/2016 10:38   US Carotid Bilateral  Result Date: 06/30/2016 CLINICAL DATA:  Syncope. EXAM: BILATERAL CAROTID DUPLEX ULTRASOUND TECHNIQUE: Pearline Cables scale imaging, color Doppler and duplex ultrasound were performed of bilateral carotid and vertebral arteries in the neck. COMPARISON:  CT 06/29/2016 . FINDINGS: Criteria: Quantification of carotid stenosis is based on velocity parameters that correlate the residual internal carotid diameter with NASCET-based stenosis levels, using the diameter of the distal internal carotid lumen as the denominator for stenosis measurement. The following velocity measurements were obtained: RIGHT ICA:  70/23 cm/sec CCA:  67/34 cm/sec SYSTOLIC ICA/CCA RATIO:  0.8 DIASTOLIC ICA/CCA RATIO:  0.7 ECA:  122 cm/sec LEFT ICA:  69/30 cm/sec CCA:  19/37 cm/sec SYSTOLIC ICA/CCA RATIO:  0.8 DIASTOLIC ICA/CCA RATIO:  1.1 ECA:  90 cm/sec RIGHT CAROTID ARTERY: Diffuse moderate right common carotid, carotid bifurcation, proximal ICA atherosclerotic vascular disease. No flow limiting stenosis. RIGHT VERTEBRAL ARTERY:  Patent with antegrade flow. LEFT CAROTID ARTERY: Diffuse moderate left common carotid, carotid bifurcation, proximal ICA atherosclerotic vascular disease. No flow limiting stenosis . LEFT VERTEBRAL ARTERY:  Patent with antegrade flow. Cardiac arrhythmia cannot be excluded. IMPRESSION: 1. Diffuse moderate bilateral common carotid, carotid bifurcation, proximal ICA sclerotic  vascular disease. No flow limiting stenosis. Degree of stenosis less than 50% bilaterally. 2. Vertebrals are patent with antegrade flow. 3. Cardiac arrhythmia cannot be excluded. Electronically Signed   By: Marcello Moores  Register   On: 06/30/2016 10:41   Dg Chest Portable 1 View  Result Date: 06/29/2016 CLINICAL DATA:  Cardiac arrest EXAM: PORTABLE CHEST 1 VIEW COMPARISON:  Chest radiograph 09/07/2015 FINDINGS: The cardiomediastinal  silhouette remains enlarged. There is no focal airspace consolidation or pulmonary edema. There is atherosclerotic calcification in the aortic arch. There is linear atelectasis at the left lung base. No pneumothorax or sizable pleural effusion. IMPRESSION: 1. Persistent cardiomegaly and calcific aortic atherosclerosis without overt pulmonary edema. 2. Left lung base atelectasis. Electronically Signed   By: Ulyses Jarred M.D.   On: 06/29/2016 17:29     Medications:    . amLODipine  5 mg Oral Daily  . cephALEXin  250 mg Oral BID  . losartan  25 mg Oral Daily  . sodium chloride flush  3 mL Intravenous Q12H  . vitamin B-12  500 mcg Oral BID   acetaminophen **OR** acetaminophen, ondansetron **OR** ondansetron (ZOFRAN) IV  Assessment/ Plan:  83 y.o. African American male with a PMHx of Atrial fibrillation, bradycardia, hypertension, hypothyroidism, myelodysplastic syndrome, obstructive sleep apnea, stroke with left-sided weakness, history of syncope, chronic kidney disease stage IV baseline EGFR 22, who was admitted to [facility] on 06/29/2016 for evaluation of syncope vs unresponsive episode.  1.  Acute renal failure/CKD stage IV baseline Cr 2.9/proteinuria. Renal ultrasound reveals bilateral atrophic kidneys with increased echogenicity. -  Suspect that the patient may have experienced progressive renal dysfunction. Creatinine has not changed very much over the course of the hospitalization. We will need to continue to monitor his renal function closely as an outpatient. Okay to continue losartan for now.  2.  Anemia of CKD. Hemoglobin currently 8.4. Consider Epogen as an outpatient.  3.  Hyperkalemia.   potassium currently 5.2. We may need to consider patiromer as an outpt.  4.  Syncope vs unresponsive episode. As per cardiology.  PPM noted needed at the moment.    LOS: 2 Addi Pak 4/6/201811:15 AM

## 2016-07-01 NOTE — Clinical Social Work Note (Signed)
CSW received referral for SNF.  Case discussed with case manager and plan is to discharge home with home health.  CSW to sign off please re-consult if social work needs arise.  Jaysean Manville R. Praneel Haisley, MSW, LCSWA [phone]  

## 2016-07-01 NOTE — Progress Notes (Signed)
MD made aware of new PT and INR results. No new orders received.

## 2016-07-01 NOTE — Discharge Instructions (Signed)
° °  Chronic Kidney Disease, Adult Chronic kidney disease (CKD) happens when the kidneys are damaged during a time of 3 or more months. The kidneys are two organs that do many important jobs in the body. These jobs include:  Removing wastes and extra fluids from the blood.  Making hormones that maintain the amount of fluid in your tissues and blood vessels.  Making sure that the body has the right amount of fluids and chemicals. Most of the time, this condition does not go away, but it can usually be controlled. Steps must be taken to slow down the kidney damage or stop it from getting worse. Otherwise, the kidneys may stop working. Follow these instructions at home:  Follow your diet as told by your doctor. You may need to avoid alcohol, salty foods (sodium), and foods that are high in potassium, calcium, and protein.  Take over-the-counter and prescription medicines only as told by your doctor. Do not take any new medicines unless your doctor says you can do that. These include vitamins and minerals.  Medicines and nutritional supplements can make kidney damage worse.  Your doctor may need to change how much medicine you take.  Do not use any tobacco products. These include cigarettes, chewing tobacco, and e-cigarettes. If you need help quitting, ask your doctor.  Keep all follow-up visits as told by your doctor. This is important.  Check your blood pressure. Tell your doctor if there are changes to your blood pressure.  Get to a healthy weight. Stay at that weight. If you need help with this, ask your doctor.  Start or continue an exercise plan. Try to exercise at least 30 minutes a day, 5 days a week.  Stay up-to-date with your shots (immunizations) as told by your doctor. Contact a doctor if:  Your symptoms get worse.  You have new symptoms. Get help right away if:  You have symptoms of end-stage kidney disease. These include:  Headaches.  Skin that is darker or lighter  than normal.  Numbness in your hands or feet.  Easy bruising.  Having hiccups often.  Chest pain.  Shortness of breath.  Stopping of menstrual periods in women.  You have a fever.  You are making very little pee (urine).  You have pain or bleeding when you pee (urinate). This information is not intended to replace advice given to you by your health care provider. Make sure you discuss any questions you have with your health care provider. Document Released: 06/08/2009 Document Revised: 08/20/2015 Document Reviewed: 11/11/2011 Elsevier Interactive Patient Education  2017 [employer]

## 2016-07-01 NOTE — Discharge Summary (Signed)
Kyle Vasquez NAME: Kyle Vasquez    MR#:  [id number]  DATE OF BIRTH:  1931/07/14  DATE OF ADMISSION:  06/29/2016 ADMITTING PHYSICIAN: Gladstone Lighter, MD  DATE OF DISCHARGE: 07/01/16  PRIMARY CARE PHYSICIAN: Tracie Harrier, MD    ADMISSION DIAGNOSIS:  Syncope and collapse [R55] Syncope [R55] ARF (acute renal failure) (HCC) [N17.9] Acute renal failure superimposed on chronic kidney disease, unspecified CKD stage, unspecified acute renal failure type ([place]) [N17.9, N18.9]  DISCHARGE DIAGNOSIS:  Syncope Chronic afib CKD-IV  HTN  SECONDARY DIAGNOSIS:   Past Medical History:  Diagnosis Date  . Atrial fibrillation ([place])   . Bradycardia   . Hypertension   . Hypothyroidism   . Lumbar myelopathy ([place])   . MDS (myelodysplastic syndrome) ([place])   . OSA (obstructive sleep apnea)   . Stroke [facility])    left sided weakness  . Syncope     HOSPITAL COURSE:  Kyle Vasquez a 83 y.o. malewith a known history of Paroxysmal atrial fibrillation with sick sinus syndrome, hypertension, mitral valve repair, history of stroke and on Coumadin, lumbar myelopathy with bilateral lower extremity weakness, hypertension, COPD not on home oxygen presents to the hospital secondary to a syncopal episode.  #1 syncopal episode-secondary to arrhythmias and bradycardia. - Cardiology consult noted. NO plans for PM for now  #2 paroxysmal atrial fibrillation-with intermittent bradycardia   -Cardiology has been consulted. - on coumadin for anticoagulation - evaluate for a pacemaker -Not on any ratelimiting medications  #3 acute renal failure and CKD-Baseline creatinine gradually worsening and seems to be around 2.9 with GFR of 22. -Creatinine from last week was still worse at 3.4, now with 3.8.---IVF 3.6--3.5 -d/ced Lasix.  - Continue losartan at this time.  - nephrology consulted --pt is nearing for dialysis. Family aware. Close f/u as  outpt -received Gentle hydration   #4 hypertension-continue losartan.  -add amlodipin  #5 bilateral lower extremity weakness-secondary to lumbar myelopathy. Bed bound at baseline.  #6 DVT prophylaxis-already on Coumadin  #7 Acute cystitis- completed keflex  HHRN to f/u for vitals given syncope and have PT/INR drawn on next Tuesday  D/w dter int he room and son [employer] on the phone  CONSULTS OBTAINED:  Treatment Team:  Corey Skains, MD Munsoor Lateef, MD  DRUG ALLERGIES:  No Known Allergies  DISCHARGE MEDICATIONS:   Current Discharge Medication List    START taking these medications   Details  amLODipine (NORVASC) 5 MG tablet Take 1 tablet (5 mg total) by mouth daily. Qty: 30 tablet, Refills: 0      CONTINUE these medications which have NOT CHANGED   Details  furosemide (LASIX) 20 MG tablet Take 10 mg by mouth 2 (two) times daily.     losartan (COZAAR) 25 MG tablet Take 25 mg by mouth daily.    vitamin B-12 (CYANOCOBALAMIN) 1000 MCG tablet Take 500 mcg by mouth 2 (two) times daily.       STOP taking these medications     cephALEXin (KEFLEX) 500 MG capsule      warfarin (COUMADIN) 4 MG tablet         If you experience worsening of your admission symptoms, develop shortness of breath, life threatening emergency, suicidal or homicidal thoughts you must seek medical attention immediately by calling 911 or calling your MD immediately  if symptoms less severe.  You Must read complete instructions/literature along with all the possible adverse reactions/side effects for all the Medicines you take  and that have been prescribed to you. Take any new Medicines after you have completely understood and accept all the possible adverse reactions/side effects.   Please note  You were cared for by a hospitalist during your hospital stay. If you have any questions about your discharge medications or the care you received while you were in the hospital after you  are discharged, you can call the unit and asked to speak with the hospitalist on call if the hospitalist that took care of you is not available. Once you are discharged, your primary care physician will handle any further medical issues. Please note that NO REFILLS for any discharge medications will be authorized once you are discharged, as it is imperative that you return to your primary care physician (or establish a relationship with a primary care physician if you do not have one) for your aftercare needs so that they can reassess your need for medications and monitor your lab values. Today   SUBJECTIVE   Doing ok  VITAL SIGNS:  Blood pressure (!) 163/100, pulse (!) 59, temperature 97.7 F (36.5 C), temperature source Oral, resp. rate 16, height 6\' 5"  (1.956 m), weight 86.2 kg (190 lb), SpO2 100 %.  I/O:   Intake/Output Summary (Last 24 hours) at 07/01/16 1010 Last data filed at 07/01/16 0857  Gross per 24 hour  Intake              363 ml  Output              201 ml  Net              162 ml    PHYSICAL EXAMINATION:  GENERAL:  83 y.o.-year-old patient lying in the bed with no acute distress.  EYES: Pupils equal, round, reactive to light and accommodation. No scleral icterus. Extraocular muscles intact.  HEENT: Head atraumatic, normocephalic. Oropharynx and nasopharynx clear.  NECK:  Supple, no jugular venous distention. No thyroid enlargement, no tenderness.  LUNGS: Normal breath sounds bilaterally, no wheezing, rales,rhonchi or crepitation. No use of accessory muscles of respiration.  CARDIOVASCULAR: S1, S2 normal. No murmurs, rubs, or gallops.  ABDOMEN: Soft, non-tender, non-distended. Bowel sounds present. No organomegaly or mass.  EXTREMITIES: + pedal edema, no cyanosis, or clubbing.  NEUROLOGIC: Cranial nerves II through XII are intact. Muscle strength 5/5 in all extremities. Sensation intact. Gait not checked.  PSYCHIATRIC: The patient is alert and oriented x 3.  SKIN: No  obvious rash, lesion, or ulcer.   DATA REVIEW:   CBC   Recent Labs Lab 06/30/16 0617  WBC 3.5*  HGB 8.4*  HCT 25.5*  PLT 173    Chemistries   Recent Labs Lab 06/29/16 1623  07/01/16 0322  NA 142  < > 141  K 4.9  < > 5.2*  CL 111  < > 113*  CO2 25  < > 23  GLUCOSE 105*  < > 79  BUN 64*  < > 58*  CREATININE 3.80*  < > 3.58*  CALCIUM 8.9  < > 8.4*  MG 2.3  --   --   < > = values in this interval not displayed.  Microbiology Results   Recent Results (from the past 240 hour(s))  Urine culture     Status: Abnormal   Collection Time: 06/23/16  3:10 PM  Result Value Ref Range Status   Specimen Description URINE, CLEAN CATCH  Final   Special Requests NONE  Final   Culture >=100,000 COLONIES/mL ESCHERICHIA COLI (  A)  Final   Report Status 06/26/2016 FINAL  Final   Organism ID, Bacteria ESCHERICHIA COLI (A)  Final      Susceptibility   Escherichia coli - MIC*    AMPICILLIN >=32 RESISTANT Resistant     CEFAZOLIN <=4 SENSITIVE Sensitive     CEFTRIAXONE <=1 SENSITIVE Sensitive     CIPROFLOXACIN 0.5 SENSITIVE Sensitive     GENTAMICIN <=1 SENSITIVE Sensitive     IMIPENEM <=0.25 SENSITIVE Sensitive     NITROFURANTOIN <=16 SENSITIVE Sensitive     TRIMETH/SULFA <=20 SENSITIVE Sensitive     AMPICILLIN/SULBACTAM >=32 RESISTANT Resistant     PIP/TAZO <=4 SENSITIVE Sensitive     Extended ESBL NEGATIVE Sensitive     * >=100,000 COLONIES/mL ESCHERICHIA COLI    RADIOLOGY:  Ct Head Wo Contrast  Result Date: 06/29/2016 CLINICAL DATA:  Cardiac arrest today at primary care physician's office. Patient spontaneously regained consciousness. Recent recurrent syncopal episodes. History of atrial fibrillation on Coumadin, hypertension, symptomatic bradycardia. EXAM: CT HEAD WITHOUT CONTRAST TECHNIQUE: Contiguous axial images were obtained from the base of the skull through the vertex without intravenous contrast. COMPARISON:  CT HEAD January 06, 2016 FINDINGS: BRAIN: No intraparenchymal  hemorrhage, mass effect nor midline shift. Severe ventriculomegaly with disproportionate sulcal effacement at the convexities. RIGHT temporal occipital lobe encephalomalacia. Small area LEFT occipital lobe encephalomalacia. Small area bifrontal encephalomalacia. Old RIGHT superior cerebellar infarct. Confluent supratentorial and patchy pontine white matter hypodensities. No abnormal extra-axial fluid collections. VASCULAR: Moderate calcific atherosclerosis of the carotid siphons. SKULL: No skull fracture. No significant scalp soft tissue swelling. SINUSES/ORBITS: The mastoid air-cells and included paranasal sinuses are well-aerated. Soft tissue within LEFT external auditory canal most compatible with cerumen. The included ocular globes and orbital contents are non-suspicious. OTHER: None. IMPRESSION: 1. No acute intracranial process. 2. Severe ventriculomegaly and suspected normal pressure hydrocephalus. 3. Old bilateral PCA, RIGHT superior cerebellar artery territory infarcts. Old small bifrontal frontal lobe infarcts versus old contusions. 4. Severe chronic small vessel ischemic disease though, there may be a component of interstitial edema. Electronically Signed   By: Elon Alas M.D.   On: 06/29/2016 19:03   US Renal  Result Date: 06/30/2016 CLINICAL DATA:  Acute renal failure. EXAM: RENAL / URINARY TRACT ULTRASOUND COMPLETE COMPARISON:  CT chest, abdomen and pelvis 03/28/2013. FINDINGS: Right Kidney: Length: 7.8 cm. Cortical echogenicity is increased. No hydronephrosis or focal lesion. Left Kidney: Length: 8.1 cm. Cortical echogenicity is increased. No focal lesion for hydronephrosis. Bladder: The posterior bladder wall appears mildly thickened. IMPRESSION: Negative for hydronephrosis. Increased cortical echogenicity bilaterally consistent with medical renal disease. Electronically Signed   By: Inge Rise M.D.   On: 06/30/2016 10:38   US Carotid Bilateral  Result Date: 06/30/2016 CLINICAL  DATA:  Syncope. EXAM: BILATERAL CAROTID DUPLEX ULTRASOUND TECHNIQUE: Pearline Cables scale imaging, color Doppler and duplex ultrasound were performed of bilateral carotid and vertebral arteries in the neck. COMPARISON:  CT 06/29/2016 . FINDINGS: Criteria: Quantification of carotid stenosis is based on velocity parameters that correlate the residual internal carotid diameter with NASCET-based stenosis levels, using the diameter of the distal internal carotid lumen as the denominator for stenosis measurement. The following velocity measurements were obtained: RIGHT ICA:  70/23 cm/sec CCA:  78/29 cm/sec SYSTOLIC ICA/CCA RATIO:  0.8 DIASTOLIC ICA/CCA RATIO:  0.7 ECA:  122 cm/sec LEFT ICA:  69/30 cm/sec CCA:  56/21 cm/sec SYSTOLIC ICA/CCA RATIO:  0.8 DIASTOLIC ICA/CCA RATIO:  1.1 ECA:  90 cm/sec RIGHT CAROTID ARTERY: Diffuse moderate right common carotid,  carotid bifurcation, proximal ICA atherosclerotic vascular disease. No flow limiting stenosis. RIGHT VERTEBRAL ARTERY:  Patent with antegrade flow. LEFT CAROTID ARTERY: Diffuse moderate left common carotid, carotid bifurcation, proximal ICA atherosclerotic vascular disease. No flow limiting stenosis . LEFT VERTEBRAL ARTERY:  Patent with antegrade flow. Cardiac arrhythmia cannot be excluded. IMPRESSION: 1. Diffuse moderate bilateral common carotid, carotid bifurcation, proximal ICA sclerotic vascular disease. No flow limiting stenosis. Degree of stenosis less than 50% bilaterally. 2. Vertebrals are patent with antegrade flow. 3. Cardiac arrhythmia cannot be excluded. Electronically Signed   By: Marcello Moores  Register   On: 06/30/2016 10:41   Dg Chest Portable 1 View  Result Date: 06/29/2016 CLINICAL DATA:  Cardiac arrest EXAM: PORTABLE CHEST 1 VIEW COMPARISON:  Chest radiograph 09/07/2015 FINDINGS: The cardiomediastinal silhouette remains enlarged. There is no focal airspace consolidation or pulmonary edema. There is atherosclerotic calcification in the aortic arch. There is linear  atelectasis at the left lung base. No pneumothorax or sizable pleural effusion. IMPRESSION: 1. Persistent cardiomegaly and calcific aortic atherosclerosis without overt pulmonary edema. 2. Left lung base atelectasis. Electronically Signed   By: Ulyses Jarred M.D.   On: 06/29/2016 17:29     Management plans discussed with the patient, family and they are in agreement.  CODE STATUS:     Code Status Orders        Start     Ordered   06/29/16 1938  Full code  Continuous     06/29/16 1937    Code Status History    Date Active Date Inactive Code Status Order ID Comments User Context   09/07/2015  9:31 PM 09/08/2015  7:58 PM Full Code [id number]  Quintella Baton, MD Inpatient      TOTAL TIME TAKING CARE OF THIS PATIENT: 40 minutes.    Mattisyn Cardona M.D on 07/01/2016 at 10:10 AM  Between 7am to 6pm - Pager - [phone] After 6pm go to [email or web address] - password [email or web address] [place] [facility]  Office  [phone]  CC: Primary care physician; Tracie Harrier, MD

## 2016-07-01 NOTE — Care Management (Signed)
patient for discharge home.  Granddaughter Kyle Vasquez is present with patient's wife.  Agreeable to home health nurse and no agency preference.  Referral to [facility].  Agency is in network with patient's insurance. Will have home health nurse to also draw protimes while providing service.  Granddaughter is firm in statement that there will not be a problem for family to transport patient home and getting him into the house.

## 2016-07-01 NOTE — Plan of Care (Signed)
Problem: Health Behavior/Discharge Planning: Goal: Ability to manage health-related needs will improve Outcome: Progressing Home health nurse monitors BP and sets up medications.

## 2016-07-02 DIAGNOSIS — I129 Hypertensive chronic kidney disease with stage 1 through stage 4 chronic kidney disease, or unspecified chronic kidney disease: Secondary | ICD-10-CM | POA: Diagnosis not present

## 2016-07-02 DIAGNOSIS — R001 Bradycardia, unspecified: Secondary | ICD-10-CM | POA: Diagnosis not present

## 2016-07-02 DIAGNOSIS — N179 Acute kidney failure, unspecified: Secondary | ICD-10-CM | POA: Diagnosis not present

## 2016-07-02 DIAGNOSIS — R55 Syncope and collapse: Secondary | ICD-10-CM | POA: Diagnosis not present

## 2016-07-02 DIAGNOSIS — I495 Sick sinus syndrome: Secondary | ICD-10-CM | POA: Diagnosis not present

## 2016-07-02 DIAGNOSIS — I480 Paroxysmal atrial fibrillation: Secondary | ICD-10-CM | POA: Diagnosis not present

## 2016-07-04 DIAGNOSIS — I10 Essential (primary) hypertension: Secondary | ICD-10-CM | POA: Diagnosis not present

## 2016-07-05 DIAGNOSIS — I129 Hypertensive chronic kidney disease with stage 1 through stage 4 chronic kidney disease, or unspecified chronic kidney disease: Secondary | ICD-10-CM | POA: Diagnosis not present

## 2016-07-05 DIAGNOSIS — I495 Sick sinus syndrome: Secondary | ICD-10-CM | POA: Diagnosis not present

## 2016-07-05 DIAGNOSIS — I10 Essential (primary) hypertension: Secondary | ICD-10-CM | POA: Diagnosis not present

## 2016-07-05 DIAGNOSIS — I480 Paroxysmal atrial fibrillation: Secondary | ICD-10-CM | POA: Diagnosis not present

## 2016-07-05 DIAGNOSIS — R55 Syncope and collapse: Secondary | ICD-10-CM | POA: Diagnosis not present

## 2016-07-05 DIAGNOSIS — N179 Acute kidney failure, unspecified: Secondary | ICD-10-CM | POA: Diagnosis not present

## 2016-07-05 DIAGNOSIS — R001 Bradycardia, unspecified: Secondary | ICD-10-CM | POA: Diagnosis not present

## 2016-07-06 DIAGNOSIS — R55 Syncope and collapse: Secondary | ICD-10-CM | POA: Diagnosis not present

## 2016-07-06 DIAGNOSIS — E7800 Pure hypercholesterolemia, unspecified: Secondary | ICD-10-CM | POA: Diagnosis not present

## 2016-07-06 DIAGNOSIS — I10 Essential (primary) hypertension: Secondary | ICD-10-CM | POA: Diagnosis not present

## 2016-07-06 DIAGNOSIS — I482 Chronic atrial fibrillation: Secondary | ICD-10-CM | POA: Diagnosis not present

## 2016-07-07 DIAGNOSIS — R001 Bradycardia, unspecified: Secondary | ICD-10-CM | POA: Diagnosis not present

## 2016-07-07 DIAGNOSIS — N179 Acute kidney failure, unspecified: Secondary | ICD-10-CM | POA: Diagnosis not present

## 2016-07-07 DIAGNOSIS — I495 Sick sinus syndrome: Secondary | ICD-10-CM | POA: Diagnosis not present

## 2016-07-07 DIAGNOSIS — R55 Syncope and collapse: Secondary | ICD-10-CM | POA: Diagnosis not present

## 2016-07-07 DIAGNOSIS — I129 Hypertensive chronic kidney disease with stage 1 through stage 4 chronic kidney disease, or unspecified chronic kidney disease: Secondary | ICD-10-CM | POA: Diagnosis not present

## 2016-07-07 DIAGNOSIS — I10 Essential (primary) hypertension: Secondary | ICD-10-CM | POA: Diagnosis not present

## 2016-07-07 DIAGNOSIS — I480 Paroxysmal atrial fibrillation: Secondary | ICD-10-CM | POA: Diagnosis not present

## 2016-07-08 DIAGNOSIS — I10 Essential (primary) hypertension: Secondary | ICD-10-CM | POA: Diagnosis not present

## 2016-07-11 DIAGNOSIS — R001 Bradycardia, unspecified: Secondary | ICD-10-CM | POA: Diagnosis not present

## 2016-07-11 DIAGNOSIS — I480 Paroxysmal atrial fibrillation: Secondary | ICD-10-CM | POA: Diagnosis not present

## 2016-07-11 DIAGNOSIS — N179 Acute kidney failure, unspecified: Secondary | ICD-10-CM | POA: Diagnosis not present

## 2016-07-11 DIAGNOSIS — I10 Essential (primary) hypertension: Secondary | ICD-10-CM | POA: Diagnosis not present

## 2016-07-11 DIAGNOSIS — I129 Hypertensive chronic kidney disease with stage 1 through stage 4 chronic kidney disease, or unspecified chronic kidney disease: Secondary | ICD-10-CM | POA: Diagnosis not present

## 2016-07-11 DIAGNOSIS — R55 Syncope and collapse: Secondary | ICD-10-CM | POA: Diagnosis not present

## 2016-07-11 DIAGNOSIS — I495 Sick sinus syndrome: Secondary | ICD-10-CM | POA: Diagnosis not present

## 2016-07-12 DIAGNOSIS — J42 Unspecified chronic bronchitis: Secondary | ICD-10-CM | POA: Diagnosis not present

## 2016-07-12 DIAGNOSIS — G309 Alzheimer's disease, unspecified: Secondary | ICD-10-CM | POA: Diagnosis not present

## 2016-07-12 DIAGNOSIS — I509 Heart failure, unspecified: Secondary | ICD-10-CM | POA: Diagnosis not present

## 2016-07-12 DIAGNOSIS — Z7901 Long term (current) use of anticoagulants: Secondary | ICD-10-CM | POA: Diagnosis not present

## 2016-07-12 DIAGNOSIS — E7800 Pure hypercholesterolemia, unspecified: Secondary | ICD-10-CM | POA: Diagnosis not present

## 2016-07-12 DIAGNOSIS — F0280 Dementia in other diseases classified elsewhere without behavioral disturbance: Secondary | ICD-10-CM | POA: Diagnosis not present

## 2016-07-12 DIAGNOSIS — Z09 Encounter for follow-up examination after completed treatment for conditions other than malignant neoplasm: Secondary | ICD-10-CM | POA: Diagnosis not present

## 2016-07-12 DIAGNOSIS — I4891 Unspecified atrial fibrillation: Secondary | ICD-10-CM | POA: Diagnosis not present

## 2016-07-12 DIAGNOSIS — G603 Idiopathic progressive neuropathy: Secondary | ICD-10-CM | POA: Diagnosis not present

## 2016-07-12 DIAGNOSIS — I482 Chronic atrial fibrillation: Secondary | ICD-10-CM | POA: Diagnosis not present

## 2016-07-12 DIAGNOSIS — R55 Syncope and collapse: Secondary | ICD-10-CM | POA: Diagnosis not present

## 2016-07-12 DIAGNOSIS — J449 Chronic obstructive pulmonary disease, unspecified: Secondary | ICD-10-CM | POA: Diagnosis not present

## 2016-07-12 DIAGNOSIS — N184 Chronic kidney disease, stage 4 (severe): Secondary | ICD-10-CM | POA: Diagnosis not present

## 2016-07-12 DIAGNOSIS — I10 Essential (primary) hypertension: Secondary | ICD-10-CM | POA: Diagnosis not present

## 2016-07-12 DIAGNOSIS — M5416 Radiculopathy, lumbar region: Secondary | ICD-10-CM | POA: Diagnosis not present

## 2016-07-13 DIAGNOSIS — I10 Essential (primary) hypertension: Secondary | ICD-10-CM | POA: Diagnosis not present

## 2016-07-14 DIAGNOSIS — I10 Essential (primary) hypertension: Secondary | ICD-10-CM | POA: Diagnosis not present

## 2016-07-14 DIAGNOSIS — I482 Chronic atrial fibrillation: Secondary | ICD-10-CM | POA: Diagnosis not present

## 2016-07-14 DIAGNOSIS — R55 Syncope and collapse: Secondary | ICD-10-CM | POA: Diagnosis not present

## 2016-07-15 ENCOUNTER — Inpatient Hospital Stay: Admission: RE | Admit: 2016-07-15 | Payer: Self-pay | Source: Ambulatory Visit

## 2016-07-15 DIAGNOSIS — I10 Essential (primary) hypertension: Secondary | ICD-10-CM | POA: Diagnosis not present

## 2016-07-18 ENCOUNTER — Encounter
Admission: RE | Admit: 2016-07-18 | Discharge: 2016-07-18 | Disposition: A | Discharge status: Home / Self Care | Payer: Medicare HMO | Source: Ambulatory Visit | Attending: Cardiology | Admitting: Cardiology

## 2016-07-18 VITALS — BP 138/79 | HR 66 | Resp 16 | Ht 75.0 in | Wt 190.0 lb

## 2016-07-18 DIAGNOSIS — R001 Bradycardia, unspecified: Secondary | ICD-10-CM | POA: Diagnosis not present

## 2016-07-18 DIAGNOSIS — E559 Vitamin D deficiency, unspecified: Secondary | ICD-10-CM | POA: Diagnosis not present

## 2016-07-18 DIAGNOSIS — Z823 Family history of stroke: Secondary | ICD-10-CM | POA: Diagnosis not present

## 2016-07-18 DIAGNOSIS — G309 Alzheimer's disease, unspecified: Secondary | ICD-10-CM | POA: Diagnosis not present

## 2016-07-18 DIAGNOSIS — Z7901 Long term (current) use of anticoagulants: Secondary | ICD-10-CM | POA: Diagnosis not present

## 2016-07-18 DIAGNOSIS — F329 Major depressive disorder, single episode, unspecified: Secondary | ICD-10-CM | POA: Diagnosis not present

## 2016-07-18 DIAGNOSIS — Z79899 Other long term (current) drug therapy: Secondary | ICD-10-CM | POA: Diagnosis not present

## 2016-07-18 DIAGNOSIS — R5381 Other malaise: Secondary | ICD-10-CM

## 2016-07-18 DIAGNOSIS — I495 Sick sinus syndrome: Secondary | ICD-10-CM

## 2016-07-18 DIAGNOSIS — N183 Chronic kidney disease, stage 3 (moderate): Secondary | ICD-10-CM | POA: Diagnosis not present

## 2016-07-18 DIAGNOSIS — G4730 Sleep apnea, unspecified: Secondary | ICD-10-CM | POA: Diagnosis not present

## 2016-07-18 DIAGNOSIS — Z8673 Personal history of transient ischemic attack (TIA), and cerebral infarction without residual deficits: Secondary | ICD-10-CM | POA: Diagnosis not present

## 2016-07-18 DIAGNOSIS — I2510 Atherosclerotic heart disease of native coronary artery without angina pectoris: Secondary | ICD-10-CM | POA: Diagnosis not present

## 2016-07-18 DIAGNOSIS — Z952 Presence of prosthetic heart valve: Secondary | ICD-10-CM | POA: Diagnosis not present

## 2016-07-18 DIAGNOSIS — N179 Acute kidney failure, unspecified: Secondary | ICD-10-CM | POA: Diagnosis not present

## 2016-07-18 DIAGNOSIS — Z87440 Personal history of urinary (tract) infections: Secondary | ICD-10-CM | POA: Diagnosis not present

## 2016-07-18 DIAGNOSIS — J449 Chronic obstructive pulmonary disease, unspecified: Secondary | ICD-10-CM | POA: Diagnosis not present

## 2016-07-18 DIAGNOSIS — I129 Hypertensive chronic kidney disease with stage 1 through stage 4 chronic kidney disease, or unspecified chronic kidney disease: Secondary | ICD-10-CM | POA: Diagnosis not present

## 2016-07-18 DIAGNOSIS — D649 Anemia, unspecified: Secondary | ICD-10-CM | POA: Diagnosis not present

## 2016-07-18 DIAGNOSIS — I10 Essential (primary) hypertension: Secondary | ICD-10-CM | POA: Diagnosis not present

## 2016-07-18 DIAGNOSIS — F0280 Dementia in other diseases classified elsewhere without behavioral disturbance: Secondary | ICD-10-CM | POA: Diagnosis not present

## 2016-07-18 DIAGNOSIS — R55 Syncope and collapse: Secondary | ICD-10-CM | POA: Diagnosis not present

## 2016-07-18 DIAGNOSIS — Z87891 Personal history of nicotine dependence: Secondary | ICD-10-CM | POA: Diagnosis not present

## 2016-07-18 DIAGNOSIS — R011 Cardiac murmur, unspecified: Secondary | ICD-10-CM | POA: Diagnosis not present

## 2016-07-18 DIAGNOSIS — E782 Mixed hyperlipidemia: Secondary | ICD-10-CM | POA: Diagnosis not present

## 2016-07-18 DIAGNOSIS — I700 Atherosclerosis of aorta: Secondary | ICD-10-CM | POA: Diagnosis not present

## 2016-07-18 DIAGNOSIS — I480 Paroxysmal atrial fibrillation: Secondary | ICD-10-CM | POA: Diagnosis not present

## 2016-07-18 DIAGNOSIS — Z8249 Family history of ischemic heart disease and other diseases of the circulatory system: Secondary | ICD-10-CM | POA: Diagnosis not present

## 2016-07-18 DIAGNOSIS — I482 Chronic atrial fibrillation: Secondary | ICD-10-CM | POA: Diagnosis not present

## 2016-07-18 HISTORY — DX: Cardiac murmur, unspecified: R01.1

## 2016-07-18 HISTORY — DX: Depression, unspecified: F32.A

## 2016-07-18 HISTORY — DX: Atherosclerotic heart disease of native coronary artery without angina pectoris: I25.10

## 2016-07-18 HISTORY — DX: Major depressive disorder, single episode, unspecified: F32.9

## 2016-07-18 HISTORY — DX: Anemia, unspecified: D64.9

## 2016-07-18 HISTORY — DX: Hyperlipidemia, unspecified: E78.5

## 2016-07-18 HISTORY — DX: Chronic obstructive pulmonary disease, unspecified: J44.9

## 2016-07-18 LAB — SURGICAL PCR SCREEN
MRSA, PCR: NEGATIVE
STAPHYLOCOCCUS AUREUS: NEGATIVE

## 2016-07-18 NOTE — Patient Instructions (Addendum)
Your procedure is scheduled on: Wednesday 07/20/16 Report to [place]. 2ND FLOOR MEDICAL MALL ENTRANCE. To find out your arrival time please call [phone] between 1PM - 3PM on Tuesday 07/19/16.  Remember: Instructions that are not followed completely may result in serious medical risk, up to and including death, or upon the discretion of your surgeon and anesthesiologist your surgery may need to be rescheduled.    __X__ 1. Do not eat food or drink liquids after midnight. No gum chewing or hard candies.     __X__ 2. No Alcohol for 24 hours before or after surgery.   ____ 3. Bring all medications with you on the day of surgery if instructed.    __X__ 4. Notify your doctor if there is any change in your medical condition     (cold, fever, infections).             __X___5. No smoking within 24 hours of your surgery.     Do not wear jewelry, make-up, hairpins, clips or nail polish.  Do not wear lotions, powders, or perfumes.   Do not shave 48 hours prior to surgery. Men may shave face and neck.  Do not bring valuables to the hospital.    [facility] is not responsible for any belongings or valuables.               Contacts, dentures or bridgework may not be worn into surgery.  Leave your suitcase in the car. After surgery it may be brought to your room.  For patients admitted to the hospital, discharge time is determined by your                treatment team.   Patients discharged the day of surgery will not be allowed to drive home.   Please read over the following fact sheets that you were given:   Pain Booklet and MRSA Information   ____ Take these medicines the morning of surgery with A SIP OF WATER:    1. none  2.   3.   4.  5.  6.  ____ Fleet Enema (as directed)   __X__ Use CHG Soap as directed  __X__ Use inhalers on the day of surgery  ____ Stop metformin 2 days prior to surgery    ____ Take 1/2 of usual insulin dose the night before surgery and none on the  morning of surgery.   __X__ Stop Coumadin/Plavix/aspirin on as instructed by cardiologist  __X__ Stop Anti-inflammatories such as Advil, Aleve, Ibuprofen, Motrin, Naproxen, Naprosyn, Goodies,powder, or aspirin products.  OK to take Tylenol.   ____ Stop supplements until after surgery.    ____ Bring C-Pap to the hospital.

## 2016-07-19 DIAGNOSIS — I10 Essential (primary) hypertension: Secondary | ICD-10-CM | POA: Diagnosis not present

## 2016-07-19 MED ORDER — FAMOTIDINE 20 MG PO TABS
20.0000 mg | ORAL_TABLET | Freq: Once | ORAL | Status: AC
  Administered 2016-07-20: 20 mg via ORAL

## 2016-07-19 MED ORDER — CEFAZOLIN SODIUM-DEXTROSE 1-4 GM/50ML-% IV SOLN
1.0000 g | Freq: Once | INTRAVENOUS | Status: AC
  Administered 2016-07-20: 1 g via INTRAVENOUS

## 2016-07-19 MED ORDER — GENTAMICIN SULFATE 40 MG/ML IJ SOLN
Freq: Once | INTRAMUSCULAR | Status: DC
  Filled 2016-07-19: qty 2

## 2016-07-20 ENCOUNTER — Ambulatory Visit (INDEPENDENT_AMBULATORY_CARE_PROVIDER_SITE_OTHER): Payer: Self-pay | Admitting: Vascular Surgery

## 2016-07-20 ENCOUNTER — Encounter (INDEPENDENT_AMBULATORY_CARE_PROVIDER_SITE_OTHER): Payer: Self-pay

## 2016-07-20 ENCOUNTER — Ambulatory Visit: Payer: Medicare HMO | Admitting: Anesthesiology

## 2016-07-20 ENCOUNTER — Encounter
Admission: RE | Disposition: A | Discharge status: Home / Self Care | Payer: Self-pay | Source: Ambulatory Visit | Attending: Cardiology

## 2016-07-20 ENCOUNTER — Observation Stay
Admission: RE | Admit: 2016-07-20 | Discharge: 2016-07-21 | Disposition: A | Discharge status: Home / Self Care | Payer: Medicare HMO | Source: Ambulatory Visit | Attending: Cardiology | Admitting: Cardiology

## 2016-07-20 ENCOUNTER — Encounter: Payer: Self-pay | Admitting: *Deleted

## 2016-07-20 ENCOUNTER — Ambulatory Visit: Payer: Medicare HMO

## 2016-07-20 ENCOUNTER — Observation Stay: Payer: Medicare HMO

## 2016-07-20 ENCOUNTER — Other Ambulatory Visit (INDEPENDENT_AMBULATORY_CARE_PROVIDER_SITE_OTHER): Payer: Self-pay

## 2016-07-20 DIAGNOSIS — E559 Vitamin D deficiency, unspecified: Secondary | ICD-10-CM | POA: Insufficient documentation

## 2016-07-20 DIAGNOSIS — I129 Hypertensive chronic kidney disease with stage 1 through stage 4 chronic kidney disease, or unspecified chronic kidney disease: Secondary | ICD-10-CM | POA: Diagnosis not present

## 2016-07-20 DIAGNOSIS — Z79899 Other long term (current) drug therapy: Secondary | ICD-10-CM | POA: Insufficient documentation

## 2016-07-20 DIAGNOSIS — I495 Sick sinus syndrome: Secondary | ICD-10-CM | POA: Diagnosis not present

## 2016-07-20 DIAGNOSIS — N183 Chronic kidney disease, stage 3 (moderate): Secondary | ICD-10-CM | POA: Insufficient documentation

## 2016-07-20 DIAGNOSIS — Z87440 Personal history of urinary (tract) infections: Secondary | ICD-10-CM | POA: Insufficient documentation

## 2016-07-20 DIAGNOSIS — F0280 Dementia in other diseases classified elsewhere without behavioral disturbance: Secondary | ICD-10-CM | POA: Insufficient documentation

## 2016-07-20 DIAGNOSIS — F329 Major depressive disorder, single episode, unspecified: Secondary | ICD-10-CM | POA: Insufficient documentation

## 2016-07-20 DIAGNOSIS — Z8249 Family history of ischemic heart disease and other diseases of the circulatory system: Secondary | ICD-10-CM | POA: Insufficient documentation

## 2016-07-20 DIAGNOSIS — Z7901 Long term (current) use of anticoagulants: Secondary | ICD-10-CM | POA: Insufficient documentation

## 2016-07-20 DIAGNOSIS — D649 Anemia, unspecified: Secondary | ICD-10-CM | POA: Diagnosis not present

## 2016-07-20 DIAGNOSIS — I700 Atherosclerosis of aorta: Secondary | ICD-10-CM | POA: Diagnosis not present

## 2016-07-20 DIAGNOSIS — I2510 Atherosclerotic heart disease of native coronary artery without angina pectoris: Secondary | ICD-10-CM | POA: Insufficient documentation

## 2016-07-20 DIAGNOSIS — I482 Chronic atrial fibrillation: Secondary | ICD-10-CM | POA: Diagnosis not present

## 2016-07-20 DIAGNOSIS — I10 Essential (primary) hypertension: Secondary | ICD-10-CM | POA: Diagnosis not present

## 2016-07-20 DIAGNOSIS — R001 Bradycardia, unspecified: Secondary | ICD-10-CM | POA: Insufficient documentation

## 2016-07-20 DIAGNOSIS — Z87891 Personal history of nicotine dependence: Secondary | ICD-10-CM | POA: Insufficient documentation

## 2016-07-20 DIAGNOSIS — Z823 Family history of stroke: Secondary | ICD-10-CM | POA: Insufficient documentation

## 2016-07-20 DIAGNOSIS — Z952 Presence of prosthetic heart valve: Secondary | ICD-10-CM | POA: Insufficient documentation

## 2016-07-20 DIAGNOSIS — G309 Alzheimer's disease, unspecified: Secondary | ICD-10-CM | POA: Insufficient documentation

## 2016-07-20 DIAGNOSIS — G4730 Sleep apnea, unspecified: Secondary | ICD-10-CM | POA: Insufficient documentation

## 2016-07-20 DIAGNOSIS — J449 Chronic obstructive pulmonary disease, unspecified: Secondary | ICD-10-CM | POA: Insufficient documentation

## 2016-07-20 DIAGNOSIS — R011 Cardiac murmur, unspecified: Secondary | ICD-10-CM | POA: Insufficient documentation

## 2016-07-20 DIAGNOSIS — Z452 Encounter for adjustment and management of vascular access device: Secondary | ICD-10-CM | POA: Diagnosis not present

## 2016-07-20 DIAGNOSIS — Z8673 Personal history of transient ischemic attack (TIA), and cerebral infarction without residual deficits: Secondary | ICD-10-CM | POA: Insufficient documentation

## 2016-07-20 DIAGNOSIS — E782 Mixed hyperlipidemia: Secondary | ICD-10-CM | POA: Insufficient documentation

## 2016-07-20 DIAGNOSIS — Z950 Presence of cardiac pacemaker: Secondary | ICD-10-CM

## 2016-07-20 HISTORY — PX: PACEMAKER INSERTION: SHX728

## 2016-07-20 LAB — PROTIME-INR
INR: 1.83
PROTHROMBIN TIME: 21.4 s — AB (ref 11.4–15.2)

## 2016-07-20 SURGERY — INSERTION, CARDIAC PACEMAKER
Anesthesia: General | Laterality: Left

## 2016-07-20 MED ORDER — FENTANYL CITRATE (PF) 100 MCG/2ML IJ SOLN
INTRAMUSCULAR | Status: DC | PRN
  Administered 2016-07-20: 25 ug via INTRAVENOUS

## 2016-07-20 MED ORDER — ROSUVASTATIN CALCIUM 5 MG PO TABS
5.0000 mg | ORAL_TABLET | Freq: Every day | ORAL | Status: DC
  Administered 2016-07-20: 5 mg via ORAL
  Filled 2016-07-20: qty 1

## 2016-07-20 MED ORDER — FENTANYL CITRATE (PF) 100 MCG/2ML IJ SOLN: 25.0000 ug | INTRAMUSCULAR | Status: DC | PRN

## 2016-07-20 MED ORDER — FAMOTIDINE 20 MG PO TABS
ORAL_TABLET | ORAL | Status: AC
Start: 2016-07-20 — End: 2016-07-20
  Filled 2016-07-20: qty 1

## 2016-07-20 MED ORDER — CEFAZOLIN SODIUM-DEXTROSE 1-4 GM/50ML-% IV SOLN
INTRAVENOUS | Status: AC
  Filled 2016-07-20: qty 50

## 2016-07-20 MED ORDER — PROPOFOL 10 MG/ML IV BOLUS
INTRAVENOUS | Status: DC | PRN
  Administered 2016-07-20: 30 mg via INTRAVENOUS

## 2016-07-20 MED ORDER — SODIUM CHLORIDE 0.9 % IR SOLN
Status: DC | PRN
  Administered 2016-07-20: 225 mL

## 2016-07-20 MED ORDER — LACTATED RINGERS IV SOLN
INTRAVENOUS | Status: DC
  Administered 2016-07-20: 13:00:00 via INTRAVENOUS

## 2016-07-20 MED ORDER — PROPOFOL 500 MG/50ML IV EMUL
INTRAVENOUS | Status: AC
  Filled 2016-07-20: qty 50

## 2016-07-20 MED ORDER — LIDOCAINE 1 % OPTIME INJ - NO CHARGE
INTRAMUSCULAR | Status: DC | PRN
  Administered 2016-07-20: 23 mL

## 2016-07-20 MED ORDER — FUROSEMIDE 20 MG PO TABS
10.0000 mg | ORAL_TABLET | Freq: Two times a day (BID) | ORAL | Status: DC
  Administered 2016-07-20 – 2016-07-21 (×2): 10 mg via ORAL
  Filled 2016-07-20 (×2): qty 1

## 2016-07-20 MED ORDER — MEPERIDINE HCL 50 MG/ML IJ SOLN: 6.2500 mg | INTRAMUSCULAR | Status: DC | PRN

## 2016-07-20 MED ORDER — PHENYLEPHRINE HCL 10 MG/ML IJ SOLN
INTRAMUSCULAR | Status: DC | PRN
  Administered 2016-07-20: 100 ug via INTRAVENOUS

## 2016-07-20 MED ORDER — PROPOFOL 500 MG/50ML IV EMUL
INTRAVENOUS | Status: DC | PRN
  Administered 2016-07-20: 45 ug/kg/min via INTRAVENOUS

## 2016-07-20 MED ORDER — PROMETHAZINE HCL 25 MG/ML IJ SOLN: 6.2500 mg | INTRAMUSCULAR | Status: DC | PRN

## 2016-07-20 MED ORDER — CEFAZOLIN SODIUM-DEXTROSE 1-4 GM/50ML-% IV SOLN
1.0000 g | Freq: Four times a day (QID) | INTRAVENOUS | Status: AC
  Administered 2016-07-20 – 2016-07-21 (×3): 1 g via INTRAVENOUS
  Filled 2016-07-20 (×3): qty 50

## 2016-07-20 MED ORDER — ONDANSETRON HCL 4 MG/2ML IJ SOLN: 4.0000 mg | Freq: Four times a day (QID) | INTRAMUSCULAR | Status: DC | PRN

## 2016-07-20 MED ORDER — FENTANYL CITRATE (PF) 100 MCG/2ML IJ SOLN
INTRAMUSCULAR | Status: AC
  Filled 2016-07-20: qty 2

## 2016-07-20 MED ORDER — ACETAMINOPHEN 325 MG PO TABS
325.0000 mg | ORAL_TABLET | ORAL | Status: DC | PRN
  Administered 2016-07-21: 650 mg via ORAL
  Filled 2016-07-20: qty 2

## 2016-07-20 MED ORDER — ALBUTEROL SULFATE (2.5 MG/3ML) 0.083% IN NEBU: 2.5000 mg | INHALATION_SOLUTION | Freq: Four times a day (QID) | RESPIRATORY_TRACT | Status: DC | PRN

## 2016-07-20 MED ORDER — SODIUM CHLORIDE 0.9 % IJ SOLN
INTRAMUSCULAR | Status: DC | PRN
  Administered 2016-07-20: 3 mL

## 2016-07-20 MED ORDER — LEVOTHYROXINE SODIUM 25 MCG PO TABS
25.0000 ug | ORAL_TABLET | Freq: Every day | ORAL | Status: DC
  Administered 2016-07-21: 25 ug via ORAL
  Filled 2016-07-20: qty 1

## 2016-07-20 SURGICAL SUPPLY — 30 items
BAG DECANTER FOR FLEXI CONT (MISCELLANEOUS) ×3 IMPLANT
BRUSH SCRUB 4% CHG (MISCELLANEOUS) ×3 IMPLANT
CABLE SURG 12 DISP A/V CHANNEL (MISCELLANEOUS) ×3 IMPLANT
CANISTER SUCT 1200ML W/VALVE (MISCELLANEOUS) ×3 IMPLANT
CHLORAPREP W/TINT 26ML (MISCELLANEOUS) ×3 IMPLANT
COVER LIGHT HANDLE STERIS (MISCELLANEOUS) ×6 IMPLANT
COVER MAYO STAND STRL (DRAPES) ×3 IMPLANT
DRAPE C-ARM XRAY 36X54 (DRAPES) ×3 IMPLANT
DRSG TEGADERM 4X4.75 (GAUZE/BANDAGES/DRESSINGS) ×3 IMPLANT
DRSG TELFA 4X3 1S NADH ST (GAUZE/BANDAGES/DRESSINGS) ×3 IMPLANT
ELECT REM PT RETURN 9FT ADLT (ELECTROSURGICAL) ×3
ELECTRODE REM PT RTRN 9FT ADLT (ELECTROSURGICAL) ×1 IMPLANT
GLOVE BIO SURGEON STRL SZ7.5 (GLOVE) ×3 IMPLANT
GLOVE BIO SURGEON STRL SZ8 (GLOVE) ×3 IMPLANT
GOWN STRL REUS W/ TWL LRG LVL3 (GOWN DISPOSABLE) ×1 IMPLANT
GOWN STRL REUS W/ TWL XL LVL3 (GOWN DISPOSABLE) ×1 IMPLANT
GOWN STRL REUS W/TWL LRG LVL3 (GOWN DISPOSABLE) ×3
GOWN STRL REUS W/TWL XL LVL3 (GOWN DISPOSABLE) ×3
IMMOBILIZER SHDR XL LX WHT (SOFTGOODS) IMPLANT
IPG PACE AZUR XT SR MRI W1SR01 (Pacemaker) IMPLANT
IV NS 500ML (IV SOLUTION) ×3
IV NS 500ML BAXH (IV SOLUTION) ×1 IMPLANT
KIT RM TURNOVER STRD PROC AR (KITS) ×3 IMPLANT
LABEL OR SOLS (LABEL) ×3 IMPLANT
LEAD CAPSURE NOVUS 5076-58CM (Lead) ×2 IMPLANT
MARKER SKIN DUAL TIP RULER LAB (MISCELLANEOUS) ×3 IMPLANT
PACE AZURE XT SR MRI W1SR01 (Pacemaker) ×2 IMPLANT
PACK PACE INSERTION (MISCELLANEOUS) ×3 IMPLANT
PAD ONESTEP ZOLL R SERIES ADT (MISCELLANEOUS) ×3 IMPLANT
SUT SILK 0 SH 30 (SUTURE) ×9 IMPLANT

## 2016-07-20 NOTE — Anesthesia Preprocedure Evaluation (Signed)
Anesthesia Evaluation  Patient identified by MRN, date of birth, ID band Patient awake    Reviewed: Allergy & Precautions, NPO status , Patient's Chart, lab work & pertinent test results  History of Anesthesia Complications Negative for: history of anesthetic complications  Airway Mallampati: II  TM Distance: >3 FB Neck ROM: Full    Dental  (+) Edentulous Upper, Edentulous Lower   Pulmonary sleep apnea , COPD,  COPD inhaler, former smoker,    breath sounds clear to auscultation- rhonchi (-) wheezing      Cardiovascular hypertension, + CAD  (-) Past MI, (-) Cardiac Stents and (-) CABG + dysrhythmias Atrial Fibrillation  Rhythm:Regular Rate:Normal - Systolic murmurs and - Diastolic murmurs    Neuro/Psych PSYCHIATRIC DISORDERS Depression CVA    GI/Hepatic negative GI ROS, Neg liver ROS,   Endo/Other  neg diabetesHypothyroidism   Renal/GU Renal InsufficiencyRenal disease     Musculoskeletal negative musculoskeletal ROS (+)   Abdominal (+) - obese,   Peds  Hematology  (+) anemia ,   Anesthesia Other Findings Past Medical History: No date: Anemia No date: Atrial fibrillation (HCC) No date: Bradycardia No date: COPD (chronic obstructive pulmonary disease) (* No date: Coronary artery disease No date: Depression No date: Heart murmur No date: HLD (hyperlipidemia) No date: Hypertension No date: Hypothyroidism No date: Lumbar myelopathy (HCC) No date: MDS (myelodysplastic syndrome) (HCC) No date: OSA (obstructive sleep apnea) No date: Stroke [facility])     Comment: left sided weakness No date: Syncope   Reproductive/Obstetrics                             Anesthesia Physical Anesthesia Plan  ASA: III  Anesthesia Plan: General   Post-op Pain Management:    Induction: Intravenous  Airway Management Planned: Natural Airway  Additional Equipment:   Intra-op Plan:   Post-operative Plan:    Informed Consent: I have reviewed the patients History and Physical, chart, labs and discussed the procedure including the risks, benefits and alternatives for the proposed anesthesia with the patient or authorized representative who has indicated his/her understanding and acceptance.   Dental advisory given  Plan Discussed with: CRNA and Anesthesiologist  Anesthesia Plan Comments:         Anesthesia Quick Evaluation

## 2016-07-20 NOTE — H&P (Signed)
<6>46240-8<7>Social History<6>29762-2<7>Last Filed Vital Signs<6>8716-3<7>Progress Notes<6>10164-2<7>Plan of Treatment<6>18776-5<7>Visit Diagnoses<6>51848-0<7>/"> Jump to Section ? Document InformationEncounter DetailsLast Filed Vital SignsPatient DemographicsPlan of TreatmentProgress NotesSocial HistoryVisit Diagnoses Kyle Vasquez Encounter Summary, generated on Apr. 23, 2018 Printout Information  Document Contents Office Visit Document Received Date Apr. 23, 2018 Document Source Organization [place]   Patient Demographics - 83 y.o. 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  Encounter Details    Date Type Department Care Team Description  07/12/2016 Office Visit [facility]  [place], [place] [address]  [phone]  Sydnee Levans, MD  [place]  [facility]  [place], [place] [address]  [phone]  [phone] (Fax)  Syncope, unspecified syncope type (Primary Dx);  Essential hypertension;  Pure hypercholesterolemia;  Chronic atrial fibrillation (CMS-HCC);  Dementia in Alzheimer's disease   Social History - as of this encounter   Tobacco Use Types Packs/Day Years Used Date  Former Smoker    Quit: 08/28/1983  Smokeless Tobacco: Never Used      Alcohol Use Drinks/Week oz/Week Comments  No 0 Standard drinks or equivalent  0.0     Sex Assigned at Birth Date Recorded  Not on file    Last Filed Vital Signs - in this encounter   Vital Sign Reading Time Taken  Blood Pressure 110/60 07/12/2016 5:10 PM EDT  Pulse 55 07/12/2016 5:10 PM EDT  Temperature - -  Respiratory Rate - -  Oxygen Saturation - -  Inhaled Oxygen Concentration - -  Weight - -    Height - -  Body Mass Index - -   Progress Notes - in this encounter   Sydnee Levans, MD - 07/12/2016 4:45 PM EDT Formatting of this note may be different from the original.   Chief Complaint: No chief complaint on file. Date of Service: 07/12/2016 Date of Birth: [birth year] PCP: Azzie Glatter, MD  History of Present Illness: Kyle Vasquez is a 83 y.o.male patient who returns for follow-up visit. Has a history of hypertension as well as mitral valve disease. He is status post mitral valve repair in 2003. At previous visits he was sent to the emergency room due to bradycardia. Patient has had episodes of minimally responsive events. His grandson states that these occur only when he is sitting up. On number of occasions he has been sent to the emergency room where workup is completely unremarkable. He has none of these events when lying down. Many times he has had these events when having a UTI. Recent workup included head CT, carotid Dopplers and echocardiogram all of which were unremarkable. Past Medical and Surgical History  Past Medical History Past Medical History:  Diagnosis Date  . A-fib , unspecified (CMS-HCC)  . Abdominal aortic aneurysm (CMS-HCC)  noted to be 3.5 x 4.3 cm in April 2002. Underwent repair in 2004 by Dr. Su Ley [facility]  . Anemia, unspecified  . Cervical radiculopathy  Admitted to [facility] in Fall 2002 with  . Chronic airway obstruction, not elsewhere classified , unspecified (CMS-HCC)  . Coronary atherosclerosis of native coronary artery  . Depression, unspecified  . Heart murmur, unspecified  Valvular heart disease. 2D echocardiogram in November 2001 showed  . Hyperlipidemia, unspecified  . Hypertension  . Low serum vitamin  D  . 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 Other hammer toe (acquired)  . Sleep apnea   Past Surgical History He has a past surgical history that includes Back  Surgery and Cardiac valve replacement.   Medications and Allergies  Current Medications  Current Outpatient Prescriptions  Medication Sig Dispense Refill  . albuterol 90 mcg/actuation inhaler Inhale 2 inhalations into the lungs every 6 (six) hours as needed for Wheezing.  . cyanocobalamin (VITAMIN B12) 500 MCG tablet TAKE 1 TABLET BY MOUTH TWICE DAILY 180 tablet 1  . fluticasone-salmeterol (ADVAIR DISKUS) 250-50 mcg/dose diskus inhaler Inhale 1 inhalation into the lungs every 12 (twelve) hours. 1 Inhaler 6  . FUROsemide (LASIX) 20 MG tablet TAKE 1/2 TABLET BY MOUTH TWICE DAILY 30 tablet 3  . levothyroxine (SYNTHROID, LEVOTHROID) 25 MCG tablet Take 25 mcg by mouth once daily. [TAKE 1 CAPSULE BY MOUTH ONCE DAILY 30 MINUTES AFTER LARGEST MEAL.]  . rosuvastatin (CRESTOR) 5 MG tablet Take 5 mg by mouth once daily.   No current facility-administered medications for this visit.   Allergies: Patient has no known allergies.  Social and Family History  Social History reports that he quit smoking about 32 years ago. He has never used smokeless tobacco. He reports that he does not drink alcohol.  Family History Family History  Problem Relation Age of Onset  . Heart disease Mother  . No Known Problems Father  . Stroke Sister   Review of Systems  Review of Systems  Constitutional: Negative for chills, diaphoresis, fever, malaise/fatigue and weight loss.  HENT: Negative for congestion, ear discharge, hearing loss and tinnitus.  Eyes: Negative for blurred vision.  Respiratory: Negative for cough, hemoptysis, sputum production and wheezing.  Cardiovascular: Negative for chest pain, palpitations, orthopnea, claudication, leg swelling and PND.  Gastrointestinal: Negative for abdominal pain, blood in stool, constipation, diarrhea, heartburn, melena, nausea and vomiting.  Genitourinary: Negative for dysuria, frequency, hematuria and urgency.  Musculoskeletal: Negative for back pain, falls, joint  pain and myalgias.  Skin: Negative for itching and rash.  Neurological: Negative for dizziness, tingling, loss of consciousness, weakness and headaches.  Endo/Heme/Allergies: Negative for polydipsia. Does not bruise/bleed easily.  Psychiatric/Behavioral: Negative for depression, memory loss and substance abuse. The patient is not nervous/anxious.    Physical Examination   Vitals:BP 110/60  Pulse 55  Ht: Wt: ZOX:WRUEA is no height or weight on file to calculate BSA. There is no height or weight on file to calculate BMI.  Wt Readings from Last 3 Encounters:  05/05/16 88.5 kg (195 lb)  01/06/16 88.5 kg (195 lb)  12/30/15 88.5 kg (195 lb)   BP Readings from Last 3 Encounters:  07/12/16 110/60  07/12/16 108/64  07/06/16 124/80   General: African American male and with some shortness of breath no acute distress LUNGS Breath Sounds: Normal Percussion: Normal  CARDIOVASCULAR JVP CV wave: no HJR: no Elevation at 90 degrees: None Carotid Pulse: normal pulsation bilaterally Bruit: None Apex: apical impulse normal  Auscultation Rhythm: atrial fibrillation S1: normal S2: normal Clicks: no Rub: no Murmurs: 2/6 medium pitched mid systolic blowing at lower left sternal border  Gallop: None ABDOMEN Liver enlargement: no Pulsatile aorta: no Ascites: no Bruits: no  EXTREMITIES Clubbing: no Edema: trace to 1+ bilateral pedal edema Pulses: peripheral pulses symmetrical Femoral Bruits: no Amputation: no SKIN Rash: no Cyanosis: no Embolic phemonenon: no Bruising: no NEURO Alert and Oriented to person, place and time: yes Non focal: no LABS Last 3 CBC results: Lab Results  Component Value Date  WBC 3.1 (L) 12/23/2015  WBC 2.8 (L) 06/04/2015  WBC 3.2 (L) 02/05/2015   Lab Results  Component Value Date  HGB 9.9 (L) 12/23/2015  HGB 10.3 (L) 06/04/2015  HGB 10.9 (L) 02/05/2015   Lab Results  Component Value Date  HCT 29.9 (L) 12/23/2015  HCT 31.7 (L) 06/04/2015    HCT 34.1 (L) 02/05/2015   Lab Results  Component Value Date  PLT 146 (L) 12/23/2015  PLT 183 06/04/2015  PLT 170 02/05/2015   Lab Results  Component Value Date  CREATININE 2.6 (H) 12/23/2015  BUN 45 (H) 12/23/2015  NA 144 12/23/2015  K 4.3 12/23/2015  CL 108 12/23/2015  CO2 27.8 12/23/2015   Lab Results  Component Value Date  HDL 41.0 12/23/2015  HDL 37.3 06/04/2015  HDL 43.8 02/05/2015   Lab Results  Component Value Date  LDLCALC 157 (H) 12/23/2015  LDLCALC 162 (H) 06/04/2015  LDLCALC 202 (H) 02/05/2015   Lab Results  Component Value Date  TRIG 102 12/23/2015  TRIG 111 06/04/2015  TRIG 100 02/05/2015   Lab Results  Component Value Date  ALT 8 12/23/2015  AST 12 12/23/2015  ALKPHOS 56 12/23/2015   Lab Results  Component Value Date  TSH 3.918 12/23/2015   Assessment and Plan   83 y.o. male with  ICD-10-CM ICD-9-CM  1. Essential hypertension-currently hemodynamically stable. Blood pressure is low normal off of all drugs. Will remain off of this. Episodes that occur in the office appear to be secondary to orthostatic changes as they only occur when the patient is sitting upright for extended period of time as when he is in the doctor's office. He was again felt to be unresponsive in internal medicine. By the time he got up to our office he was fully responsive. He is hemodynamically stable. Electrocardiogram revealed atrial fibrillation with a fairly slow ventricular response in the mid 40s to lower 50s. He denies chest pain. I10 401.9  2. Mixed hyperlipidemia-continue with Crestor at 5 mg daily with a hemoglobin A1c goal of less than 100. Liver function is normal thus far in his current LDL is 190. E78.2 272.2  3. CKD (chronic kidney disease), stage 3 (moderate)-currently stable N18.3 585.3  4. History of CVA (cerebrovascular accident)-neuro status is stable today. Holter monitor is pending. Given relative bradycardia during these events will place a backup  pacemaker. Etiology of the events is unclear whether or not his bradycardia however will see if the pacemaker will help.. Z86.73 V12.54  5. Long term (current) use of anticoagulants [V58.61]-INR between 2 and 3 Z79.01 V58.61   6. Mitral valve disease-some shortness of breath. Clinically valve has no evidence of progression. Consideration for echocardiogram in the future if there is new symptomatology.  No Follow-up on file.  These notes generated with voice recognition software. I apologize for typographical errors.  Sydnee Levans, MD      Plan of Treatment - as of this encounter   Upcoming Encounters Upcoming Encounters  Date Type Specialty Care Team Description  07/27/2016 Office Visit Cardiology Fath, Aloha Gell, MD  [facility] [place]  [place], [place] [address]  [phone]  [phone] (Fax)    08/30/2016 Ancillary Orders Lab Azzie Glatter, [place]  [place], [place] [address]  [phone]  [phone] (Fax)    09/06/2016 Office Visit Internal Medicine Azzie Glatter, MD  [place]  [facility]  [place], [place] [address]  [phone]  [phone] (Fax)     Visit Diagnoses    Diagnosis  Syncope, unspecified syncope type - Primary  Essential hypertension  Pure hypercholesterolemia  Chronic atrial fibrillation (CMS-HCC)  Chronic atrial fibrillation   Dementia in Alzheimer's disease  Alzheimer's disease    Images Document Information   Primary Care Provider Azzie Glatter MD (Mar. 19, 2015 - Present) [phone] (Work) [phone] (Fax) [place], [place] [address]  Document Coverage Dates Apr. 17, 2018  [place], [place] [address]   Encounter Providers Sydnee Levans MD (Attending) [phone] (Work) [phone]  (Fax) [place], [place] [address]   Encounter Date Apr. 17, 2018   Show All Sections

## 2016-07-20 NOTE — Interval H&P Note (Signed)
History and Physical Interval Note:  07/20/2016 1:37 PM  Kyle Vasquez  has presented today for surgery, with the diagnosis of Sick Sinus Syndrome  The various methods of treatment have been discussed with the patient and family. After consideration of risks, benefits and other options for treatment, the patient has consented to  Procedure(s): INSERTION PACEMAKER (Left) as a surgical intervention .  The patient's history has been reviewed, patient examined, no change in status, stable for surgery.  I have reviewed the patient's chart and labs.  Questions were answered to the patient's satisfaction.     Hortensia Duffin [employer]

## 2016-07-20 NOTE — Progress Notes (Signed)
Admitted from PACU post pacemaker placement,denies pain,site is clean and clear and dressing intact,left arm immobilization at this time,close monitoring in progress

## 2016-07-20 NOTE — Interval H&P Note (Signed)
History and Physical Interval Note:  07/20/2016 1:37 PM  Kyle Vasquez  has presented today for surgery, with the diagnosis of Sick Sinus Syndrome  The various methods of treatment have been discussed with the patient and family. After consideration of risks, benefits and other options for treatment, the patient has consented to  Procedure(s): INSERTION PACEMAKER (Left) as a surgical intervention .  The patient's history has been reviewed, patient examined, no change in status, stable for surgery.  I have reviewed the patient's chart and labs.  Questions were answered to the patient's satisfaction.     Shimeka Bacot [employer]

## 2016-07-20 NOTE — Anesthesia Postprocedure Evaluation (Signed)
Anesthesia Post Note  Patient: Kyle Vasquez  Procedure(s) Performed: Procedure(s) (LRB): INSERTION PACEMAKER (Left)  Patient location during evaluation: PACU Anesthesia Type: General Level of consciousness: awake and alert and oriented Pain management: pain level controlled Vital Signs Assessment: post-procedure vital signs reviewed and stable Respiratory status: spontaneous breathing, nonlabored ventilation and respiratory function stable Cardiovascular status: blood pressure returned to baseline and stable Postop Assessment: no signs of nausea or vomiting Anesthetic complications: no     Last Vitals:  Vitals:   07/20/16 1235 07/20/16 1518  BP: (!) 150/94 118/78  Pulse: 70 70  Resp: 12 15  Temp: 36.6 C 36.6 C    Last Pain:  Vitals:   07/20/16 1518  TempSrc: Temporal                 Alejandrina Raimer

## 2016-07-20 NOTE — Transfer of Care (Signed)
Immediate Anesthesia Transfer of Care Note  Patient: Kyle Vasquez  Procedure(s) Performed: Procedure(s): INSERTION PACEMAKER (Left)  Patient Location: PACU  Anesthesia Type:MAC  Level of Consciousness: awake, alert  and oriented  Airway & Oxygen Therapy: Patient connected to face mask oxygen  Post-op Assessment: Post -op Vital signs reviewed and stable  Post vital signs: stable  Last Vitals:  Vitals:   07/20/16 1235 07/20/16 1518  BP: (!) 150/94 118/78  Pulse: 70 70  Resp: 12 15  Temp: 36.6 C 36.6 C    Last Pain:  Vitals:   07/20/16 1518  TempSrc: Temporal         Complications: No apparent anesthesia complications

## 2016-07-20 NOTE — Op Note (Signed)
[facility] Cardiology   07/20/2016                     3:19 PM  PATIENT:  Kyle Vasquez    PRE-OPERATIVE DIAGNOSIS:  Sick Sinus Syndrome  POST-OPERATIVE DIAGNOSIS:  Same  PROCEDURE:  INSERTION PACEMAKER  SURGEON:  Isaias Cowman, MD    ANESTHESIA:     PREOPERATIVE INDICATIONS:  Kyle Vasquez is a  83 y.o. male with a diagnosis of Sick Sinus Syndrome who failed conservative measures and elected for surgical management.    The risks benefits and alternatives were discussed with the patient preoperatively including but not limited to the risks of infection, bleeding, cardiopulmonary complications, the need for revision surgery, among others, and the patient was willing to proceed.   OPERATIVE PROCEDURE: The patient was brought to the operating room the fasting state. The left pectoral region was prepped and draped in the usual sterile manner. Anesthesia was obtained with 1% lidocaine locally. A 6 cm incision was performed a left pectoral region. The pacemaker pocket was generated by electrocautery and blunt dissection. Access was obtained to left subclavian vein by fine needle aspiration. MRI compatible lead was positioned into the right ventricular apical septum under fluoroscopic guidance. After proper thresholds were obtained the lead was sutured in place. The lead was connected to a MRI compatible single-chamber rate responsive pacemaker generator (Medtronic [id number]). The pacemaker pocket was irrigated with gentamicin solution. The pacemaker generator was positioned into the pocket, and the pocket was closed with 2-0 and 4-0 Vicryl, respectively. Steri-Strips and a pressure dressing were applied. There were no periprocedural complications.

## 2016-07-20 NOTE — Anesthesia Post-op Follow-up Note (Cosign Needed)
Anesthesia QCDR form completed.        

## 2016-07-21 DIAGNOSIS — N183 Chronic kidney disease, stage 3 (moderate): Secondary | ICD-10-CM | POA: Diagnosis not present

## 2016-07-21 DIAGNOSIS — J449 Chronic obstructive pulmonary disease, unspecified: Secondary | ICD-10-CM | POA: Diagnosis not present

## 2016-07-21 DIAGNOSIS — I129 Hypertensive chronic kidney disease with stage 1 through stage 4 chronic kidney disease, or unspecified chronic kidney disease: Secondary | ICD-10-CM | POA: Diagnosis not present

## 2016-07-21 DIAGNOSIS — I495 Sick sinus syndrome: Secondary | ICD-10-CM | POA: Diagnosis not present

## 2016-07-21 DIAGNOSIS — I482 Chronic atrial fibrillation: Secondary | ICD-10-CM | POA: Diagnosis not present

## 2016-07-21 DIAGNOSIS — R001 Bradycardia, unspecified: Secondary | ICD-10-CM | POA: Diagnosis not present

## 2016-07-21 DIAGNOSIS — I2510 Atherosclerotic heart disease of native coronary artery without angina pectoris: Secondary | ICD-10-CM | POA: Diagnosis not present

## 2016-07-21 DIAGNOSIS — F329 Major depressive disorder, single episode, unspecified: Secondary | ICD-10-CM | POA: Diagnosis not present

## 2016-07-21 DIAGNOSIS — D649 Anemia, unspecified: Secondary | ICD-10-CM | POA: Diagnosis not present

## 2016-07-21 MED ORDER — CEPHALEXIN 250 MG PO CAPS: 250.0000 mg | ORAL_CAPSULE | Freq: Four times a day (QID) | ORAL | 0 refills | Status: DC

## 2016-07-21 MED ORDER — ROSUVASTATIN CALCIUM 5 MG PO TABS: 5.0000 mg | ORAL_TABLET | Freq: Every day | ORAL | Status: AC

## 2016-07-21 MED ORDER — CEPHALEXIN 250 MG PO CAPS: 250.0000 mg | ORAL_CAPSULE | Freq: Four times a day (QID) | ORAL | 0 refills | Status: AC

## 2016-07-21 MED ORDER — LEVOTHYROXINE SODIUM 25 MCG PO TABS: 25.0000 ug | ORAL_TABLET | Freq: Every day | ORAL | 0 refills | Status: AC

## 2016-07-21 NOTE — Care Management Obs Status (Signed)
[facility] NOTIFICATION   Patient Details  Name: Kyle Vasquez MRN: [id number] Date of Birth: 06-03-1931   Medicare Observation Status Notification Given:  No  < 24 hours.  Out patient in bed procedure    Katrina Stack, RN 07/21/2016, 8:03 AM

## 2016-07-21 NOTE — Care Management (Signed)
Patient had been referred to [place] 4/6.  Contacted [facility] and confirmed patient currently open for nursing.  Obtained order to resume home health nursing

## 2016-07-21 NOTE — Progress Notes (Signed)
Discharged to home with his wife and 2 sons.  Pacemaker site is clean and dry.  Steri strips at the site.  Prescription for keflex given to patient.

## 2016-07-21 NOTE — Discharge Instructions (Signed)
For 6 weeks, avoid lifting greater than 15 pounds or raising your left arm above your head. You may shower in 24 hours, but avoid direct contact with the shower head to incision site. Leave steri-strips alone. Resume your Warfarin this Saturday, 4/28

## 2016-07-21 NOTE — Plan of Care (Signed)
Problem: Health Behavior/Discharge Planning: Goal: Ability to manage health-related needs will improve Outcome: Completed/Met Date Met: 07/21/16 Review and education regarding meds, site care, follow up.

## 2016-07-21 NOTE — Discharge Summary (Signed)
    Physician Discharge Summary  Patient ID: Kyle Vasquez MRN: [id number] DOB/AGE: 31-Mar-1931 83 y.o.  Admit date: 07/20/2016 Discharge date: 07/21/2016  Primary Discharge Diagnosis Sick sinus syndrome Secondary Discharge Diagnosis Same  Significant Diagnostic Studies: yes, Negative pneumothorax  Consults: None, Cardiology  Hospital Course: 83 year old male with a diagnosis of sick sinus syndrome who failed conservative measures and elected for surgical management. Patient successfully underwent single-chamber pacemaker insertion on 07/20/2016. The patient had an uneventful night stay and reports doing well this morning. He denies chest pain, significant pain at the insertion site, shortness of breath, or lightheadedness. Patient uses a wheelchair at home and has not ambulated this morning.    Discharge Exam: Blood pressure 136/86, pulse 76, temperature 97.9 F (36.6 C), temperature source Oral, resp. rate 17, height 6\' 6"  (1.981 m), weight 104.3 kg (230 lb), SpO2 100 %.   General appearance: alert, appears stated age and no distress Head: Normocephalic, without obvious abnormality, atraumatic Resp: clear to auscultation bilaterally and Normal effort of breathing Cardio: regular rate and rhythm, S1, S2 normal, no murmur, click, rub or gallop Extremities: extremities normal, atraumatic, no cyanosis or edema Pulses: 2+ and symmetric  Incision site: No active drainage or discharge. No erythema or warmth Labs:   Lab Results  Component Value Date   WBC 3.5 (L) 06/30/2016   HGB 8.4 (L) 06/30/2016   HCT 25.5 (L) 06/30/2016   MCV 89.8 06/30/2016   PLT 173 06/30/2016   No results for input(s): NA, K, CL, CO2, BUN, CREATININE, CALCIUM, PROT, BILITOT, ALKPHOS, ALT, AST, GLUCOSE in the last 168 hours.  Invalid input(s): LABALBU    Radiology: Negative for pneumothorax EKG: Atrial fibrillation with frequent ventricular-paced complexes, rate 67 bpm  FOLLOW UP [place], MD Follow up in 1 week(s).   Specialty:  Cardiology Contact information: [place] [address] [phone]           BRING ALL MEDICATIONS WITH YOU TO FOLLOW UP APPOINTMENTS  Time spent with patient to include physician time: 25 minutes Signed:  Clabe Seal PA-C 07/21/2016, 8:09 AM

## 2016-07-21 NOTE — Plan of Care (Signed)
Problem: Pain Managment: Goal: General experience of comfort will improve Outcome: Progressing Pt had complaints of left hand pain that radiated to left wrist. 2 tylenol given with relief. Will continue to monitor.  Problem: Cardiac: Goal: Ability to achieve and maintain adequate cardiopulmonary perfusion will improve Outcome: Completed/Met Date Met: 07/21/16 Pacemaker site, marked with scant drainage. Left arm still in immobilizer. Pt is picking up paced on telemetry.

## 2016-07-22 DIAGNOSIS — I10 Essential (primary) hypertension: Secondary | ICD-10-CM | POA: Diagnosis not present

## 2016-07-25 DIAGNOSIS — N179 Acute kidney failure, unspecified: Secondary | ICD-10-CM | POA: Diagnosis not present

## 2016-07-25 DIAGNOSIS — I495 Sick sinus syndrome: Secondary | ICD-10-CM | POA: Diagnosis not present

## 2016-07-25 DIAGNOSIS — R55 Syncope and collapse: Secondary | ICD-10-CM | POA: Diagnosis not present

## 2016-07-25 DIAGNOSIS — R001 Bradycardia, unspecified: Secondary | ICD-10-CM | POA: Diagnosis not present

## 2016-07-25 DIAGNOSIS — I129 Hypertensive chronic kidney disease with stage 1 through stage 4 chronic kidney disease, or unspecified chronic kidney disease: Secondary | ICD-10-CM | POA: Diagnosis not present

## 2016-07-25 DIAGNOSIS — I10 Essential (primary) hypertension: Secondary | ICD-10-CM | POA: Diagnosis not present

## 2016-07-25 DIAGNOSIS — I480 Paroxysmal atrial fibrillation: Secondary | ICD-10-CM | POA: Diagnosis not present

## 2016-07-26 DIAGNOSIS — I10 Essential (primary) hypertension: Secondary | ICD-10-CM | POA: Diagnosis not present

## 2016-07-26 DIAGNOSIS — S020XXA Fracture of vault of skull, initial encounter for closed fracture: Secondary | ICD-10-CM | POA: Diagnosis not present

## 2016-07-26 DIAGNOSIS — R32 Unspecified urinary incontinence: Secondary | ICD-10-CM | POA: Diagnosis not present

## 2016-07-27 DIAGNOSIS — I951 Orthostatic hypotension: Secondary | ICD-10-CM | POA: Diagnosis not present

## 2016-07-27 DIAGNOSIS — I480 Paroxysmal atrial fibrillation: Secondary | ICD-10-CM | POA: Diagnosis not present

## 2016-07-27 DIAGNOSIS — E7800 Pure hypercholesterolemia, unspecified: Secondary | ICD-10-CM | POA: Diagnosis not present

## 2016-07-27 DIAGNOSIS — I4891 Unspecified atrial fibrillation: Secondary | ICD-10-CM | POA: Diagnosis not present

## 2016-07-27 DIAGNOSIS — F0280 Dementia in other diseases classified elsewhere without behavioral disturbance: Secondary | ICD-10-CM | POA: Diagnosis not present

## 2016-07-27 DIAGNOSIS — R55 Syncope and collapse: Secondary | ICD-10-CM | POA: Diagnosis not present

## 2016-07-27 DIAGNOSIS — I482 Chronic atrial fibrillation: Secondary | ICD-10-CM | POA: Diagnosis not present

## 2016-07-27 DIAGNOSIS — I10 Essential (primary) hypertension: Secondary | ICD-10-CM | POA: Diagnosis not present

## 2016-07-27 DIAGNOSIS — G309 Alzheimer's disease, unspecified: Secondary | ICD-10-CM | POA: Diagnosis not present

## 2016-07-27 DIAGNOSIS — J42 Unspecified chronic bronchitis: Secondary | ICD-10-CM | POA: Diagnosis not present

## 2016-07-28 DIAGNOSIS — I10 Essential (primary) hypertension: Secondary | ICD-10-CM | POA: Diagnosis not present

## 2016-07-29 DIAGNOSIS — I10 Essential (primary) hypertension: Secondary | ICD-10-CM | POA: Diagnosis not present

## 2016-08-01 DIAGNOSIS — N179 Acute kidney failure, unspecified: Secondary | ICD-10-CM | POA: Diagnosis not present

## 2016-08-01 DIAGNOSIS — I480 Paroxysmal atrial fibrillation: Secondary | ICD-10-CM | POA: Diagnosis not present

## 2016-08-01 DIAGNOSIS — I129 Hypertensive chronic kidney disease with stage 1 through stage 4 chronic kidney disease, or unspecified chronic kidney disease: Secondary | ICD-10-CM | POA: Diagnosis not present

## 2016-08-01 DIAGNOSIS — R55 Syncope and collapse: Secondary | ICD-10-CM | POA: Diagnosis not present

## 2016-08-01 DIAGNOSIS — I495 Sick sinus syndrome: Secondary | ICD-10-CM | POA: Diagnosis not present

## 2016-08-01 DIAGNOSIS — I10 Essential (primary) hypertension: Secondary | ICD-10-CM | POA: Diagnosis not present

## 2016-08-01 DIAGNOSIS — R001 Bradycardia, unspecified: Secondary | ICD-10-CM | POA: Diagnosis not present

## 2016-08-02 DIAGNOSIS — I10 Essential (primary) hypertension: Secondary | ICD-10-CM | POA: Diagnosis not present

## 2016-08-03 DIAGNOSIS — I10 Essential (primary) hypertension: Secondary | ICD-10-CM | POA: Diagnosis not present

## 2016-08-04 DIAGNOSIS — I10 Essential (primary) hypertension: Secondary | ICD-10-CM | POA: Diagnosis not present

## 2016-08-05 DIAGNOSIS — I10 Essential (primary) hypertension: Secondary | ICD-10-CM | POA: Diagnosis not present

## 2016-08-09 DIAGNOSIS — I10 Essential (primary) hypertension: Secondary | ICD-10-CM | POA: Diagnosis not present

## 2016-08-10 DIAGNOSIS — I10 Essential (primary) hypertension: Secondary | ICD-10-CM | POA: Diagnosis not present

## 2016-08-11 DIAGNOSIS — I509 Heart failure, unspecified: Secondary | ICD-10-CM | POA: Diagnosis not present

## 2016-08-11 DIAGNOSIS — I10 Essential (primary) hypertension: Secondary | ICD-10-CM | POA: Diagnosis not present

## 2016-08-11 DIAGNOSIS — J449 Chronic obstructive pulmonary disease, unspecified: Secondary | ICD-10-CM | POA: Diagnosis not present

## 2016-08-11 DIAGNOSIS — I4891 Unspecified atrial fibrillation: Secondary | ICD-10-CM | POA: Diagnosis not present

## 2016-08-12 DIAGNOSIS — I10 Essential (primary) hypertension: Secondary | ICD-10-CM | POA: Diagnosis not present

## 2016-08-15 DIAGNOSIS — I10 Essential (primary) hypertension: Secondary | ICD-10-CM | POA: Diagnosis not present

## 2016-08-16 DIAGNOSIS — I10 Essential (primary) hypertension: Secondary | ICD-10-CM | POA: Diagnosis not present

## 2016-08-17 DIAGNOSIS — I10 Essential (primary) hypertension: Secondary | ICD-10-CM | POA: Diagnosis not present

## 2016-08-18 DIAGNOSIS — I10 Essential (primary) hypertension: Secondary | ICD-10-CM | POA: Diagnosis not present

## 2016-08-18 DIAGNOSIS — Z7901 Long term (current) use of anticoagulants: Secondary | ICD-10-CM | POA: Diagnosis not present

## 2016-08-18 DIAGNOSIS — I495 Sick sinus syndrome: Secondary | ICD-10-CM | POA: Diagnosis not present

## 2016-08-18 DIAGNOSIS — Z5181 Encounter for therapeutic drug level monitoring: Secondary | ICD-10-CM | POA: Diagnosis not present

## 2016-08-19 DIAGNOSIS — I10 Essential (primary) hypertension: Secondary | ICD-10-CM | POA: Diagnosis not present

## 2016-08-22 DIAGNOSIS — I10 Essential (primary) hypertension: Secondary | ICD-10-CM | POA: Diagnosis not present

## 2016-08-23 DIAGNOSIS — I10 Essential (primary) hypertension: Secondary | ICD-10-CM | POA: Diagnosis not present

## 2016-08-24 DIAGNOSIS — J42 Unspecified chronic bronchitis: Secondary | ICD-10-CM | POA: Diagnosis not present

## 2016-08-24 DIAGNOSIS — R42 Dizziness and giddiness: Secondary | ICD-10-CM | POA: Diagnosis not present

## 2016-08-24 DIAGNOSIS — I10 Essential (primary) hypertension: Secondary | ICD-10-CM | POA: Diagnosis not present

## 2016-08-24 DIAGNOSIS — N184 Chronic kidney disease, stage 4 (severe): Secondary | ICD-10-CM | POA: Diagnosis not present

## 2016-08-24 DIAGNOSIS — Z7901 Long term (current) use of anticoagulants: Secondary | ICD-10-CM | POA: Diagnosis not present

## 2016-08-24 DIAGNOSIS — I482 Chronic atrial fibrillation: Secondary | ICD-10-CM | POA: Diagnosis not present

## 2016-08-24 DIAGNOSIS — R5383 Other fatigue: Secondary | ICD-10-CM | POA: Diagnosis not present

## 2016-08-25 DIAGNOSIS — I10 Essential (primary) hypertension: Secondary | ICD-10-CM | POA: Diagnosis not present

## 2016-08-26 DIAGNOSIS — S020XXA Fracture of vault of skull, initial encounter for closed fracture: Secondary | ICD-10-CM | POA: Diagnosis not present

## 2016-08-26 DIAGNOSIS — R32 Unspecified urinary incontinence: Secondary | ICD-10-CM | POA: Diagnosis not present

## 2016-08-26 DIAGNOSIS — I10 Essential (primary) hypertension: Secondary | ICD-10-CM | POA: Diagnosis not present

## 2016-08-29 DIAGNOSIS — I10 Essential (primary) hypertension: Secondary | ICD-10-CM | POA: Diagnosis not present

## 2016-08-30 DIAGNOSIS — Z5181 Encounter for therapeutic drug level monitoring: Secondary | ICD-10-CM | POA: Diagnosis not present

## 2016-08-30 DIAGNOSIS — G603 Idiopathic progressive neuropathy: Secondary | ICD-10-CM | POA: Diagnosis not present

## 2016-08-30 DIAGNOSIS — Z0000 Encounter for general adult medical examination without abnormal findings: Secondary | ICD-10-CM | POA: Diagnosis not present

## 2016-08-30 DIAGNOSIS — I482 Chronic atrial fibrillation: Secondary | ICD-10-CM | POA: Diagnosis not present

## 2016-08-30 DIAGNOSIS — J42 Unspecified chronic bronchitis: Secondary | ICD-10-CM | POA: Diagnosis not present

## 2016-08-30 DIAGNOSIS — N183 Chronic kidney disease, stage 3 (moderate): Secondary | ICD-10-CM | POA: Diagnosis not present

## 2016-08-30 DIAGNOSIS — Z125 Encounter for screening for malignant neoplasm of prostate: Secondary | ICD-10-CM | POA: Diagnosis not present

## 2016-08-30 DIAGNOSIS — Z7901 Long term (current) use of anticoagulants: Secondary | ICD-10-CM | POA: Diagnosis not present

## 2016-08-30 DIAGNOSIS — I10 Essential (primary) hypertension: Secondary | ICD-10-CM | POA: Diagnosis not present

## 2016-08-31 DIAGNOSIS — I482 Chronic atrial fibrillation: Secondary | ICD-10-CM | POA: Diagnosis not present

## 2016-08-31 DIAGNOSIS — Z7901 Long term (current) use of anticoagulants: Secondary | ICD-10-CM | POA: Diagnosis not present

## 2016-08-31 DIAGNOSIS — I10 Essential (primary) hypertension: Secondary | ICD-10-CM | POA: Diagnosis not present

## 2016-08-31 DIAGNOSIS — G603 Idiopathic progressive neuropathy: Secondary | ICD-10-CM | POA: Diagnosis not present

## 2016-08-31 DIAGNOSIS — Z0000 Encounter for general adult medical examination without abnormal findings: Secondary | ICD-10-CM | POA: Diagnosis not present

## 2016-08-31 DIAGNOSIS — J42 Unspecified chronic bronchitis: Secondary | ICD-10-CM | POA: Diagnosis not present

## 2016-08-31 DIAGNOSIS — N183 Chronic kidney disease, stage 3 (moderate): Secondary | ICD-10-CM | POA: Diagnosis not present

## 2016-09-01 DIAGNOSIS — I10 Essential (primary) hypertension: Secondary | ICD-10-CM | POA: Diagnosis not present

## 2016-09-02 DIAGNOSIS — I10 Essential (primary) hypertension: Secondary | ICD-10-CM | POA: Diagnosis not present

## 2016-09-05 DIAGNOSIS — I10 Essential (primary) hypertension: Secondary | ICD-10-CM | POA: Diagnosis not present

## 2016-09-06 DIAGNOSIS — Z8673 Personal history of transient ischemic attack (TIA), and cerebral infarction without residual deficits: Secondary | ICD-10-CM | POA: Diagnosis not present

## 2016-09-06 DIAGNOSIS — R29898 Other symptoms and signs involving the musculoskeletal system: Secondary | ICD-10-CM | POA: Diagnosis not present

## 2016-09-06 DIAGNOSIS — I10 Essential (primary) hypertension: Secondary | ICD-10-CM | POA: Diagnosis not present

## 2016-09-06 DIAGNOSIS — R5383 Other fatigue: Secondary | ICD-10-CM | POA: Diagnosis not present

## 2016-09-06 DIAGNOSIS — N184 Chronic kidney disease, stage 4 (severe): Secondary | ICD-10-CM | POA: Diagnosis not present

## 2016-09-06 DIAGNOSIS — D649 Anemia, unspecified: Secondary | ICD-10-CM | POA: Diagnosis not present

## 2016-09-06 DIAGNOSIS — R5381 Other malaise: Secondary | ICD-10-CM | POA: Diagnosis not present

## 2016-09-06 DIAGNOSIS — J42 Unspecified chronic bronchitis: Secondary | ICD-10-CM | POA: Diagnosis not present

## 2016-09-06 DIAGNOSIS — I482 Chronic atrial fibrillation: Secondary | ICD-10-CM | POA: Diagnosis not present

## 2016-09-07 DIAGNOSIS — I10 Essential (primary) hypertension: Secondary | ICD-10-CM | POA: Diagnosis not present

## 2016-09-08 DIAGNOSIS — I10 Essential (primary) hypertension: Secondary | ICD-10-CM | POA: Diagnosis not present

## 2016-09-09 DIAGNOSIS — I10 Essential (primary) hypertension: Secondary | ICD-10-CM | POA: Diagnosis not present

## 2016-09-10 DIAGNOSIS — I10 Essential (primary) hypertension: Secondary | ICD-10-CM | POA: Diagnosis not present

## 2016-09-11 DIAGNOSIS — I10 Essential (primary) hypertension: Secondary | ICD-10-CM | POA: Diagnosis not present

## 2016-09-11 DIAGNOSIS — I4891 Unspecified atrial fibrillation: Secondary | ICD-10-CM | POA: Diagnosis not present

## 2016-09-11 DIAGNOSIS — I509 Heart failure, unspecified: Secondary | ICD-10-CM | POA: Diagnosis not present

## 2016-09-11 DIAGNOSIS — J449 Chronic obstructive pulmonary disease, unspecified: Secondary | ICD-10-CM | POA: Diagnosis not present

## 2016-09-12 DIAGNOSIS — Z5181 Encounter for therapeutic drug level monitoring: Secondary | ICD-10-CM | POA: Diagnosis not present

## 2016-09-12 DIAGNOSIS — I10 Essential (primary) hypertension: Secondary | ICD-10-CM | POA: Diagnosis not present

## 2016-09-12 DIAGNOSIS — Z7901 Long term (current) use of anticoagulants: Secondary | ICD-10-CM | POA: Diagnosis not present

## 2016-09-14 DIAGNOSIS — I10 Essential (primary) hypertension: Secondary | ICD-10-CM | POA: Diagnosis not present

## 2016-09-15 DIAGNOSIS — I10 Essential (primary) hypertension: Secondary | ICD-10-CM | POA: Diagnosis not present

## 2016-09-16 ENCOUNTER — Encounter (INDEPENDENT_AMBULATORY_CARE_PROVIDER_SITE_OTHER): Payer: Self-pay

## 2016-09-16 ENCOUNTER — Other Ambulatory Visit (INDEPENDENT_AMBULATORY_CARE_PROVIDER_SITE_OTHER): Payer: Self-pay

## 2016-09-16 ENCOUNTER — Ambulatory Visit (INDEPENDENT_AMBULATORY_CARE_PROVIDER_SITE_OTHER): Payer: Self-pay | Admitting: Vascular Surgery

## 2016-09-16 DIAGNOSIS — I10 Essential (primary) hypertension: Secondary | ICD-10-CM | POA: Diagnosis not present

## 2016-09-19 ENCOUNTER — Inpatient Hospital Stay
Admission: EM | Admit: 2016-09-19 | Discharge: 2016-09-21 | DRG: 313 | Disposition: A | Discharge status: Home / Self Care | Payer: Medicare Other | Attending: Internal Medicine | Admitting: Internal Medicine

## 2016-09-19 ENCOUNTER — Emergency Department: Payer: Medicare Other

## 2016-09-19 DIAGNOSIS — I2510 Atherosclerotic heart disease of native coronary artery without angina pectoris: Secondary | ICD-10-CM | POA: Diagnosis present

## 2016-09-19 DIAGNOSIS — R748 Abnormal levels of other serum enzymes: Secondary | ICD-10-CM | POA: Diagnosis not present

## 2016-09-19 DIAGNOSIS — Z1211 Encounter for screening for malignant neoplasm of colon: Secondary | ICD-10-CM | POA: Diagnosis not present

## 2016-09-19 DIAGNOSIS — R7989 Other specified abnormal findings of blood chemistry: Secondary | ICD-10-CM

## 2016-09-19 DIAGNOSIS — Z993 Dependence on wheelchair: Secondary | ICD-10-CM

## 2016-09-19 DIAGNOSIS — G4733 Obstructive sleep apnea (adult) (pediatric): Secondary | ICD-10-CM | POA: Diagnosis present

## 2016-09-19 DIAGNOSIS — R001 Bradycardia, unspecified: Secondary | ICD-10-CM | POA: Diagnosis present

## 2016-09-19 DIAGNOSIS — R0789 Other chest pain: Secondary | ICD-10-CM | POA: Diagnosis not present

## 2016-09-19 DIAGNOSIS — Z79899 Other long term (current) drug therapy: Secondary | ICD-10-CM | POA: Diagnosis not present

## 2016-09-19 DIAGNOSIS — I214 Non-ST elevation (NSTEMI) myocardial infarction: Secondary | ICD-10-CM | POA: Diagnosis present

## 2016-09-19 DIAGNOSIS — F0390 Unspecified dementia without behavioral disturbance: Secondary | ICD-10-CM | POA: Diagnosis present

## 2016-09-19 DIAGNOSIS — R791 Abnormal coagulation profile: Secondary | ICD-10-CM | POA: Diagnosis present

## 2016-09-19 DIAGNOSIS — D649 Anemia, unspecified: Secondary | ICD-10-CM | POA: Diagnosis present

## 2016-09-19 DIAGNOSIS — E785 Hyperlipidemia, unspecified: Secondary | ICD-10-CM | POA: Diagnosis not present

## 2016-09-19 DIAGNOSIS — I482 Chronic atrial fibrillation: Secondary | ICD-10-CM | POA: Diagnosis present

## 2016-09-19 DIAGNOSIS — I4891 Unspecified atrial fibrillation: Secondary | ICD-10-CM | POA: Diagnosis not present

## 2016-09-19 DIAGNOSIS — R1030 Lower abdominal pain, unspecified: Secondary | ICD-10-CM | POA: Diagnosis not present

## 2016-09-19 DIAGNOSIS — F329 Major depressive disorder, single episode, unspecified: Secondary | ICD-10-CM | POA: Diagnosis present

## 2016-09-19 DIAGNOSIS — Z79890 Hormone replacement therapy (postmenopausal): Secondary | ICD-10-CM | POA: Diagnosis not present

## 2016-09-19 DIAGNOSIS — I69359 Hemiplegia and hemiparesis following cerebral infarction affecting unspecified side: Secondary | ICD-10-CM | POA: Diagnosis not present

## 2016-09-19 DIAGNOSIS — Z7901 Long term (current) use of anticoagulants: Secondary | ICD-10-CM | POA: Diagnosis not present

## 2016-09-19 DIAGNOSIS — N184 Chronic kidney disease, stage 4 (severe): Secondary | ICD-10-CM | POA: Diagnosis present

## 2016-09-19 DIAGNOSIS — R778 Other specified abnormalities of plasma proteins: Secondary | ICD-10-CM

## 2016-09-19 DIAGNOSIS — M791 Myalgia, unspecified site: Secondary | ICD-10-CM

## 2016-09-19 DIAGNOSIS — Z87891 Personal history of nicotine dependence: Secondary | ICD-10-CM | POA: Diagnosis not present

## 2016-09-19 DIAGNOSIS — R52 Pain, unspecified: Secondary | ICD-10-CM

## 2016-09-19 DIAGNOSIS — R1010 Upper abdominal pain, unspecified: Secondary | ICD-10-CM | POA: Diagnosis not present

## 2016-09-19 DIAGNOSIS — J449 Chronic obstructive pulmonary disease, unspecified: Secondary | ICD-10-CM | POA: Diagnosis not present

## 2016-09-19 DIAGNOSIS — M4322 Fusion of spine, cervical region: Secondary | ICD-10-CM | POA: Diagnosis not present

## 2016-09-19 DIAGNOSIS — R079 Chest pain, unspecified: Secondary | ICD-10-CM

## 2016-09-19 DIAGNOSIS — I10 Essential (primary) hypertension: Secondary | ICD-10-CM | POA: Diagnosis not present

## 2016-09-19 LAB — CBC WITH DIFFERENTIAL/PLATELET
BASOS PCT: 0 %
Basophils Absolute: 0 10*3/uL (ref 0–0.1)
EOS PCT: 2 %
Eosinophils Absolute: 0.1 10*3/uL (ref 0–0.7)
HCT: 29.5 % — ABNORMAL LOW (ref 40.0–52.0)
Hemoglobin: 9.9 g/dL — ABNORMAL LOW (ref 13.0–18.0)
Lymphocytes Relative: 6 %
Lymphs Abs: 0.5 10*3/uL — ABNORMAL LOW (ref 1.0–3.6)
MCH: 30.2 pg (ref 26.0–34.0)
MCHC: 33.5 g/dL (ref 32.0–36.0)
MCV: 90.1 fL (ref 80.0–100.0)
MONO ABS: 0.7 10*3/uL (ref 0.2–1.0)
Monocytes Relative: 10 %
NEUTROS ABS: 6.2 10*3/uL (ref 1.4–6.5)
Neutrophils Relative %: 82 %
PLATELETS: 149 10*3/uL — AB (ref 150–440)
RBC: 3.27 MIL/uL — ABNORMAL LOW (ref 4.40–5.90)
RDW: 14 % (ref 11.5–14.5)
WBC: 7.6 10*3/uL (ref 3.8–10.6)

## 2016-09-19 LAB — CK: Total CK: 154 U/L (ref 49–397)

## 2016-09-19 LAB — COMPREHENSIVE METABOLIC PANEL
ALBUMIN: 3.5 g/dL (ref 3.5–5.0)
ALK PHOS: 64 U/L (ref 38–126)
ALT: 9 U/L — ABNORMAL LOW (ref 17–63)
ANION GAP: 9 (ref 5–15)
AST: 20 U/L (ref 15–41)
BILIRUBIN TOTAL: 0.5 mg/dL (ref 0.3–1.2)
BUN: 54 mg/dL — AB (ref 6–20)
CALCIUM: 9.1 mg/dL (ref 8.9–10.3)
CO2: 25 mmol/L (ref 22–32)
Chloride: 107 mmol/L (ref 101–111)
Creatinine, Ser: 3.82 mg/dL — ABNORMAL HIGH (ref 0.61–1.24)
GFR calc Af Amer: 15 mL/min — ABNORMAL LOW (ref >60)
GFR, EST NON AFRICAN AMERICAN: 13 mL/min — AB (ref >60)
GLUCOSE: 107 mg/dL — AB (ref 65–99)
Potassium: 3.9 mmol/L (ref 3.5–5.1)
Sodium: 141 mmol/L (ref 135–145)
TOTAL PROTEIN: 7.4 g/dL (ref 6.5–8.1)

## 2016-09-19 LAB — TROPONIN I
TROPONIN I: 0.04 ng/mL — AB (ref <0.03)
TROPONIN I: 0.05 ng/mL — AB (ref <0.03)
Troponin I: 0.05 ng/mL (ref <0.03)

## 2016-09-19 LAB — LIPID PANEL
CHOLESTEROL: 209 mg/dL — AB (ref 0–200)
HDL: 37 mg/dL — ABNORMAL LOW (ref >40)
LDL Cholesterol: 150 mg/dL — ABNORMAL HIGH (ref 0–99)
Total CHOL/HDL Ratio: 5.6 RATIO
Triglycerides: 110 mg/dL (ref <150)
VLDL: 22 mg/dL (ref 0–40)

## 2016-09-19 LAB — MAGNESIUM: Magnesium: 2.1 mg/dL (ref 1.7–2.4)

## 2016-09-19 LAB — PROTIME-INR
INR: 3.8
Prothrombin Time: 38.4 seconds — ABNORMAL HIGH (ref 11.4–15.2)

## 2016-09-19 MED ORDER — ONDANSETRON HCL 4 MG PO TABS: 4.0000 mg | ORAL_TABLET | Freq: Four times a day (QID) | ORAL | Status: DC | PRN

## 2016-09-19 MED ORDER — NITROGLYCERIN 0.4 MG SL SUBL: 0.4000 mg | SUBLINGUAL_TABLET | SUBLINGUAL | Status: DC | PRN

## 2016-09-19 MED ORDER — ACETAMINOPHEN 325 MG PO TABS
650.0000 mg | ORAL_TABLET | Freq: Four times a day (QID) | ORAL | Status: DC | PRN
  Administered 2016-09-20: 650 mg via ORAL
  Filled 2016-09-19: qty 2

## 2016-09-19 MED ORDER — CARVEDILOL 3.125 MG PO TABS
3.1250 mg | ORAL_TABLET | Freq: Two times a day (BID) | ORAL | Status: DC
  Administered 2016-09-20 – 2016-09-21 (×3): 3.125 mg via ORAL
  Filled 2016-09-19 (×3): qty 1

## 2016-09-19 MED ORDER — FUROSEMIDE 20 MG PO TABS
10.0000 mg | ORAL_TABLET | Freq: Two times a day (BID) | ORAL | Status: DC
  Administered 2016-09-19 – 2016-09-20 (×2): 10 mg via ORAL
  Filled 2016-09-19 (×2): qty 1

## 2016-09-19 MED ORDER — SODIUM CHLORIDE 0.9 % IV BOLUS (SEPSIS)
500.0000 mL | Freq: Once | INTRAVENOUS | Status: AC
  Administered 2016-09-19: 500 mL via INTRAVENOUS

## 2016-09-19 MED ORDER — ONDANSETRON HCL 4 MG/2ML IJ SOLN: 4.0000 mg | Freq: Four times a day (QID) | INTRAMUSCULAR | Status: DC | PRN

## 2016-09-19 MED ORDER — ASPIRIN EC 81 MG PO TBEC
81.0000 mg | DELAYED_RELEASE_TABLET | Freq: Every day | ORAL | Status: DC
  Administered 2016-09-20 – 2016-09-21 (×2): 81 mg via ORAL
  Filled 2016-09-19 (×2): qty 1

## 2016-09-19 MED ORDER — SENNOSIDES-DOCUSATE SODIUM 8.6-50 MG PO TABS: 1.0000 | ORAL_TABLET | Freq: Every evening | ORAL | Status: DC | PRN

## 2016-09-19 MED ORDER — ACETAMINOPHEN 500 MG PO TABS: 500.0000 mg | ORAL_TABLET | Freq: Every day | ORAL | Status: DC | PRN

## 2016-09-19 MED ORDER — CYANOCOBALAMIN 500 MCG PO TABS
500.0000 ug | ORAL_TABLET | Freq: Two times a day (BID) | ORAL | Status: DC
  Administered 2016-09-19 – 2016-09-21 (×4): 500 ug via ORAL
  Filled 2016-09-19 (×5): qty 1

## 2016-09-19 MED ORDER — OXYCODONE-ACETAMINOPHEN 5-325 MG PO TABS
1.0000 | ORAL_TABLET | Freq: Once | ORAL | Status: AC
  Administered 2016-09-19: 1 via ORAL

## 2016-09-19 MED ORDER — HEPARIN (PORCINE) IN NACL 100-0.45 UNIT/ML-% IJ SOLN
1100.0000 [IU]/h | INTRAMUSCULAR | Status: DC
  Administered 2016-09-19: 950 [IU]/h via INTRAVENOUS
  Filled 2016-09-19: qty 250

## 2016-09-19 MED ORDER — ALBUTEROL SULFATE (2.5 MG/3ML) 0.083% IN NEBU: 2.5000 mg | INHALATION_SOLUTION | Freq: Four times a day (QID) | RESPIRATORY_TRACT | Status: DC | PRN

## 2016-09-19 MED ORDER — HEPARIN BOLUS VIA INFUSION
3000.0000 [IU] | Freq: Once | INTRAVENOUS | Status: DC
  Filled 2016-09-19: qty 3000

## 2016-09-19 MED ORDER — BISACODYL 5 MG PO TBEC: 5.0000 mg | DELAYED_RELEASE_TABLET | Freq: Every day | ORAL | Status: DC | PRN

## 2016-09-19 MED ORDER — WARFARIN SODIUM 5 MG PO TABS: 5.0000 mg | ORAL_TABLET | Freq: Every day | ORAL | Status: DC

## 2016-09-19 MED ORDER — HYDROCODONE-ACETAMINOPHEN 5-325 MG PO TABS
1.0000 | ORAL_TABLET | ORAL | Status: DC | PRN
  Administered 2016-09-19 – 2016-09-20 (×2): 2 via ORAL
  Administered 2016-09-21 (×2): 1 via ORAL
  Filled 2016-09-19 (×2): qty 1
  Filled 2016-09-19 (×2): qty 2

## 2016-09-19 MED ORDER — OXYCODONE-ACETAMINOPHEN 5-325 MG PO TABS
ORAL_TABLET | ORAL | Status: AC
  Administered 2016-09-19: 1 via ORAL
  Filled 2016-09-19: qty 1

## 2016-09-19 MED ORDER — ACETAMINOPHEN 650 MG RE SUPP: 650.0000 mg | Freq: Four times a day (QID) | RECTAL | Status: DC | PRN

## 2016-09-19 MED ORDER — ALBUTEROL SULFATE HFA 108 (90 BASE) MCG/ACT IN AERS: 2.0000 | INHALATION_SPRAY | Freq: Four times a day (QID) | RESPIRATORY_TRACT | Status: DC | PRN

## 2016-09-19 MED ORDER — SODIUM CHLORIDE 0.9 % IV SOLN
INTRAVENOUS | Status: DC
  Administered 2016-09-19 – 2016-09-20 (×2): via INTRAVENOUS

## 2016-09-19 MED ORDER — ROSUVASTATIN CALCIUM 5 MG PO TABS
5.0000 mg | ORAL_TABLET | Freq: Every day | ORAL | Status: DC
  Administered 2016-09-20: 5 mg via ORAL
  Filled 2016-09-19: qty 1

## 2016-09-19 NOTE — Progress Notes (Signed)
[facility] received an OR to visit patient in regards to an AD. CH was informed that PT has a completed AD at home but it needs to be notarized. PT's son will bring it in tomorrow to be completed.   09/19/16 2005  Clinical Encounter Type  Visited With Patient and family together  Visit Type Initial  Referral From Nurse  Consult/Referral To Chaplain  Spiritual Encounters  Spiritual Needs Other (Comment)

## 2016-09-19 NOTE — Progress Notes (Signed)
ANTICOAGULATION CONSULT NOTE - Initial Consult  Pharmacy Consult for Heparin Indication: NSTEMI  No Known Allergies  Patient Measurements: Height: 6\' 5"  (195.6 cm) Weight: 172 lb 9.6 oz (78.3 kg) IBW/kg (Calculated) : 89.1 Heparin Dosing Weight: 78  Vital Signs: Temp: 97.8 F (36.6 C) (06/25 1932) Temp Source: Oral (06/25 1932) BP: 132/60 (06/25 1932) Pulse Rate: 86 (06/25 1932)  Labs:  Recent Labs  09/19/16 1322 09/19/16 1550  HGB 9.9*  --   HCT 29.5*  --   PLT 149*  --   LABPROT 38.4*  --   INR 3.80  --   CREATININE 3.82*  --   CKTOTAL 154  --   TROPONINI 0.04* 0.05*    Estimated Creatinine Clearance: 16.2 mL/min (A) (by C-G formula based on SCr of 3.82 mg/dL (H)).   Medical History: Past Medical History:  Diagnosis Date  . Anemia   . Atrial fibrillation ([place])   . Bradycardia   . COPD (chronic obstructive pulmonary disease) ([place])   . Coronary artery disease   . Depression   . Heart murmur   . HLD (hyperlipidemia)   . Hypertension   . Hypothyroidism   . Lumbar myelopathy ([place])   . MDS (myelodysplastic syndrome) ([place])   . OSA (obstructive sleep apnea)   . Stroke [facility])    left sided weakness  . Syncope     Medications:  Prescriptions Prior to Admission  Medication Sig Dispense Refill Last Dose  . acetaminophen (TYLENOL) 500 MG tablet Take 500 mg by mouth daily as needed for mild pain.   prn at prn  . albuterol (PROVENTIL HFA;VENTOLIN HFA) 108 (90 Base) MCG/ACT inhaler Inhale 2 puffs into the lungs every 6 (six) hours as needed for wheezing or shortness of breath.   prn at prn  . cyanocobalamin 500 MCG tablet Take 500 mcg by mouth 2 (two) times daily.   09/19/2016 at 0800  . furosemide (LASIX) 20 MG tablet Take 10 mg by mouth 2 (two) times daily.    09/19/2016 at 0800  . warfarin (COUMADIN) 5 MG tablet Take 5 mg by mouth daily at 6 PM.    09/18/2016 at 1800  . amLODipine (NORVASC) 5 MG tablet Take 1 tablet (5 mg total) by mouth daily. (Patient not  taking: Reported on 07/13/2016) 30 tablet 0 Not Taking at Unknown time  . levothyroxine (SYNTHROID, LEVOTHROID) 25 MCG tablet Take 1 tablet (25 mcg total) by mouth daily before breakfast. (Patient not taking: Reported on 09/19/2016) 30 tablet 0 Not Taking at Unknown time  . rosuvastatin (CRESTOR) 5 MG tablet Take 1 tablet (5 mg total) by mouth daily at 6 PM. (Patient not taking: Reported on 09/19/2016) 30 tablet  Not Taking at Unknown time   Scheduled:  . aspirin EC  81 mg Oral Daily  . [START ON 09/20/2016] carvedilol  3.125 mg Oral BID WC  . cyanocobalamin  500 mcg Oral BID  . furosemide  10 mg Oral BID  . heparin  3,000 Units Intravenous Once  . [START ON 09/20/2016] rosuvastatin  5 mg Oral q1800    Assessment: Pharmacy consulted to dose and monitor Heparin in this 83 year old male being treated for ACS Goal of Therapy:  Heparin level 0.3-0.7 units/ml Monitor platelets by anticoagulation protocol: Yes   Plan:  Since patient's INR supratherapeutic, will hold off on bolusing and will start heparin gtt at rate of 950 units/hr. Will check Heparin level @ 0400.    Shanayah Kaffenberger D 09/19/2016,7:41 PM

## 2016-09-19 NOTE — H&P (Signed)
[facility] NAME: Kyle Vasquez    MR#:  [id number]  DATE OF BIRTH:  [birth year]  DATE OF ADMISSION:  09/19/2016  PRIMARY CARE PHYSICIAN: Tracie Harrier, MD   REQUESTING/REFERRING PHYSICIAN: Dr. Burlene Arnt  CHIEF COMPLAINT:   Chest pain HISTORY OF PRESENT ILLNESS:  Kyle Vasquez  is a 83 y.o. male with a known history of Atrial fibrillation, cardiac valve replacement on Coumadin, COPD and wheelchair-bound state who presents with above complaint. Patient reports that since this morning he has had left-sided chest pain which radiates to his left neck and left arm. It was a 10 out of 10. Patient's currently chest pain-free. No relieving or aggravating factors. Troponin has trended up while patient was in the emergency room.  Of note patient is not the best historian. Son is at bedside who provides history of present illness.  PAST MEDICAL HISTORY:   Past Medical History:  Diagnosis Date  . Anemia   . Atrial fibrillation ([place])   . Bradycardia   . COPD (chronic obstructive pulmonary disease) ([place])   . Coronary artery disease   . Depression   . Heart murmur   . HLD (hyperlipidemia)   . Hypertension   . Hypothyroidism   . Lumbar myelopathy ([place])   . MDS (myelodysplastic syndrome) ([place])   . OSA (obstructive sleep apnea)   . Stroke [facility])    left sided weakness  . Syncope     PAST SURGICAL HISTORY:   Past Surgical History:  Procedure Laterality Date  . ABDOMINAL AORTIC ANEURYSM REPAIR    . BACK SURGERY    . CARDIAC SURGERY     mitral valve surgery  . CARDIAC VALVE REPLACEMENT    . NECK SURGERY    . PACEMAKER INSERTION Left 07/20/2016   Procedure: INSERTION PACEMAKER;  Surgeon: Isaias Cowman, MD;  Location: [facility] ORS;  Service: Cardiovascular;  Laterality: Left;    SOCIAL HISTORY:   Social History  Substance Use Topics  . Smoking status: Former Research scientist (life sciences)  . Smokeless tobacco: Former Systems developer  . Alcohol use No     FAMILY HISTORY:   Family History  Problem Relation Age of Onset  . Family history unknown: Yes    DRUG ALLERGIES:  No Known Allergies  REVIEW OF SYSTEMS:   Review of Systems  Constitutional: Negative.  Negative for chills, fever and malaise/fatigue.  HENT: Negative.  Negative for ear discharge, ear pain, hearing loss, nosebleeds and sore throat.   Eyes: Negative.  Negative for blurred vision and pain.  Respiratory: Negative.  Negative for cough, hemoptysis, shortness of breath and wheezing.   Cardiovascular: Positive for chest pain. Negative for palpitations and leg swelling.  Gastrointestinal: Negative.  Negative for abdominal pain, blood in stool, diarrhea, nausea and vomiting.  Genitourinary: Negative.  Negative for dysuria.  Musculoskeletal: Positive for back pain.  Skin: Negative.   Neurological: Negative for dizziness, tremors, speech change, focal weakness, seizures and headaches.  Endo/Heme/Allergies: Negative.  Does not bruise/bleed easily.  Psychiatric/Behavioral: Positive for memory loss. Negative for depression, hallucinations and suicidal ideas.    MEDICATIONS AT HOME:   Prior to Admission medications   Medication Sig Start Date End Date Taking? Authorizing Provider  acetaminophen (TYLENOL) 500 MG tablet Take 500 mg by mouth daily as needed for mild pain.   Yes [provider]  albuterol (PROVENTIL HFA;VENTOLIN HFA) 108 (90 Base) MCG/ACT inhaler Inhale 2 puffs into the lungs every 6 (six) hours as needed for wheezing or shortness  of breath.   Yes [provider]  cyanocobalamin 500 MCG tablet Take 500 mcg by mouth 2 (two) times daily.   Yes [provider]  furosemide (LASIX) 20 MG tablet Take 10 mg by mouth 2 (two) times daily.    Yes [provider]  warfarin (COUMADIN) 5 MG tablet Take 5 mg by mouth daily at 6 PM.    Yes [provider]  amLODipine (NORVASC) 5 MG tablet Take 1 tablet (5 mg total) by mouth  daily. Patient not taking: Reported on 07/13/2016 07/01/16   Fritzi Mandes, MD  levothyroxine (SYNTHROID, LEVOTHROID) 25 MCG tablet Take 1 tablet (25 mcg total) by mouth daily before breakfast. Patient not taking: Reported on 09/19/2016 07/22/16   Clabe Seal, PA-C  rosuvastatin (CRESTOR) 5 MG tablet Take 1 tablet (5 mg total) by mouth daily at 6 PM. Patient not taking: Reported on 09/19/2016 07/21/16   Clabe Seal, PA-C      VITAL SIGNS:  Blood pressure (!) 144/80, pulse 62, temperature 98.4 F (36.9 C), resp. rate 19, height 6\' 5"  (1.956 m), weight 78.3 kg (172 lb 9.6 oz), SpO2 100 %.  PHYSICAL EXAMINATION:   Physical Exam  Constitutional: He is oriented to person, place, and time and well-developed, well-nourished, and in no distress. No distress.  HENT:  Head: Normocephalic.  Eyes: No scleral icterus.  Neck: Normal range of motion. Neck supple. No JVD present. No tracheal deviation present.  Cardiovascular: Normal rate, regular rhythm and normal heart sounds.  Exam reveals no gallop and no friction rub.   No murmur heard. Pulmonary/Chest: Effort normal and breath sounds normal. No respiratory distress. He has no wheezes. He has no rales. He exhibits no tenderness.  Abdominal: Soft. Bowel sounds are normal. He exhibits no distension and no mass. There is no tenderness. There is no rebound and no guarding.  Musculoskeletal: He exhibits no edema.  0 out of 5 bilateral lower extremity strength which is at baseline  Neurological: He is alert and oriented to person, place, and time.  Skin: Skin is warm. No rash noted. No erythema.  Psychiatric: Affect normal.      LABORATORY PANEL:   CBC  Recent Labs Lab 09/19/16 1322  WBC 7.6  HGB 9.9*  HCT 29.5*  PLT 149*   ------------------------------------------------------------------------------------------------------------------  Chemistries   Recent Labs Lab 09/19/16 1322  NA 141  K 3.9  CL 107  CO2 25  GLUCOSE 107*  BUN 54*   CREATININE 3.82*  CALCIUM 9.1  MG 2.1  AST 20  ALT 9*  ALKPHOS 64  BILITOT 0.5   ------------------------------------------------------------------------------------------------------------------  Cardiac Enzymes  Recent Labs Lab 09/19/16 1550  TROPONINI 0.05*   ------------------------------------------------------------------------------------------------------------------  RADIOLOGY:  Dg Chest 2 View  Result Date: 09/19/2016 CLINICAL DATA:  Generalized body pain. No current complaints. History of atrial fibrillation, coronary artery disease, previous CVA, COPD. EXAM: CHEST  2 VIEW COMPARISON:  Chest x-ray of July 20, 2016 FINDINGS: The lungs are mildly hyperinflated but clear. The heart is mildly enlarged but stable. The pulmonary vascularity is normal. There is calcification in the wall of the aortic arch. The ICD is in stable position. There is a stent in the upper abdominal aorta. IMPRESSION: No active cardiopulmonary disease. Electronically Signed   By: David  Martinique M.D.   On: 09/19/2016 14:04   Ct Cervical Spine Wo Contrast  Result Date: 09/19/2016 CLINICAL DATA:  Radicular upper extremity pain EXAM: CT CERVICAL SPINE WITHOUT CONTRAST TECHNIQUE: Multidetector CT imaging of the cervical  spine was performed without intravenous contrast. Multiplanar CT image reconstructions were also generated. COMPARISON:  None. FINDINGS: Alignment: No static subluxation. Skull base and vertebrae: There is anterior cervical fusion from C3-C6 and posterior fusion from C3-C7. No hardware abnormality. Soft tissues and spinal canal: The spinal canal is severely obscured by streak artifact from spinal hardware. There is no epidural abnormality visualized cyst at the visible levels. Disc levels: There is moderate bony foraminal stenosis at the right C7-T1 foramen. The other visible neural foramina are patent. There is no bony spinal canal stenosis. Upper chest: Biapical emphysema. Other: None  IMPRESSION: 1. No acute abnormality of cervical spine. 2. Anterior C3-C6 and posterior C3-C7 spinal fusion without focal hardware abnormality. 3. Moderate right C7-T1 foraminal stenosis. Assessment of the spinal canal and neural foramina is otherwise limited by streak artifact from spinal hardware, but no other stenosis is visualized. 4.  Emphysema (ICD10-J43.9). Electronically Signed   By: Ulyses Jarred M.D.   On: 09/19/2016 14:05    EKG:  Atrial fibrillation no ST elevation or depression  IMPRESSION AND PLAN:   83 year old male with history of cardiac valve replacement and atrial fibrillation on anticoagulation who presents with chest pain.  1. Chest pain with elevated troponin: Cardiology consultation for further evaluation Telemetry monitoring Cycle enzymes Start aspirin, statin and Coreg  2. Atrial fibrillation/heart valve: Pharmacy consultation for Coumadin dosing  3. MDS with chronic anemia: Hemoglobin is at baseline  4. Lumbar myelopathy: PT consultation Patient is bedbound/wheelchair bound      All the records are reviewed and case discussed with ED provider. Management plans discussed with the patient and son and they are in agreement  CODE STATUS: FULL  TOTAL TIME TAKING CARE OF THIS PATIENT: 45 minutes.    Jet Traynham M.D on 09/19/2016 at 5:35 PM  Between 7am to 6pm - Pager - [phone]  After 6pm go to [email or web address] - password [email or web address]  Office  [phone]  CC: Primary care physician; Tracie Harrier, MD

## 2016-09-19 NOTE — Progress Notes (Signed)
Family Meeting Note  Advance Directive:no  Today a meeting took place with the Patient.and son  The following clinical team members were present during this meeting:MD  The following were discussed:Patient's diagnosis chest pain : , Patient's progosis: Unable to determine and Goals for treatment: Full Code  Additional follow-up to be provided: chaplain to start AD  Time spent during discussion:16 minutes  Leeandra Ellerson, MD

## 2016-09-19 NOTE — ED Notes (Signed)
Attempted to call report

## 2016-09-19 NOTE — ED Notes (Signed)
ED Provider at bedside. 

## 2016-09-19 NOTE — ED Notes (Addendum)
Report given to Jessie RN

## 2016-09-19 NOTE — ED Notes (Signed)
Patient transported to CT 

## 2016-09-19 NOTE — ED Provider Notes (Addendum)
[facility] Emergency Department Provider Note  ____________________________________________   I have reviewed the triage vital signs and the nursing notes.   HISTORY  Chief Complaint Generalized Body Aches    HPI Kyle Vasquez is a 83 y.o. male with a history of mild dysplastic syndrome, anemia atrial fibrillation COPD coronary artery disease depression, who is baseline bed bound, was baseline weakness in his lower extremities, prior CVA, presents today with aching hands. He states his hands but hurting in with a cramping discomfort off and on for the last couple days. Sometimes it seems that the pain goes across his chest. He denies any shortness of breath or exertional symptoms. When the cramps come and last for a few minutes. He thinks a pain pill make it better. He does not have any nausea or vomiting. He states most of the time of the pain is just in bilateral hands and focally present. No new weakness no neck pain no recent trauma. Patient does not have any shortness of breath no pleuritic chest pain, he is not having any pain at this time or when he does have at most the time assist in his forearms and his hands which are sore.His been no trauma, patient is demented and history is per him and his wife.      Past Medical History:  Diagnosis Date  . Anemia   . Atrial fibrillation ([place])   . Bradycardia   . COPD (chronic obstructive pulmonary disease) ([place])   . Coronary artery disease   . Depression   . Heart murmur   . HLD (hyperlipidemia)   . Hypertension   . Hypothyroidism   . Lumbar myelopathy ([place])   . MDS (myelodysplastic syndrome) ([place])   . OSA (obstructive sleep apnea)   . Stroke [facility])    left sided weakness  . Syncope     Patient Active Problem List   Diagnosis Date Noted  . Sick sinus syndrome ([place]) 07/20/2016  . Syncope 06/29/2016  . ARF (acute renal failure) ([place]) 06/29/2016  . Bradycardia 09/08/2015  . Symptomatic bradycardia  09/07/2015  . Syncope and collapse 09/07/2015  . Atrial fibrillation ([place]) 09/07/2015  . HTN (hypertension) 09/07/2015  . Physical deconditioning 09/07/2015  . H/O: CVA (cerebrovascular accident) 09/07/2015    Past Surgical History:  Procedure Laterality Date  . ABDOMINAL AORTIC ANEURYSM REPAIR    . BACK SURGERY    . CARDIAC SURGERY     mitral valve surgery  . CARDIAC VALVE REPLACEMENT    . NECK SURGERY    . PACEMAKER INSERTION Left 07/20/2016   Procedure: INSERTION PACEMAKER;  Surgeon: Isaias Cowman, MD;  Location: [facility] ORS;  Service: Cardiovascular;  Laterality: Left;    Prior to Admission medications   Medication Sig Start Date End Date Taking? Authorizing Provider  acetaminophen (TYLENOL) 500 MG tablet Take 500 mg by mouth daily as needed for mild pain.   Yes [provider]  albuterol (PROVENTIL HFA;VENTOLIN HFA) 108 (90 Base) MCG/ACT inhaler Inhale 2 puffs into the lungs every 6 (six) hours as needed for wheezing or shortness of breath.   Yes [provider]  cyanocobalamin 500 MCG tablet Take 500 mcg by mouth 2 (two) times daily.   Yes [provider]  furosemide (LASIX) 20 MG tablet Take 10 mg by mouth 2 (two) times daily.    Yes [provider]  warfarin (COUMADIN) 5 MG tablet Take 5 mg by mouth daily at 6 PM.    Yes [provider]  amLODipine (NORVASC) 5 MG tablet Take 1 tablet (5 mg total) by mouth daily. Patient not taking: Reported on 07/13/2016 07/01/16   Fritzi Mandes, MD  Fluticasone-Salmeterol (ADVAIR DISKUS) 250-50 MCG/DOSE AEPB Inhale 1 puff into the lungs daily as needed (shortness of breath).  01/31/13   [provider]  levothyroxine (SYNTHROID, LEVOTHROID) 25 MCG tablet Take 1 tablet (25 mcg total) by mouth daily before breakfast. 07/22/16   Clabe Seal, PA-C  rosuvastatin (CRESTOR) 5 MG tablet Take 1 tablet (5 mg total) by mouth daily at 6 PM. 07/21/16   Clabe Seal, PA-C    Allergies Patient has no known  allergies.  Family History  Problem Relation Age of Onset  . Family history unknown: Yes    Social History Social History  Substance Use Topics  . Smoking status: Former Research scientist (life sciences)  . Smokeless tobacco: Former Systems developer  . Alcohol use No    Review of Systems Constitutional: No fever/chills Eyes: No visual changes. ENT: No sore throat. No stiff neck no neck pain Cardiovascular: See history of present illness Respiratory: Denies shortness of breath. Gastrointestinal:   no vomiting.  No diarrhea.  No constipation. Genitourinary: Negative for dysuria. Musculoskeletal: Negative lower extremity swelling Skin: Negative for rash. Neurological: Negative for severe headaches, change in chronic numbness   ____________________________________________   PHYSICAL EXAM:  VITAL SIGNS: ED Triage Vitals  Enc Vitals Group     BP 09/19/16 1142 (!) 152/99     Pulse Rate 09/19/16 1200 (!) 56     Resp 09/19/16 1142 20     Temp 09/19/16 1142 98.4 F (36.9 C)     Temp src --      SpO2 09/19/16 1142 99 %     Weight 09/19/16 1135 172 lb 9.6 oz (78.3 kg)     Height 09/19/16 1135 6\' 5"  (1.956 m)     Head Circumference --      Peak Flow --      Pain Score 09/19/16 1135 0     Pain Loc --      Pain Edu? --      Excl. in [place]? --     Constitutional: Alert and orientedTo name and place unsure of the year. Well appearing and in no acute distress. Eyes: Conjunctivae are normal Head: Atraumatic HEENT: No congestion/rhinnorhea. Mucous membranes are moist.  Oropharynx non-erythematous Neck:   Nontender with no meningismus, no masses, no stridor Cardiovascular: Normal rate, regular rhythm. Grossly normal heart sounds.  Good peripheral circulation. Respiratory: Normal respiratory effort.  No retractions. Lungs CTAB. Abdominal: Soft and nontender. No distention. No guarding no rebound Back:  There is no focal tenderness or step off.  there is no midline tenderness there are no lesions noted. there is no CVA  tenderness Musculoskeletal: No lower extremity tenderness, no upper extremity tenderness. No joint effusions, no DVT signs strong distal pulses no edema  Neurologic:  Normal speech and language. Patient has a contracted right leg, he is diffusely weak but otherwise not focally so except for he won't move the right leg. This is chronic according to family. Grip strength appears to be approximately equal, no obvious focal new neurologic deficits noted Skin:  Skin is warm, dry and intact. No rash noted. Psychiatric: Mood and affect are normal. Speech and behavior are normal.  ____________________________________________   LABS (all labs ordered are listed, but only abnormal results are displayed)  Labs Reviewed  COMPREHENSIVE METABOLIC PANEL - Abnormal; Notable for the following:  Result Value   Glucose, Bld 107 (*)    BUN 54 (*)    Creatinine, Ser 3.82 (*)    ALT 9 (*)    GFR calc non Af Amer 13 (*)    GFR calc Af Amer 15 (*)    All other components within normal limits  CBC WITH DIFFERENTIAL/PLATELET - Abnormal; Notable for the following:    RBC 3.27 (*)    Hemoglobin 9.9 (*)    HCT 29.5 (*)    Platelets 149 (*)    Lymphs Abs 0.5 (*)    All other components within normal limits  PROTIME-INR - Abnormal; Notable for the following:    Prothrombin Time 38.4 (*)    All other components within normal limits  TROPONIN I - Abnormal; Notable for the following:    Troponin I 0.04 (*)    All other components within normal limits  TROPONIN I - Abnormal; Notable for the following:    Troponin I 0.05 (*)    All other components within normal limits  CK  MAGNESIUM   ____________________________________________  EKG  I personally interpreted any EKGs ordered by me or triage Artifact limits interpretation, likely atrial fibrillation with LAD no acute ST elevation or depression PVCs noted, rate of 76 ____________________________________________  RADIOLOGY  I reviewed any imaging  ordered by me or triage that were performed during my shift and, if possible, patient and/or family made aware of any abnormal findings. ____________________________________________   PROCEDURES  Procedure(s) performed: None  Procedures  Critical Care performed: None  ____________________________________________   INITIAL IMPRESSION / ASSESSMENT AND PLAN / ED COURSE  Pertinent labs & imaging results that were available during my care of the patient were reviewed by me and considered in my medical decision making (see chart for details).  Patient no acute distress has cramping sensation in his hands which he says sometimes seems to go towards his chest and strength seems to be intact differential is broad low suspicion for ACS however we did do 2 sets of cardiac markers which are the same as all of his other cardiac enzymes that we have checked over the last several years. Patient's CT does show some stenosis in his neck but no evidence of acute cord issue and I don't think that emergent MRI is indicated for cramping pain in his hands with no evidence of weakness.   He was requesting a "pain pill, which we have given him. Apparently he has had cramping pains in his hands before. He does have multiple neurologic and muscle problems. Work otherwise shows no significant change from baseline, his creatinine is 3.8 which is as it has been in the past, he runs between 3.5 and 3.8 over the last 2 months. I did discuss with the hospitalist service, given his troponin which is trending up, they would like to admit him for observation which we will do.   ____________________________________________   FINAL CLINICAL IMPRESSION(S) / ED DIAGNOSES  Final diagnoses:  None      This chart was dictated using voice recognition software.  Despite best efforts to proofread,  errors can occur which can change meaning.      Schuyler Amor, MD 09/19/16 1655    Schuyler Amor, MD 09/19/16  1656    Schuyler Amor, MD 09/19/16 [phone]

## 2016-09-19 NOTE — Progress Notes (Signed)
Admitted from the ED at 1850.  Telemetry initiated and verified.  Patient is very cold.  Registering a pulse oximetry was difficult.  Two warm blankets applied

## 2016-09-19 NOTE — ED Notes (Signed)
Light green top sent

## 2016-09-19 NOTE — Progress Notes (Signed)
ANTICOAGULATION CONSULT NOTE - Initial Consult  Pharmacy Consult for warfarin Indication: mechanical valve  No Known Allergies  Patient Measurements: Height: 6\' 5"  (195.6 cm) Weight: 172 lb 9.6 oz (78.3 kg) IBW/kg (Calculated) : 89.1 Heparin Dosing Weight:   Vital Signs: Temp: 98.4 F (36.9 C) (06/25 1142) BP: 130/80 (06/25 1700) Pulse Rate: 62 (06/25 1700)  Labs:  Recent Labs  09/19/16 1322 09/19/16 1550  HGB 9.9*  --   HCT 29.5*  --   PLT 149*  --   LABPROT 38.4*  --   INR 3.80  --   CREATININE 3.82*  --   CKTOTAL 154  --   TROPONINI 0.04* 0.05*    Estimated Creatinine Clearance: 16.2 mL/min (A) (by C-G formula based on SCr of 3.82 mg/dL (H)).   Medical History: Past Medical History:  Diagnosis Date  . Anemia   . Atrial fibrillation ([place])   . Bradycardia   . COPD (chronic obstructive pulmonary disease) ([place])   . Coronary artery disease   . Depression   . Heart murmur   . HLD (hyperlipidemia)   . Hypertension   . Hypothyroidism   . Lumbar myelopathy ([place])   . MDS (myelodysplastic syndrome) ([place])   . OSA (obstructive sleep apnea)   . Stroke [facility])    left sided weakness  . Syncope     Medications:   (Not in a hospital admission) Scheduled:   Assessment: Pharmacy consulted to dose and monitor warfarin in this 83 year old male who is chronically on warfarin therapy.  Home dose: warfarin 3 mg Po daily   INR resulted @ 3.8.   Goal of Therapy:  INR 2-3 Monitor platelets by anticoagulation protocol: Yes   Plan:  Will hold warfarin therapy for now. Will recheck INR in am.   Wave Calzada D 09/19/2016,6:07 PM

## 2016-09-19 NOTE — ED Triage Notes (Signed)
Pt bib EMS from home w/ c/o gen body pains. Pt A/O to self and situation. Pt sts that he has had random pains since this weekend in arms, legs, hands and once in chest. Pt sts pain random, denies pain at this time. Denies n/v/d, LOC or SOB. Pt pleasant. NAD.  Pt has pace maker

## 2016-09-20 ENCOUNTER — Inpatient Hospital Stay: Payer: Medicare Other

## 2016-09-20 DIAGNOSIS — R079 Chest pain, unspecified: Secondary | ICD-10-CM | POA: Diagnosis not present

## 2016-09-20 DIAGNOSIS — M791 Myalgia: Secondary | ICD-10-CM | POA: Diagnosis not present

## 2016-09-20 DIAGNOSIS — N183 Chronic kidney disease, stage 3 (moderate): Secondary | ICD-10-CM | POA: Diagnosis not present

## 2016-09-20 DIAGNOSIS — G4733 Obstructive sleep apnea (adult) (pediatric): Secondary | ICD-10-CM | POA: Diagnosis not present

## 2016-09-20 DIAGNOSIS — N184 Chronic kidney disease, stage 4 (severe): Secondary | ICD-10-CM | POA: Diagnosis not present

## 2016-09-20 DIAGNOSIS — I69359 Hemiplegia and hemiparesis following cerebral infarction affecting unspecified side: Secondary | ICD-10-CM | POA: Diagnosis not present

## 2016-09-20 DIAGNOSIS — I4891 Unspecified atrial fibrillation: Secondary | ICD-10-CM | POA: Diagnosis not present

## 2016-09-20 DIAGNOSIS — I2510 Atherosclerotic heart disease of native coronary artery without angina pectoris: Secondary | ICD-10-CM | POA: Diagnosis not present

## 2016-09-20 DIAGNOSIS — R7989 Other specified abnormal findings of blood chemistry: Secondary | ICD-10-CM

## 2016-09-20 DIAGNOSIS — R778 Other specified abnormalities of plasma proteins: Secondary | ICD-10-CM

## 2016-09-20 DIAGNOSIS — N179 Acute kidney failure, unspecified: Secondary | ICD-10-CM | POA: Diagnosis not present

## 2016-09-20 DIAGNOSIS — R001 Bradycardia, unspecified: Secondary | ICD-10-CM | POA: Diagnosis not present

## 2016-09-20 DIAGNOSIS — E785 Hyperlipidemia, unspecified: Secondary | ICD-10-CM | POA: Diagnosis not present

## 2016-09-20 DIAGNOSIS — M25512 Pain in left shoulder: Secondary | ICD-10-CM | POA: Diagnosis not present

## 2016-09-20 DIAGNOSIS — I482 Chronic atrial fibrillation: Secondary | ICD-10-CM | POA: Diagnosis not present

## 2016-09-20 DIAGNOSIS — D649 Anemia, unspecified: Secondary | ICD-10-CM | POA: Diagnosis not present

## 2016-09-20 DIAGNOSIS — R0789 Other chest pain: Secondary | ICD-10-CM | POA: Diagnosis not present

## 2016-09-20 LAB — TROPONIN I
TROPONIN I: 0.05 ng/mL — AB (ref <0.03)
Troponin I: 0.04 ng/mL (ref <0.03)

## 2016-09-20 LAB — PROTIME-INR
INR: 4.31 — AB
PROTHROMBIN TIME: 42.5 s — AB (ref 11.4–15.2)

## 2016-09-20 LAB — CBC
HCT: 27.2 % — ABNORMAL LOW (ref 40.0–52.0)
Hemoglobin: 9.2 g/dL — ABNORMAL LOW (ref 13.0–18.0)
MCH: 30.8 pg (ref 26.0–34.0)
MCHC: 33.8 g/dL (ref 32.0–36.0)
MCV: 91 fL (ref 80.0–100.0)
PLATELETS: 136 10*3/uL — AB (ref 150–440)
RBC: 2.98 MIL/uL — AB (ref 4.40–5.90)
RDW: 13.9 % (ref 11.5–14.5)
WBC: 7.9 10*3/uL (ref 3.8–10.6)

## 2016-09-20 LAB — HEPARIN LEVEL (UNFRACTIONATED): Heparin Unfractionated: 0.26 IU/mL — ABNORMAL LOW (ref 0.30–0.70)

## 2016-09-20 NOTE — Progress Notes (Signed)
Chaplain had an order requisition for prayer request for the patient. Chaplain went and prayed with the patient. Patient was very friendly And said that he had very good family support and that he had been  Married for 60 years. Patient was in good spirits and was very talkative.

## 2016-09-20 NOTE — Progress Notes (Signed)
Shoreline responded to an OR for prayer and visited with pt. Pt was watching worldcup soccer on tv at the time of this visit. Pt was calm and talked about his faith in God, his wife, his marriage, his children and grandchild. Pt exhibited optimism and faith that he states has been a means of coping with illness. Chaplain offered prayers, emotional support and presence.   09/20/16 1000  Clinical Encounter Type  Visited With Patient  Visit Type Initial;Spiritual support  Referral From Nurse  Consult/Referral To Chaplain  Spiritual Encounters  Spiritual Needs Prayer

## 2016-09-20 NOTE — Progress Notes (Signed)
To xray via bed 

## 2016-09-20 NOTE — Plan of Care (Signed)
Problem: Safety: Goal: Ability to remain free from injury will improve Outcome: Progressing Fall precautions in place, non skid socks when oob  Problem: Pain Managment: Goal: General experience of comfort will improve Outcome: Progressing Prn medications  Problem: Tissue Perfusion: Goal: Risk factors for ineffective tissue perfusion will decrease Outcome: Progressing Heparin gtt  Problem: Cardiac: Goal: Ability to achieve and maintain adequate cardiovascular perfusion will improve Outcome: Progressing Awaiting cardiology consult

## 2016-09-20 NOTE — Consult Note (Signed)
[place]  CARDIOLOGY CONSULT NOTE  Patient ID: Kyle Vasquez MRN: [id number] DOB/AGE: 1931/07/31 83 y.o.  Admit date: 09/19/2016 Referring Physician Dr. Vianne Bulls Primary Physician   Primary Cardiologist Dr. Ubaldo Glassing Reason for Consultation elevated troponin  HPI: Pt is a 83 yo male with history of hypertension, afib with controlled vr teated with warfarin for anticoagulation, history of aaa treated at [facility] in 2002, history of ckd 4 and cad admitted with left chest and shoulder pain. He had a mild troponin elevation to 0.05 and serum creatinine of 3.82. He has stabilized but still has some should pain. Pace maker is functionins normaly. EKG shows afib with ventricular paced rhythm.   Review of Systems  HENT: Negative.   Eyes: Negative.   Respiratory: Negative.   Cardiovascular: Positive for chest pain.  Gastrointestinal: Negative.   Genitourinary: Negative.   Musculoskeletal: Positive for joint pain.  Skin: Negative.   Neurological: Positive for weakness.  Endo/Heme/Allergies: Negative.   Psychiatric/Behavioral: Negative.     Past Medical History:  Diagnosis Date  . Anemia   . Atrial fibrillation ([place])   . Bradycardia   . COPD (chronic obstructive pulmonary disease) ([place])   . Coronary artery disease   . Depression   . Heart murmur   . HLD (hyperlipidemia)   . Hypertension   . Hypothyroidism   . Lumbar myelopathy ([place])   . MDS (myelodysplastic syndrome) ([name])   . OSA (obstructive sleep apnea)   . Stroke [facility])    left sided weakness  . Syncope     Family History  Problem Relation Age of Onset  . Family history unknown: Yes    Social History   Social History  . Marital status: Married    Spouse name: N/A  . Number of children: N/A  . Years of education: N/A   Occupational History  . Not on file.   Social History Main Topics  . Smoking status: Former Research scientist (life sciences)  . Smokeless tobacco: Former Systems developer  . Alcohol use No   . Drug use: No  . Sexual activity: Not on file   Other Topics Concern  . Not on file   Social History Narrative   Lives at home with wife, bedbound at baseline    Past Surgical History:  Procedure Laterality Date  . ABDOMINAL AORTIC ANEURYSM REPAIR    . BACK SURGERY    . CARDIAC SURGERY     mitral valve surgery  . CARDIAC VALVE REPLACEMENT    . NECK SURGERY    . PACEMAKER INSERTION Left 07/20/2016   Procedure: INSERTION PACEMAKER;  Surgeon: Isaias Cowman, MD;  Location: [facility] ORS;  Service: Cardiovascular;  Laterality: Left;     Prescriptions Prior to Admission  Medication Sig Dispense Refill Last Dose  . acetaminophen (TYLENOL) 500 MG tablet Take 500 mg by mouth daily as needed for mild pain.   prn at prn  . albuterol (PROVENTIL HFA;VENTOLIN HFA) 108 (90 Base) MCG/ACT inhaler Inhale 2 puffs into the lungs every 6 (six) hours as needed for wheezing or shortness of breath.   prn at prn  . cyanocobalamin 500 MCG tablet Take 500 mcg by mouth 2 (two) times daily.   09/19/2016 at 0800  . furosemide (LASIX) 20 MG tablet Take 10 mg by mouth 2 (two) times daily.    09/19/2016 at 0800  . warfarin (COUMADIN) 5 MG tablet Take 5 mg by mouth daily at 6 PM.    09/18/2016 at 1800  .  amLODipine (NORVASC) 5 MG tablet Take 1 tablet (5 mg total) by mouth daily. (Patient not taking: Reported on 07/13/2016) 30 tablet 0 Not Taking at Unknown time  . levothyroxine (SYNTHROID, LEVOTHROID) 25 MCG tablet Take 1 tablet (25 mcg total) by mouth daily before breakfast. (Patient not taking: Reported on 09/19/2016) 30 tablet 0 Not Taking at Unknown time  . rosuvastatin (CRESTOR) 5 MG tablet Take 1 tablet (5 mg total) by mouth daily at 6 PM. (Patient not taking: Reported on 09/19/2016) 30 tablet  Not Taking at Unknown time    Physical Exam: Blood pressure (!) 156/85, pulse (!) 127, temperature 97.5 F (36.4 C), resp. rate 18, height 6\' 5"  (1.956 m), weight 77.1 kg (169 lb 14.4 oz), SpO2 100 %.   Wt Readings from  Last 1 Encounters:  09/20/16 77.1 kg (169 lb 14.4 oz)     General appearance: alert and cooperative Resp: clear to auscultation bilaterally Chest wall: left sided chest wall tenderness Cardio: irregularly irregular rhythm GI: soft, non-tender; bowel sounds normal; no masses,  no organomegaly Extremities: extremities normal, atraumatic, no cyanosis or edema Neurologic: Grossly normal  Labs:   Lab Results  Component Value Date   WBC 7.9 09/20/2016   HGB 9.2 (L) 09/20/2016   HCT 27.2 (L) 09/20/2016   MCV 91.0 09/20/2016   PLT 136 (L) 09/20/2016    Recent Labs Lab 09/19/16 1322  NA 141  K 3.9  CL 107  CO2 25  BUN 54*  CREATININE 3.82*  CALCIUM 9.1  PROT 7.4  BILITOT 0.5  ALKPHOS 64  ALT 9*  AST 20  GLUCOSE 107*   Lab Results  Component Value Date   CKTOTAL 154 09/19/2016   CKMB 2.0 04/30/2013   TROPONINI 0.04 ([place]) 09/20/2016      Radiology: cxr-no active cardiopulmonary disease. Left shoulder xray with no acute findings with stable posttraumatic deformity of the proximal left humerous.  EKG: afib with ventricular paced rhythm.   ASSESSMENT AND PLAN:  Pt with history of multiple medical problems including cad, bradycardia treated with pacemaker, ckd 4, history of aaa treated at [facility] who was admitted with left chest  And shoulder pain. He has ruled out for n mi with elevated troponin due to increased demand and renal insuffiency. He is currently stable. Not a candidate for cardiac cath given renal insuffiency. Will conitnue with medical management including asa, warfarin, carvedilol 3.125 mg bid, crestor. Will follow as outpatient. No further invasive or non invasive cardiac workup indicated at present.  Signed: Teodoro Spray MD, [facility] 09/20/2016, 6:42 PM

## 2016-09-20 NOTE — Progress Notes (Addendum)
[facility] [place] NAME: Kyle Vasquez    MR#:  [id number]  DATE OF BIRTH:  1931/10/30  SUBJECTIVE:admitted for chest pain, slightly elevated troponins. Patient main complaint is left-sided shoulder pain and he is tender to touch on the left shoulder today. Troponin is  platued today at 0.05. INR is 4.5 today.   CHIEF COMPLAINT:   Chief Complaint  Patient presents with  . Generalized Body Aches    REVIEW OF SYSTEMS:   ROS CONSTITUTIONAL: No fever, fatigue or weakness.  EYES: No blurred or double vision.  EARS, NOSE, AND THROAT: No tinnitus or ear pain.  RESPIRATORY: No cough, shortness of breath, wheezing or hemoptysis.  CARDIOVASCULAR: No chest pain, orthopnea, edema.  GASTROINTESTINAL: No nausea, vomiting, diarrhea or abdominal pain.  GENITOURINARY: No dysuria, hematuria.  ENDOCRINE: No polyuria, nocturia,  HEMATOLOGY: No anemia, easy bruising or bleeding SKIN: No rash or lesion. MUSCULOSKELETAL: Shoulder pain, tender to touch on superior aspect of the left shoulder NEUROLOGIC: No tingling, numbness, weakness.  PSYCHIATRY: No anxiety or depression.   DRUG ALLERGIES:  No Known Allergies  VITALS:  Blood pressure (!) 156/85, pulse (!) 127, temperature 97.5 F (36.4 C), resp. rate 18, height 6\' 5"  (1.956 m), weight 77.1 kg (169 lb 14.4 oz), SpO2 100 %.  PHYSICAL EXAMINATION:  GENERAL:  83 y.o.-year-old patient lying in the bed with no acute distress.  EYES: Pupils equal, round, reactive to light and accommodation. No scleral icterus. Extraocular muscles intact.  HEENT: Head atraumatic, normocephalic. Oropharynx and nasopharynx clear.  NECK:  Supple, no jugular venous distention. No thyroid enlargement, no tenderness.  LUNGS: Normal breath sounds bilaterally, no wheezing, rales,rhonchi or crepitation. No use of accessory muscles of respiration.  CARDIOVASCULAR: S1, S2 normal. No murmurs, rubs, or gallops.  ABDOMEN: Soft,  nontender, nondistended. Bowel sounds present. No organomegaly or mass.  EXTREMITIES: No pedal edema, cyanosis, or clubbing.  NEUROLOGIC: Cranial nerves II through XII are intact. Muscle strength 5/5 in all extremities. Sensation intact. Gait not checked.  PSYCHIATRIC: The patient is alert and oriented x 3.  SKIN: No obvious rash, lesion, or ulcer.    LABORATORY PANEL:   CBC  Recent Labs Lab 09/20/16 0439  WBC 7.9  HGB 9.2*  HCT 27.2*  PLT 136*   ------------------------------------------------------------------------------------------------------------------  Chemistries   Recent Labs Lab 09/19/16 1322  NA 141  K 3.9  CL 107  CO2 25  GLUCOSE 107*  BUN 54*  CREATININE 3.82*  CALCIUM 9.1  MG 2.1  AST 20  ALT 9*  ALKPHOS 64  BILITOT 0.5   ------------------------------------------------------------------------------------------------------------------  Cardiac Enzymes  Recent Labs Lab 09/20/16 0652  TROPONINI 0.04*   ------------------------------------------------------------------------------------------------------------------  RADIOLOGY:  Dg Chest 2 View  Result Date: 09/19/2016 CLINICAL DATA:  Generalized body pain. No current complaints. History of atrial fibrillation, coronary artery disease, previous CVA, COPD. EXAM: CHEST  2 VIEW COMPARISON:  Chest x-ray of July 20, 2016 FINDINGS: The lungs are mildly hyperinflated but clear. The heart is mildly enlarged but stable. The pulmonary vascularity is normal. There is calcification in the wall of the aortic arch. The ICD is in stable position. There is a stent in the upper abdominal aorta. IMPRESSION: No active cardiopulmonary disease. Electronically Signed   By: David  Martinique M.D.   On: 09/19/2016 14:04   Ct Cervical Spine Wo Contrast  Result Date: 09/19/2016 CLINICAL DATA:  Radicular upper extremity pain EXAM: CT CERVICAL SPINE WITHOUT CONTRAST TECHNIQUE: Multidetector CT imaging of  the cervical spine was  performed without intravenous contrast. Multiplanar CT image reconstructions were also generated. COMPARISON:  None. FINDINGS: Alignment: No static subluxation. Skull base and vertebrae: There is anterior cervical fusion from C3-C6 and posterior fusion from C3-C7. No hardware abnormality. Soft tissues and spinal canal: The spinal canal is severely obscured by streak artifact from spinal hardware. There is no epidural abnormality visualized cyst at the visible levels. Disc levels: There is moderate bony foraminal stenosis at the right C7-T1 foramen. The other visible neural foramina are patent. There is no bony spinal canal stenosis. Upper chest: Biapical emphysema. Other: None IMPRESSION: 1. No acute abnormality of cervical spine. 2. Anterior C3-C6 and posterior C3-C7 spinal fusion without focal hardware abnormality. 3. Moderate right C7-T1 foraminal stenosis. Assessment of the spinal canal and neural foramina is otherwise limited by streak artifact from spinal hardware, but no other stenosis is visualized. 4.  Emphysema (ICD10-J43.9). Electronically Signed   By: Ulyses Jarred M.D.   On: 09/19/2016 14:05    EKG:   Orders placed or performed during the hospital encounter of 09/19/16  . EKG 12-Lead  . EKG 12-Lead    ASSESSMENT AND PLAN:   #1 musculoskeletal chest pain. Patient troponins are marginally elevated. Discussed with Dr. Mila Homer who is going to see the patient. #2/supratherapeutic INR without evidence of bleeding. Patient has mechanical valve. The INR around 3.5, sees consulted for management of Coumadin, check PT/INR tomorrow. INR today is 4.3. No indication to reverse it at this time.  #3 history of COPD: No wheezing. #4 dementia, depression has some memory gaps as baseline dementia. #5 MDS #6. history of stroke and left-sided weakness; continue wheelchair-bound,PT recommends no further PT need, #7 history of A. fib: with tachy  Brady syndrome? #8. Acute  On chronchronic kidney disease  stage III, creatinine baseline 3.5;continue IV fluids,  All the records are reviewed and case discussed with Care Management/Social Workerr. Management plans discussed with the patient, family and they are in agreement.  CODE STATUS:full  TOTAL TIME TAKING CARE OF THIS PATIENT: 35 minutes.   POSSIBLE D/C IN 1-2DAYS, DEPENDING ON CLINICAL CONDITION.   Epifanio Lesches M.D on 09/20/2016 at 12:01 PM  Between 7am to 6pm - Pager - [phone]  After 6pm go to [email or web address] - password [email or web address] [place] [facility]  Office  [phone]  CC: Primary care physician; Tracie Harrier, MD   Note: This dictation was prepared with Dragon dictation along with smaller phrase technology. Any transcriptional errors that result from this process are unintentional.

## 2016-09-20 NOTE — Progress Notes (Signed)
Back from xray

## 2016-09-20 NOTE — Care Management (Signed)
Admitted to [facility] with the diagnosis of NSTEMI. Lives with wife, Romie Minus [phone]). Last seen Dr. Ginette Pitman 09/06/16. Prescriptions are filled at [facility]. Followed by [facility] since 07/01/16. Personal Care services Monday-Friday, 4-5 hours a day. Uses [facility] for transportation. No skilled nursing. No home oxygen. No falls, Good appetite. Wheelchair bound.  Family will transport Shelbie Ammons RN MSN CCM Care Management [phone]

## 2016-09-20 NOTE — Progress Notes (Signed)
ANTICOAGULATION CONSULT NOTE - Initial Consult  Pharmacy Consult for Heparin Indication: NSTEMI  No Known Allergies  Patient Measurements: Height: 6\' 5"  (195.6 cm) Weight: 169 lb 14.4 oz (77.1 kg) IBW/kg (Calculated) : 89.1 Heparin Dosing Weight: 78  Vital Signs: Temp: 97.6 F (36.4 C) (06/26 0429) Temp Source: Oral (06/26 0429) BP: 141/59 (06/26 0429) Pulse Rate: 68 (06/26 0429)  Labs:  Recent Labs  09/19/16 1322 09/19/16 1550 09/19/16 1938 09/20/16 0104 09/20/16 0439  HGB 9.9*  --   --   --  9.2*  HCT 29.5*  --   --   --  27.2*  PLT 149*  --   --   --  136*  LABPROT 38.4*  --   --   --   --   INR 3.80  --   --   --   --   HEPARINUNFRC  --   --   --   --  0.26*  CREATININE 3.82*  --   --   --   --   CKTOTAL 154  --   --   --   --   TROPONINI 0.04* 0.05* 0.05* 0.05*  --     Estimated Creatinine Clearance: 16 mL/min (A) (by C-G formula based on SCr of 3.82 mg/dL (H)).   Medical History: Past Medical History:  Diagnosis Date  . Anemia   . Atrial fibrillation ([place])   . Bradycardia   . COPD (chronic obstructive pulmonary disease) ([place])   . Coronary artery disease   . Depression   . Heart murmur   . HLD (hyperlipidemia)   . Hypertension   . Hypothyroidism   . Lumbar myelopathy ([place])   . MDS (myelodysplastic syndrome) ([place])   . OSA (obstructive sleep apnea)   . Stroke [facility])    left sided weakness  . Syncope     Medications:  Prescriptions Prior to Admission  Medication Sig Dispense Refill Last Dose  . acetaminophen (TYLENOL) 500 MG tablet Take 500 mg by mouth daily as needed for mild pain.   prn at prn  . albuterol (PROVENTIL HFA;VENTOLIN HFA) 108 (90 Base) MCG/ACT inhaler Inhale 2 puffs into the lungs every 6 (six) hours as needed for wheezing or shortness of breath.   prn at prn  . cyanocobalamin 500 MCG tablet Take 500 mcg by mouth 2 (two) times daily.   09/19/2016 at 0800  . furosemide (LASIX) 20 MG tablet Take 10 mg by mouth 2 (two) times daily.     09/19/2016 at 0800  . warfarin (COUMADIN) 5 MG tablet Take 5 mg by mouth daily at 6 PM.    09/18/2016 at 1800  . amLODipine (NORVASC) 5 MG tablet Take 1 tablet (5 mg total) by mouth daily. (Patient not taking: Reported on 07/13/2016) 30 tablet 0 Not Taking at Unknown time  . levothyroxine (SYNTHROID, LEVOTHROID) 25 MCG tablet Take 1 tablet (25 mcg total) by mouth daily before breakfast. (Patient not taking: Reported on 09/19/2016) 30 tablet 0 Not Taking at Unknown time  . rosuvastatin (CRESTOR) 5 MG tablet Take 1 tablet (5 mg total) by mouth daily at 6 PM. (Patient not taking: Reported on 09/19/2016) 30 tablet  Not Taking at Unknown time   Scheduled:  . aspirin EC  81 mg Oral Daily  . carvedilol  3.125 mg Oral BID WC  . cyanocobalamin  500 mcg Oral BID  . furosemide  10 mg Oral BID  . rosuvastatin  5 mg Oral q1800    Assessment:  Pharmacy consulted to dose and monitor Heparin in this 83 year old male being treated for ACS Goal of Therapy:  Heparin level 0.3-0.7 units/ml Monitor platelets by anticoagulation protocol: Yes   Plan:  Since patient's INR supratherapeutic, will hold off on bolusing and will start heparin gtt at rate of 950 units/hr. Will check Heparin level @ 0400.   6/26 AM heparin level 0.26. No bolus as above. Increase rate to 1100 units/hr. Recheck in 8 hours.  Sim Boast, PharmD, BCPS  09/20/16 5:52 AM       Labrandon Knoch S 09/20/2016,5:51 AM

## 2016-09-20 NOTE — Progress Notes (Signed)
Date and time results received: 09/20/16 0830 (use smartphrase ".now" to insert current time)  Test: INR Critical Value: 4.31  Name of Provider Notified: Dr. Vianne Bulls  Orders Received? Or Actions Taken?: none

## 2016-09-20 NOTE — Progress Notes (Signed)
Dr. Vianne Bulls updated on xray results.

## 2016-09-20 NOTE — Progress Notes (Deleted)
Chaplain had an order requisition for a patient requests prayer. Chaplain came and prayed with the patient and the brother. The patient was very restless and the nurse said he was not  Doing well and that they needed comfort care. The sister also Entered and started crying when she saw how her brothers were Doing. So I prayed again with the siblings.

## 2016-09-20 NOTE — Progress Notes (Addendum)
PT Cancellation Note  Patient Details Name: Kyle Vasquez MRN: [id number] DOB: 03/16/1932   Cancelled Treatment:    Reason Eval/Treat Not Completed: PT screened, no needs identified, will sign off. Chart reviewed. Spoke with patient and family. Pt is dependent care at baseline and has not been able to stand for multiple years. He requires a lift to transfer from bed to wheelchair or sometimes his son picks him up and places him in his wheelchair. Patient's son keeps him on a rotation schedule for skin integrity. No PT needs identified. Safe to return to prior living environment. Will complete order. Please enter new order if status or needs change.   Lyndel Safe Huprich PT, DPT   Huprich,Jason 09/20/2016, 12:07 PM

## 2016-09-21 DIAGNOSIS — I69359 Hemiplegia and hemiparesis following cerebral infarction affecting unspecified side: Secondary | ICD-10-CM | POA: Diagnosis not present

## 2016-09-21 DIAGNOSIS — N183 Chronic kidney disease, stage 3 (moderate): Secondary | ICD-10-CM | POA: Diagnosis not present

## 2016-09-21 DIAGNOSIS — R791 Abnormal coagulation profile: Secondary | ICD-10-CM | POA: Diagnosis not present

## 2016-09-21 DIAGNOSIS — R079 Chest pain, unspecified: Secondary | ICD-10-CM | POA: Diagnosis not present

## 2016-09-21 DIAGNOSIS — N184 Chronic kidney disease, stage 4 (severe): Secondary | ICD-10-CM | POA: Diagnosis not present

## 2016-09-21 DIAGNOSIS — G4733 Obstructive sleep apnea (adult) (pediatric): Secondary | ICD-10-CM | POA: Diagnosis not present

## 2016-09-21 DIAGNOSIS — I2510 Atherosclerotic heart disease of native coronary artery without angina pectoris: Secondary | ICD-10-CM | POA: Diagnosis not present

## 2016-09-21 DIAGNOSIS — R0789 Other chest pain: Secondary | ICD-10-CM | POA: Diagnosis not present

## 2016-09-21 DIAGNOSIS — E785 Hyperlipidemia, unspecified: Secondary | ICD-10-CM | POA: Diagnosis not present

## 2016-09-21 DIAGNOSIS — M791 Myalgia: Secondary | ICD-10-CM | POA: Diagnosis not present

## 2016-09-21 DIAGNOSIS — I482 Chronic atrial fibrillation: Secondary | ICD-10-CM | POA: Diagnosis not present

## 2016-09-21 DIAGNOSIS — R001 Bradycardia, unspecified: Secondary | ICD-10-CM | POA: Diagnosis not present

## 2016-09-21 DIAGNOSIS — I4891 Unspecified atrial fibrillation: Secondary | ICD-10-CM | POA: Diagnosis not present

## 2016-09-21 DIAGNOSIS — D649 Anemia, unspecified: Secondary | ICD-10-CM | POA: Diagnosis not present

## 2016-09-21 LAB — BASIC METABOLIC PANEL
Anion gap: 5 (ref 5–15)
BUN: 56 mg/dL — AB (ref 6–20)
CHLORIDE: 111 mmol/L (ref 101–111)
CO2: 26 mmol/L (ref 22–32)
CREATININE: 4.04 mg/dL — AB (ref 0.61–1.24)
Calcium: 8.4 mg/dL — ABNORMAL LOW (ref 8.9–10.3)
GFR calc non Af Amer: 12 mL/min — ABNORMAL LOW (ref >60)
GFR, EST AFRICAN AMERICAN: 14 mL/min — AB (ref >60)
GLUCOSE: 98 mg/dL (ref 65–99)
Potassium: 4.1 mmol/L (ref 3.5–5.1)
Sodium: 142 mmol/L (ref 135–145)

## 2016-09-21 LAB — PROTIME-INR
INR: 4.9
Prothrombin Time: 47.1 seconds — ABNORMAL HIGH (ref 11.4–15.2)

## 2016-09-21 MED ORDER — HYDROCODONE-ACETAMINOPHEN 5-325 MG PO TABS: 1.0000 | ORAL_TABLET | ORAL | 0 refills | Status: AC | PRN

## 2016-09-21 MED ORDER — WARFARIN SODIUM 3 MG PO TABS: 3.0000 mg | ORAL_TABLET | Freq: Every day | ORAL | 2 refills | Status: DC

## 2016-09-21 NOTE — Progress Notes (Signed)
[place] responded to a page for pt. Pt wanted to competed Ad, [place] contacted a Insurance account manager and gathered 2 witnesses and have pt complete AD. A copy of AD was place in pt's file, and the original and one extra copy was given to pt. Pt is to be discharged this evening.    09/21/16 1600  Clinical Encounter Type  Visited With Patient;Patient and family together  Visit Type Follow-up  Referral From Nurse  Consult/Referral To Chaplain  Spiritual Encounters  Spiritual Needs Other (Comment)  Stress Factors  Patient Stress Factors Health changes  Family Stress Factors Major life changes  Advance Directives (For Healthcare)  Does Patient Have a Medical Advance Directive? Yes  Does patient want to make changes to medical advance directive? Yes (Inpatient - patient requests chaplain consult to change a medical advance directive)  Type of Advance Directive [place];Living will  Copy of [place] in Chart? Yes  Copy of Living Will in Chart? Yes  Would patient like information on creating a medical advance directive? Yes (Inpatient - patient requests chaplain consult to create a medical advance directive)  [place] Directives  Does Patient Have a Mental Health Advance Directive? No  Would patient like information on creating a mental health advance directive? No - Patient declined

## 2016-09-21 NOTE — Progress Notes (Signed)
CH made a follow with pt who had wanted to complete an AD. CH met with pt, but pt declined having wanted to complete AD. Pt states he is doing much better and did not need anything from [facility]. [place] provided spiritual support and presence.

## 2016-09-21 NOTE — Plan of Care (Signed)
Problem: Safety: Goal: Ability to remain free from injury will improve Outcome: Progressing Fall precautions in place, non skid socks when oob  Problem: Pain Managment: Goal: General experience of comfort will improve Outcome: Not Progressing PRN medications

## 2016-09-21 NOTE — Progress Notes (Signed)
Pt discharged to home via wc.  Instructions and rx given to pt.  Questions answered.  No distress.  

## 2016-09-21 NOTE — Discharge Summary (Signed)
Kyle Vasquez, is a 83 y.o. male  DOB 1931/08/19  MRN [id number].  Admission date:  09/19/2016  Admitting Physician  Bettey Costa, MD  Discharge Date:  09/21/2016   Primary MD  Tracie Harrier, MD  Recommendations for primary care physician for things to follow:  Follow-up with PCP in 1 week   Admission Diagnosis  Myalgia [M79.1] Chest pain, unspecified type [R07.9]   Discharge Diagnosis  Myalgia [M79.1] Chest pain, unspecified type [R07.9]    Active Problems:   Elevated troponin      Past Medical History:  Diagnosis Date  . Anemia   . Atrial fibrillation ([place])   . Bradycardia   . COPD (chronic obstructive pulmonary disease) ([place])   . Coronary artery disease   . Depression   . Heart murmur   . HLD (hyperlipidemia)   . Hypertension   . Hypothyroidism   . Lumbar myelopathy ([place])   . MDS (myelodysplastic syndrome) ([place])   . OSA (obstructive sleep apnea)   . Stroke [facility])    left sided weakness  . Syncope     Past Surgical History:  Procedure Laterality Date  . ABDOMINAL AORTIC ANEURYSM REPAIR    . BACK SURGERY    . CARDIAC SURGERY     mitral valve surgery  . CARDIAC VALVE REPLACEMENT    . NECK SURGERY    . PACEMAKER INSERTION Left 07/20/2016   Procedure: INSERTION PACEMAKER;  Surgeon: Isaias Cowman, MD;  Location: [facility] ORS;  Service: Cardiovascular;  Laterality: Left;       History of present illness and  Hospital Course:     Kindly see H&P for history of present illness and admission details, please review complete Labs, Consult reports and Test reports for all details in brief  HPI  from the history and physical done on the day of admission 83 year old male patient with history of chronic atrial fibrillation, Mechanical mitral valve, pacemaker insertion for history of bradycardia, AAA  repair, chronic kidney disease stage III admitted because of left-sided chest pain, left shoulder pain and minimally elevated troponins.  Hospital Course  #1. left-sided chest pain, left shoulder pain likely musculoskeletal origin. Troponins are mildly elevated at 0.05, cardiology Dr. Ubaldo Glassing has seen the patient at did not recommend any further cardiac workup.  #2 elevated INR within the range of 4.9. No evidence of bleeding. Patient Coumadin is stopped. Advised the patient to resume Coumadin from 29th at the lower dose of 3 mg of warfarin every day, patient needs PT/INR check with PCP on Monday.  #3./Shoulder pain, x-ray of the left shoulder showed degenerative disease, pain is better with Percocet. Prescribe her 30 tablets of Percocet.  #4 chronic kidney disease stage IV; baseline creatinine around 3.5.-4. Follow up with nephrology as an outpatient. Hold furosemide at discharge. Repeat kidney function labs, PT/INR on Monday, July 2 with PCP. Acute on chronic renal failure with chronic kidney disease stage IV: Received IV hydration.  Discharge Condition: stable   Follow UP  Follow-up Information    Hande, Vishwanath, MD Follow up in 1 week(s).   Specialty:  Internal Medicine Contact information: [place] [address] [phone]        Teodoro Spray, MD Follow up in 1 week(s).   Specialty:  Cardiology Contact information: [place] [address] [phone]             Discharge Instructions  and  Discharge Medications      Allergies  as of 09/21/2016   No Known Allergies     Medication List    STOP taking these medications   acetaminophen 500 MG tablet Commonly known as:  TYLENOL     TAKE these medications   albuterol 108 (90 Base) MCG/ACT inhaler Commonly known as:  PROVENTIL HFA;VENTOLIN HFA Inhale 2 puffs into the lungs every 6 (six) hours as needed for wheezing or shortness of breath.   amLODipine  5 MG tablet Commonly known as:  NORVASC Take 1 tablet (5 mg total) by mouth daily.   cyanocobalamin 500 MCG tablet Take 500 mcg by mouth 2 (two) times daily.   furosemide 20 MG tablet Commonly known as:  LASIX Take 10 mg by mouth 2 (two) times daily.   HYDROcodone-acetaminophen 5-325 MG tablet Commonly known as:  NORCO/VICODIN Take 1-2 tablets by mouth every 4 (four) hours as needed for moderate pain.   levothyroxine 25 MCG tablet Commonly known as:  SYNTHROID, LEVOTHROID Take 1 tablet (25 mcg total) by mouth daily before breakfast.   rosuvastatin 5 MG tablet Commonly known as:  CRESTOR Take 1 tablet (5 mg total) by mouth daily at 6 PM.   warfarin 3 MG tablet Commonly known as:  COUMADIN Take 1 tablet (3 mg total) by mouth daily. What changed:  medication strength  how much to take  when to take this         Diet and Activity recommendation: See Discharge Instructions above   Consults obtained - cardio   Major procedures and Radiology Reports - PLEASE review detailed and final reports for all details, in brief -      Dg Chest 2 View  Result Date: 09/19/2016 CLINICAL DATA:  Generalized body pain. No current complaints. History of atrial fibrillation, coronary artery disease, previous CVA, COPD. EXAM: CHEST  2 VIEW COMPARISON:  Chest x-ray of July 20, 2016 FINDINGS: The lungs are mildly hyperinflated but clear. The heart is mildly enlarged but stable. The pulmonary vascularity is normal. There is calcification in the wall of the aortic arch. The ICD is in stable position. There is a stent in the upper abdominal aorta. IMPRESSION: No active cardiopulmonary disease. Electronically Signed   By: David  Martinique M.D.   On: 09/19/2016 14:04   Ct Cervical Spine Wo Contrast  Result Date: 09/19/2016 CLINICAL DATA:  Radicular upper extremity pain EXAM: CT CERVICAL SPINE WITHOUT CONTRAST TECHNIQUE: Multidetector CT imaging of the cervical spine was performed without  intravenous contrast. Multiplanar CT image reconstructions were also generated. COMPARISON:  None. FINDINGS: Alignment: No static subluxation. Skull base and vertebrae: There is anterior cervical fusion from C3-C6 and posterior fusion from C3-C7. No hardware abnormality. Soft tissues and spinal canal: The spinal canal is severely obscured by streak artifact from spinal hardware. There is no epidural abnormality visualized cyst at the visible levels. Disc levels: There is moderate bony foraminal stenosis at the right C7-T1 foramen. The other visible neural foramina are patent. There is no bony spinal canal stenosis. Upper chest: Biapical emphysema. Other: None IMPRESSION: 1. No acute abnormality of cervical spine. 2. Anterior C3-C6 and posterior C3-C7 spinal fusion without focal hardware abnormality. 3. Moderate right C7-T1 foraminal stenosis. Assessment of the spinal canal and neural foramina is otherwise limited by streak artifact from spinal hardware, but no other stenosis is visualized. 4.  Emphysema (ICD10-J43.9). Electronically Signed   By: Ulyses Jarred M.D.   On: 09/19/2016 14:05   Dg Shoulder Left  Result Date: 09/20/2016 CLINICAL DATA:  Left shoulder pain  with limited range of motion for a week or 2. Patient denies injury. Initial encounter. EXAM: LEFT SHOULDER - 2+ VIEW COMPARISON:  Chest radiographs 07/20/2016 and 09/19/2016. Left shoulder radiographs 02/06/2014 unavailable. FINDINGS: Posttraumatic deformity of the proximal left humerus is noted with sclerosis and mild apex anterior angulation. There is a small well-circumscribed cortical lucency. These findings appear unchanged from recent chest radiographs. No evidence of acute fracture, dislocation or aggressive lesion. There are mild acromioclavicular and glenohumeral degenerative changes. Left subclavian pacemaker noted. IMPRESSION: No acute findings. Stable posttraumatic deformity of the proximal left humerus. Electronically Signed   By: Richardean Sale M.D.   On: 09/20/2016 14:41    Micro Results     No results found for this or any previous visit (from the past 240 hour(s)).     Today   Subjective:   Jairus Tonne today Has no shoulder pain, no chest pain, shortness of breath, no leg edema.  Objective:   Blood pressure (!) 144/70, pulse 69, temperature 98 F (36.7 C), temperature source Oral, resp. rate 16, height 6\' 5"  (1.956 m), weight 78.2 kg (172 lb 8 oz), SpO2 97 %.   Intake/Output Summary (Last 24 hours) at 09/21/16 1207 Last data filed at 09/21/16 1017  Gross per 24 hour  Intake            497.5 ml  Output                0 ml  Net            497.5 ml    Exam Awake Alert, Oriented x 3, No new F.N deficits, Normal affect [place].AT,PERRAL Supple Neck,No JVD, No cervical lymphadenopathy appriciated.  Symmetrical Chest wall movement, Good air movement bilaterally, CTAB RRR,No Gallops,Rubs or new Murmurs, No Parasternal Heave +ve B.Sounds, Abd Soft, Non tender, No organomegaly appriciated, No rebound -guarding or rigidity. No Cyanosis, Clubbing or edema, No new Rash or bruise  Data Review   CBC w Diff: Lab Results  Component Value Date   WBC 7.9 09/20/2016   HGB 9.2 (L) 09/20/2016   HGB 10.4 (L) 12/05/2013   HCT 27.2 (L) 09/20/2016   HCT 31.8 (L) 12/05/2013   PLT 136 (L) 09/20/2016   PLT 140 (L) 12/05/2013   LYMPHOPCT 6 09/19/2016   LYMPHOPCT 23.6 03/29/2013   MONOPCT 10 09/19/2016   MONOPCT 15.3 03/29/2013   EOSPCT 2 09/19/2016   EOSPCT 0.5 03/29/2013   BASOPCT 0 09/19/2016   BASOPCT 0.7 03/29/2013    CMP: Lab Results  Component Value Date   NA 142 09/21/2016   NA 139 12/05/2013   K 4.1 09/21/2016   K 4.2 12/05/2013   CL 111 09/21/2016   CL 109 (H) 12/05/2013   CO2 26 09/21/2016   CO2 25 12/05/2013   BUN 56 (H) 09/21/2016   BUN 32 (H) 12/05/2013   CREATININE 4.04 (H) 09/21/2016   CREATININE 2.13 (H) 12/05/2013   PROT 7.4 09/19/2016   PROT 6.9 12/05/2013   ALBUMIN 3.5 09/19/2016    ALBUMIN 3.2 (L) 12/05/2013   BILITOT 0.5 09/19/2016   BILITOT 1.0 12/05/2013   ALKPHOS 64 09/19/2016   ALKPHOS 69 12/05/2013   AST 20 09/19/2016   AST 24 12/05/2013   ALT 9 (L) 09/19/2016   ALT 16 12/05/2013  .   Total Time in preparing paper work, data evaluation and todays exam - 48 minutes  Zoii Florer M.D on 09/21/2016 at 12:07 PM    Note: This dictation was prepared  with Dragon dictation along with smaller phrase technology. Any transcriptional errors that result from this process are unintentional.

## 2016-09-21 NOTE — Progress Notes (Signed)
   09/21/16 1345  Clinical Encounter Type  Visited With Patient;Health care provider;Family;Patient and family together  Visit Type Follow-up  Referral From Nurse   [facility] responded to request from nurse to follow up with pt. Nurse stated pt would like to complete AD before being discharged. Pt's son has copy of AD he will bring with him when he arrives back at hospital. [facility] explained to pt that if we are unable to complete before his discharge that he can have complete and bring back to hospital anytime. Nurse will page [facility] if needed to follow up.

## 2016-09-21 NOTE — Care Management (Signed)
Discharge to home today per Dr. Vianne Bulls. Will update [facility]. Family will transport. Shelbie Ammons RN MSN CCM Care Management [phone]

## 2016-09-22 DIAGNOSIS — M791 Myalgia: Secondary | ICD-10-CM | POA: Diagnosis not present

## 2016-09-22 DIAGNOSIS — I69354 Hemiplegia and hemiparesis following cerebral infarction affecting left non-dominant side: Secondary | ICD-10-CM | POA: Diagnosis not present

## 2016-09-22 DIAGNOSIS — M5106 Intervertebral disc disorders with myelopathy, lumbar region: Secondary | ICD-10-CM | POA: Diagnosis not present

## 2016-09-22 DIAGNOSIS — N184 Chronic kidney disease, stage 4 (severe): Secondary | ICD-10-CM | POA: Diagnosis not present

## 2016-09-22 DIAGNOSIS — I129 Hypertensive chronic kidney disease with stage 1 through stage 4 chronic kidney disease, or unspecified chronic kidney disease: Secondary | ICD-10-CM | POA: Diagnosis not present

## 2016-09-22 DIAGNOSIS — R079 Chest pain, unspecified: Secondary | ICD-10-CM | POA: Diagnosis not present

## 2016-09-22 DIAGNOSIS — I10 Essential (primary) hypertension: Secondary | ICD-10-CM | POA: Diagnosis not present

## 2016-09-23 DIAGNOSIS — I10 Essential (primary) hypertension: Secondary | ICD-10-CM | POA: Diagnosis not present

## 2016-09-24 DIAGNOSIS — R079 Chest pain, unspecified: Secondary | ICD-10-CM | POA: Diagnosis not present

## 2016-09-24 DIAGNOSIS — I129 Hypertensive chronic kidney disease with stage 1 through stage 4 chronic kidney disease, or unspecified chronic kidney disease: Secondary | ICD-10-CM | POA: Diagnosis not present

## 2016-09-24 DIAGNOSIS — N184 Chronic kidney disease, stage 4 (severe): Secondary | ICD-10-CM | POA: Diagnosis not present

## 2016-09-24 DIAGNOSIS — M791 Myalgia: Secondary | ICD-10-CM | POA: Diagnosis not present

## 2016-09-24 DIAGNOSIS — M5106 Intervertebral disc disorders with myelopathy, lumbar region: Secondary | ICD-10-CM | POA: Diagnosis not present

## 2016-09-24 DIAGNOSIS — I69354 Hemiplegia and hemiparesis following cerebral infarction affecting left non-dominant side: Secondary | ICD-10-CM | POA: Diagnosis not present

## 2016-09-26 DIAGNOSIS — I69354 Hemiplegia and hemiparesis following cerebral infarction affecting left non-dominant side: Secondary | ICD-10-CM | POA: Diagnosis not present

## 2016-09-26 DIAGNOSIS — Z5181 Encounter for therapeutic drug level monitoring: Secondary | ICD-10-CM | POA: Diagnosis not present

## 2016-09-26 DIAGNOSIS — M791 Myalgia: Secondary | ICD-10-CM | POA: Diagnosis not present

## 2016-09-26 DIAGNOSIS — S020XXA Fracture of vault of skull, initial encounter for closed fracture: Secondary | ICD-10-CM | POA: Diagnosis not present

## 2016-09-26 DIAGNOSIS — I129 Hypertensive chronic kidney disease with stage 1 through stage 4 chronic kidney disease, or unspecified chronic kidney disease: Secondary | ICD-10-CM | POA: Diagnosis not present

## 2016-09-26 DIAGNOSIS — N184 Chronic kidney disease, stage 4 (severe): Secondary | ICD-10-CM | POA: Diagnosis not present

## 2016-09-26 DIAGNOSIS — M5106 Intervertebral disc disorders with myelopathy, lumbar region: Secondary | ICD-10-CM | POA: Diagnosis not present

## 2016-09-26 DIAGNOSIS — R079 Chest pain, unspecified: Secondary | ICD-10-CM | POA: Diagnosis not present

## 2016-09-26 DIAGNOSIS — I10 Essential (primary) hypertension: Secondary | ICD-10-CM | POA: Diagnosis not present

## 2016-09-26 DIAGNOSIS — R32 Unspecified urinary incontinence: Secondary | ICD-10-CM | POA: Diagnosis not present

## 2016-09-27 DIAGNOSIS — I10 Essential (primary) hypertension: Secondary | ICD-10-CM | POA: Diagnosis not present

## 2016-09-29 DIAGNOSIS — M791 Myalgia: Secondary | ICD-10-CM | POA: Diagnosis not present

## 2016-09-29 DIAGNOSIS — R531 Weakness: Secondary | ICD-10-CM | POA: Diagnosis not present

## 2016-09-29 DIAGNOSIS — I69354 Hemiplegia and hemiparesis following cerebral infarction affecting left non-dominant side: Secondary | ICD-10-CM | POA: Diagnosis not present

## 2016-09-29 DIAGNOSIS — M5106 Intervertebral disc disorders with myelopathy, lumbar region: Secondary | ICD-10-CM | POA: Diagnosis not present

## 2016-09-29 DIAGNOSIS — N184 Chronic kidney disease, stage 4 (severe): Secondary | ICD-10-CM | POA: Diagnosis not present

## 2016-09-29 DIAGNOSIS — I129 Hypertensive chronic kidney disease with stage 1 through stage 4 chronic kidney disease, or unspecified chronic kidney disease: Secondary | ICD-10-CM | POA: Diagnosis not present

## 2016-09-29 DIAGNOSIS — J42 Unspecified chronic bronchitis: Secondary | ICD-10-CM | POA: Diagnosis not present

## 2016-09-29 DIAGNOSIS — I10 Essential (primary) hypertension: Secondary | ICD-10-CM | POA: Diagnosis not present

## 2016-09-29 DIAGNOSIS — I482 Chronic atrial fibrillation: Secondary | ICD-10-CM | POA: Diagnosis not present

## 2016-09-29 DIAGNOSIS — R079 Chest pain, unspecified: Secondary | ICD-10-CM | POA: Diagnosis not present

## 2016-09-29 DIAGNOSIS — Z7901 Long term (current) use of anticoagulants: Secondary | ICD-10-CM | POA: Diagnosis not present

## 2016-09-29 DIAGNOSIS — Z09 Encounter for follow-up examination after completed treatment for conditions other than malignant neoplasm: Secondary | ICD-10-CM | POA: Diagnosis not present

## 2016-09-30 DIAGNOSIS — I10 Essential (primary) hypertension: Secondary | ICD-10-CM | POA: Diagnosis not present

## 2016-09-30 DIAGNOSIS — N184 Chronic kidney disease, stage 4 (severe): Secondary | ICD-10-CM | POA: Diagnosis not present

## 2016-09-30 DIAGNOSIS — M5106 Intervertebral disc disorders with myelopathy, lumbar region: Secondary | ICD-10-CM | POA: Diagnosis not present

## 2016-09-30 DIAGNOSIS — R079 Chest pain, unspecified: Secondary | ICD-10-CM | POA: Diagnosis not present

## 2016-09-30 DIAGNOSIS — I69354 Hemiplegia and hemiparesis following cerebral infarction affecting left non-dominant side: Secondary | ICD-10-CM | POA: Diagnosis not present

## 2016-09-30 DIAGNOSIS — I129 Hypertensive chronic kidney disease with stage 1 through stage 4 chronic kidney disease, or unspecified chronic kidney disease: Secondary | ICD-10-CM | POA: Diagnosis not present

## 2016-09-30 DIAGNOSIS — M791 Myalgia: Secondary | ICD-10-CM | POA: Diagnosis not present

## 2016-10-01 DIAGNOSIS — I10 Essential (primary) hypertension: Secondary | ICD-10-CM | POA: Diagnosis not present

## 2016-10-03 DIAGNOSIS — M5106 Intervertebral disc disorders with myelopathy, lumbar region: Secondary | ICD-10-CM | POA: Diagnosis not present

## 2016-10-03 DIAGNOSIS — N184 Chronic kidney disease, stage 4 (severe): Secondary | ICD-10-CM | POA: Diagnosis not present

## 2016-10-03 DIAGNOSIS — I69354 Hemiplegia and hemiparesis following cerebral infarction affecting left non-dominant side: Secondary | ICD-10-CM | POA: Diagnosis not present

## 2016-10-03 DIAGNOSIS — M791 Myalgia: Secondary | ICD-10-CM | POA: Diagnosis not present

## 2016-10-03 DIAGNOSIS — R079 Chest pain, unspecified: Secondary | ICD-10-CM | POA: Diagnosis not present

## 2016-10-03 DIAGNOSIS — I10 Essential (primary) hypertension: Secondary | ICD-10-CM | POA: Diagnosis not present

## 2016-10-03 DIAGNOSIS — I129 Hypertensive chronic kidney disease with stage 1 through stage 4 chronic kidney disease, or unspecified chronic kidney disease: Secondary | ICD-10-CM | POA: Diagnosis not present

## 2016-10-04 DIAGNOSIS — I10 Essential (primary) hypertension: Secondary | ICD-10-CM | POA: Diagnosis not present

## 2016-10-05 DIAGNOSIS — R079 Chest pain, unspecified: Secondary | ICD-10-CM | POA: Diagnosis not present

## 2016-10-05 DIAGNOSIS — N184 Chronic kidney disease, stage 4 (severe): Secondary | ICD-10-CM | POA: Diagnosis not present

## 2016-10-05 DIAGNOSIS — M791 Myalgia: Secondary | ICD-10-CM | POA: Diagnosis not present

## 2016-10-05 DIAGNOSIS — I129 Hypertensive chronic kidney disease with stage 1 through stage 4 chronic kidney disease, or unspecified chronic kidney disease: Secondary | ICD-10-CM | POA: Diagnosis not present

## 2016-10-05 DIAGNOSIS — I69354 Hemiplegia and hemiparesis following cerebral infarction affecting left non-dominant side: Secondary | ICD-10-CM | POA: Diagnosis not present

## 2016-10-05 DIAGNOSIS — I10 Essential (primary) hypertension: Secondary | ICD-10-CM | POA: Diagnosis not present

## 2016-10-05 DIAGNOSIS — M5106 Intervertebral disc disorders with myelopathy, lumbar region: Secondary | ICD-10-CM | POA: Diagnosis not present

## 2016-10-06 DIAGNOSIS — M5106 Intervertebral disc disorders with myelopathy, lumbar region: Secondary | ICD-10-CM | POA: Diagnosis not present

## 2016-10-06 DIAGNOSIS — I10 Essential (primary) hypertension: Secondary | ICD-10-CM | POA: Diagnosis not present

## 2016-10-06 DIAGNOSIS — I129 Hypertensive chronic kidney disease with stage 1 through stage 4 chronic kidney disease, or unspecified chronic kidney disease: Secondary | ICD-10-CM | POA: Diagnosis not present

## 2016-10-06 DIAGNOSIS — R079 Chest pain, unspecified: Secondary | ICD-10-CM | POA: Diagnosis not present

## 2016-10-06 DIAGNOSIS — N184 Chronic kidney disease, stage 4 (severe): Secondary | ICD-10-CM | POA: Diagnosis not present

## 2016-10-06 DIAGNOSIS — M791 Myalgia: Secondary | ICD-10-CM | POA: Diagnosis not present

## 2016-10-07 DIAGNOSIS — I10 Essential (primary) hypertension: Secondary | ICD-10-CM | POA: Diagnosis not present

## 2016-10-10 DIAGNOSIS — M791 Myalgia: Secondary | ICD-10-CM | POA: Diagnosis not present

## 2016-10-10 DIAGNOSIS — I10 Essential (primary) hypertension: Secondary | ICD-10-CM | POA: Diagnosis not present

## 2016-10-10 DIAGNOSIS — I69354 Hemiplegia and hemiparesis following cerebral infarction affecting left non-dominant side: Secondary | ICD-10-CM | POA: Diagnosis not present

## 2016-10-10 DIAGNOSIS — I129 Hypertensive chronic kidney disease with stage 1 through stage 4 chronic kidney disease, or unspecified chronic kidney disease: Secondary | ICD-10-CM | POA: Diagnosis not present

## 2016-10-10 DIAGNOSIS — R079 Chest pain, unspecified: Secondary | ICD-10-CM | POA: Diagnosis not present

## 2016-10-10 DIAGNOSIS — J449 Chronic obstructive pulmonary disease, unspecified: Secondary | ICD-10-CM | POA: Diagnosis not present

## 2016-10-10 DIAGNOSIS — M5106 Intervertebral disc disorders with myelopathy, lumbar region: Secondary | ICD-10-CM | POA: Diagnosis not present

## 2016-10-10 DIAGNOSIS — N184 Chronic kidney disease, stage 4 (severe): Secondary | ICD-10-CM | POA: Diagnosis not present

## 2016-10-11 DIAGNOSIS — I10 Essential (primary) hypertension: Secondary | ICD-10-CM | POA: Diagnosis not present

## 2016-10-12 DIAGNOSIS — I10 Essential (primary) hypertension: Secondary | ICD-10-CM | POA: Diagnosis not present

## 2016-10-13 DIAGNOSIS — I10 Essential (primary) hypertension: Secondary | ICD-10-CM | POA: Diagnosis not present

## 2016-10-14 DIAGNOSIS — I10 Essential (primary) hypertension: Secondary | ICD-10-CM | POA: Diagnosis not present

## 2016-10-17 DIAGNOSIS — D631 Anemia in chronic kidney disease: Secondary | ICD-10-CM | POA: Diagnosis not present

## 2016-10-17 DIAGNOSIS — N184 Chronic kidney disease, stage 4 (severe): Secondary | ICD-10-CM | POA: Diagnosis not present

## 2016-10-17 DIAGNOSIS — I10 Essential (primary) hypertension: Secondary | ICD-10-CM | POA: Diagnosis not present

## 2016-10-18 DIAGNOSIS — I10 Essential (primary) hypertension: Secondary | ICD-10-CM | POA: Diagnosis not present

## 2016-10-19 DIAGNOSIS — I10 Essential (primary) hypertension: Secondary | ICD-10-CM | POA: Diagnosis not present

## 2016-10-20 DIAGNOSIS — I10 Essential (primary) hypertension: Secondary | ICD-10-CM | POA: Diagnosis not present

## 2016-10-21 DIAGNOSIS — R079 Chest pain, unspecified: Secondary | ICD-10-CM | POA: Diagnosis not present

## 2016-10-21 DIAGNOSIS — I10 Essential (primary) hypertension: Secondary | ICD-10-CM | POA: Diagnosis not present

## 2016-10-21 DIAGNOSIS — I129 Hypertensive chronic kidney disease with stage 1 through stage 4 chronic kidney disease, or unspecified chronic kidney disease: Secondary | ICD-10-CM | POA: Diagnosis not present

## 2016-10-21 DIAGNOSIS — I69354 Hemiplegia and hemiparesis following cerebral infarction affecting left non-dominant side: Secondary | ICD-10-CM | POA: Diagnosis not present

## 2016-10-21 DIAGNOSIS — M791 Myalgia: Secondary | ICD-10-CM | POA: Diagnosis not present

## 2016-10-21 DIAGNOSIS — N184 Chronic kidney disease, stage 4 (severe): Secondary | ICD-10-CM | POA: Diagnosis not present

## 2016-10-21 DIAGNOSIS — M5106 Intervertebral disc disorders with myelopathy, lumbar region: Secondary | ICD-10-CM | POA: Diagnosis not present

## 2016-10-24 DIAGNOSIS — I10 Essential (primary) hypertension: Secondary | ICD-10-CM | POA: Diagnosis not present

## 2016-10-25 DIAGNOSIS — I10 Essential (primary) hypertension: Secondary | ICD-10-CM | POA: Diagnosis not present

## 2016-10-26 DIAGNOSIS — I10 Essential (primary) hypertension: Secondary | ICD-10-CM | POA: Diagnosis not present

## 2016-10-27 DIAGNOSIS — I482 Chronic atrial fibrillation: Secondary | ICD-10-CM | POA: Diagnosis not present

## 2016-10-27 DIAGNOSIS — E7800 Pure hypercholesterolemia, unspecified: Secondary | ICD-10-CM | POA: Diagnosis not present

## 2016-10-27 DIAGNOSIS — M791 Myalgia: Secondary | ICD-10-CM | POA: Diagnosis not present

## 2016-10-27 DIAGNOSIS — I69354 Hemiplegia and hemiparesis following cerebral infarction affecting left non-dominant side: Secondary | ICD-10-CM | POA: Diagnosis not present

## 2016-10-27 DIAGNOSIS — N184 Chronic kidney disease, stage 4 (severe): Secondary | ICD-10-CM | POA: Diagnosis not present

## 2016-10-27 DIAGNOSIS — R079 Chest pain, unspecified: Secondary | ICD-10-CM | POA: Diagnosis not present

## 2016-10-27 DIAGNOSIS — R32 Unspecified urinary incontinence: Secondary | ICD-10-CM | POA: Diagnosis not present

## 2016-10-27 DIAGNOSIS — I10 Essential (primary) hypertension: Secondary | ICD-10-CM | POA: Diagnosis not present

## 2016-10-27 DIAGNOSIS — I495 Sick sinus syndrome: Secondary | ICD-10-CM | POA: Diagnosis not present

## 2016-10-27 DIAGNOSIS — I129 Hypertensive chronic kidney disease with stage 1 through stage 4 chronic kidney disease, or unspecified chronic kidney disease: Secondary | ICD-10-CM | POA: Diagnosis not present

## 2016-10-27 DIAGNOSIS — M5106 Intervertebral disc disorders with myelopathy, lumbar region: Secondary | ICD-10-CM | POA: Diagnosis not present

## 2016-10-27 DIAGNOSIS — R55 Syncope and collapse: Secondary | ICD-10-CM | POA: Diagnosis not present

## 2016-10-27 DIAGNOSIS — S020XXA Fracture of vault of skull, initial encounter for closed fracture: Secondary | ICD-10-CM | POA: Diagnosis not present

## 2016-10-28 DIAGNOSIS — I10 Essential (primary) hypertension: Secondary | ICD-10-CM | POA: Diagnosis not present

## 2016-10-31 DIAGNOSIS — I10 Essential (primary) hypertension: Secondary | ICD-10-CM | POA: Diagnosis not present

## 2016-11-01 DIAGNOSIS — I10 Essential (primary) hypertension: Secondary | ICD-10-CM | POA: Diagnosis not present

## 2016-11-02 DIAGNOSIS — I10 Essential (primary) hypertension: Secondary | ICD-10-CM | POA: Diagnosis not present

## 2016-11-03 DIAGNOSIS — I10 Essential (primary) hypertension: Secondary | ICD-10-CM | POA: Diagnosis not present

## 2016-11-04 DIAGNOSIS — I10 Essential (primary) hypertension: Secondary | ICD-10-CM | POA: Diagnosis not present

## 2016-11-07 DIAGNOSIS — I10 Essential (primary) hypertension: Secondary | ICD-10-CM | POA: Diagnosis not present

## 2016-11-08 DIAGNOSIS — I10 Essential (primary) hypertension: Secondary | ICD-10-CM | POA: Diagnosis not present

## 2016-11-09 DIAGNOSIS — I10 Essential (primary) hypertension: Secondary | ICD-10-CM | POA: Diagnosis not present

## 2016-11-10 DIAGNOSIS — I10 Essential (primary) hypertension: Secondary | ICD-10-CM | POA: Diagnosis not present

## 2016-11-11 DIAGNOSIS — I10 Essential (primary) hypertension: Secondary | ICD-10-CM | POA: Diagnosis not present

## 2016-11-14 DIAGNOSIS — I10 Essential (primary) hypertension: Secondary | ICD-10-CM | POA: Diagnosis not present

## 2016-11-15 DIAGNOSIS — I10 Essential (primary) hypertension: Secondary | ICD-10-CM | POA: Diagnosis not present

## 2016-11-16 DIAGNOSIS — I10 Essential (primary) hypertension: Secondary | ICD-10-CM | POA: Diagnosis not present

## 2016-11-17 DIAGNOSIS — I10 Essential (primary) hypertension: Secondary | ICD-10-CM | POA: Diagnosis not present

## 2016-11-18 DIAGNOSIS — I10 Essential (primary) hypertension: Secondary | ICD-10-CM | POA: Diagnosis not present

## 2016-11-21 DIAGNOSIS — I10 Essential (primary) hypertension: Secondary | ICD-10-CM | POA: Diagnosis not present

## 2016-11-22 DIAGNOSIS — I10 Essential (primary) hypertension: Secondary | ICD-10-CM | POA: Diagnosis not present

## 2016-11-23 DIAGNOSIS — I10 Essential (primary) hypertension: Secondary | ICD-10-CM | POA: Diagnosis not present

## 2016-11-24 DIAGNOSIS — I10 Essential (primary) hypertension: Secondary | ICD-10-CM | POA: Diagnosis not present

## 2016-11-25 ENCOUNTER — Other Ambulatory Visit (INDEPENDENT_AMBULATORY_CARE_PROVIDER_SITE_OTHER): Payer: Self-pay | Admitting: Vascular Surgery

## 2016-11-25 ENCOUNTER — Ambulatory Visit (INDEPENDENT_AMBULATORY_CARE_PROVIDER_SITE_OTHER): Payer: Medicare HMO

## 2016-11-25 ENCOUNTER — Ambulatory Visit (INDEPENDENT_AMBULATORY_CARE_PROVIDER_SITE_OTHER): Payer: Medicare HMO | Admitting: Vascular Surgery

## 2016-11-25 ENCOUNTER — Encounter (INDEPENDENT_AMBULATORY_CARE_PROVIDER_SITE_OTHER): Payer: Self-pay | Admitting: Vascular Surgery

## 2016-11-25 ENCOUNTER — Other Ambulatory Visit (INDEPENDENT_AMBULATORY_CARE_PROVIDER_SITE_OTHER): Payer: Medicare HMO

## 2016-11-25 VITALS — BP 161/91 | HR 58 | Resp 16

## 2016-11-25 DIAGNOSIS — I10 Essential (primary) hypertension: Secondary | ICD-10-CM

## 2016-11-25 DIAGNOSIS — I6523 Occlusion and stenosis of bilateral carotid arteries: Secondary | ICD-10-CM

## 2016-11-25 DIAGNOSIS — Z95828 Presence of other vascular implants and grafts: Secondary | ICD-10-CM

## 2016-11-25 DIAGNOSIS — I739 Peripheral vascular disease, unspecified: Secondary | ICD-10-CM

## 2016-11-25 DIAGNOSIS — I714 Abdominal aortic aneurysm, without rupture, unspecified: Secondary | ICD-10-CM | POA: Insufficient documentation

## 2016-11-25 DIAGNOSIS — I7090 Unspecified atherosclerosis: Secondary | ICD-10-CM

## 2016-11-25 DIAGNOSIS — I495 Sick sinus syndrome: Secondary | ICD-10-CM

## 2016-11-25 NOTE — Assessment & Plan Note (Signed)
Mild atherosclerosis with 1-39% stenosis present. Plan to recheck in 2 years

## 2016-11-25 NOTE — Assessment & Plan Note (Signed)
Now with a pacer. °

## 2016-11-25 NOTE — Assessment & Plan Note (Signed)
perfusion remains preserved and peripheral aneurysms remained stable. No intervention for his multiple areas of stenosis without significant symptoms. Recheck in 1 year

## 2016-11-25 NOTE — Assessment & Plan Note (Signed)
Stable at 3.3 cm. Plan to recheck in 1 year.

## 2016-11-25 NOTE — Progress Notes (Signed)
MRN : [id number]  Adis Sturgill is a 83 y.o. (1931-10-29) male who presents with chief complaint of  Chief Complaint  Patient presents with  . Follow-up    1 yr [place],EVAR,Carotid  .  History of Present Illness: Patient returns today in follow up of multiple vascular issues.  His overall functional status has continued to decline and he looks quite frail today. He has not had any focal neurologic symptoms, nonhealing ulcerations of the lower extremity, or aneurysm  Related symptoms. His carotid duplex today shows stable, mild less than 40% carotid artery stenosis bilaterally. His lower extremity duplex shows multiple moderate stenoses throughout the SFA, popliteal, and tibial arteries. His ABIs are preserved at 1.14 on the right and 0.99 on the left. His abdominal aortic aneurysm is stable at 3.3 cm.  Current Outpatient Prescriptions  Medication Sig Dispense Refill  . albuterol (PROVENTIL HFA;VENTOLIN HFA) 108 (90 Base) MCG/ACT inhaler Inhale 2 puffs into the lungs every 6 (six) hours as needed for wheezing or shortness of breath.    Marland Kitchen amLODipine (NORVASC) 5 MG tablet Take 1 tablet (5 mg total) by mouth daily. 30 tablet 0  . cyanocobalamin 500 MCG tablet Take 500 mcg by mouth 2 (two) times daily.    Marland Kitchen levothyroxine (SYNTHROID, LEVOTHROID) 25 MCG tablet Take 1 tablet (25 mcg total) by mouth daily before breakfast. 30 tablet 0  . rosuvastatin (CRESTOR) 5 MG tablet Take 1 tablet (5 mg total) by mouth daily at 6 PM. 30 tablet   . warfarin (COUMADIN) 3 MG tablet Take 1 tablet (3 mg total) by mouth daily. 30 tablet 2  . HYDROcodone-acetaminophen (NORCO/VICODIN) 5-325 MG tablet Take 1-2 tablets by mouth every 4 (four) hours as needed for moderate pain. (Patient not taking: Reported on 11/25/2016) 30 tablet 0   No current facility-administered medications for this visit.     Past Medical History:  Diagnosis Date  . Anemia   . Atrial fibrillation ([place])   . Bradycardia   . COPD (chronic  obstructive pulmonary disease) ([place])   . Coronary artery disease   . Depression   . Heart murmur   . HLD (hyperlipidemia)   . Hypertension   . Hypothyroidism   . Lumbar myelopathy ([place])   . MDS (myelodysplastic syndrome) ([place])   . OSA (obstructive sleep apnea)   . Stroke [facility])    left sided weakness  . Syncope     Past Surgical History:  Procedure Laterality Date  . ABDOMINAL AORTIC ANEURYSM REPAIR    . BACK SURGERY    . CARDIAC SURGERY     mitral valve surgery  . CARDIAC VALVE REPLACEMENT    . NECK SURGERY    . PACEMAKER INSERTION Left 07/20/2016   Procedure: INSERTION PACEMAKER;  Surgeon: Isaias Cowman, MD;  Location: [facility] ORS;  Service: Cardiovascular;  Laterality: Left;    Social History Social History  Substance Use Topics  . Smoking status: Former Research scientist (life sciences)  . Smokeless tobacco: Former Systems developer  . Alcohol use No    Family History Family History  Problem Relation Age of Onset  . Hypertension Mother   . Congestive Heart Failure Father   . Hypertension Father     No Known Allergies   REVIEW OF SYSTEMS (Negative unless checked)  Constitutional: '[]'$ Weight loss  '[]'$ Fever  '[]'$ Chills Cardiac: '[]'$ Chest pain   '[]'$ Chest pressure   '[x]'$ Palpitations   '[]'$ Shortness of breath when laying flat   '[]'$ Shortness of breath at rest   '[]'$ Shortness of breath with  exertion. Vascular:  '[]'$ Pain in legs with walking   '[]'$ Pain in legs at rest   '[]'$ Pain in legs when laying flat   '[]'$ Claudication   '[]'$ Pain in feet when walking  '[]'$ Pain in feet at rest  '[]'$ Pain in feet when laying flat   '[]'$ History of DVT   '[]'$ Phlebitis   '[]'$ Swelling in legs   '[]'$ Varicose veins   '[]'$ Non-healing ulcers Pulmonary:   '[]'$ Uses home oxygen   '[]'$ Productive cough   '[]'$ Hemoptysis   '[]'$ Wheeze  '[]'$ COPD   '[]'$ Asthma Neurologic:  '[]'$ Dizziness  '[]'$ Blackouts   '[]'$ Seizures   '[x]'$ History of stroke   '[]'$ History of TIA  '[]'$ Aphasia   '[]'$ Temporary blindness   '[]'$ Dysphagia   '[x]'$ Weakness or numbness in arms   '[x]'$ Weakness or numbness in legs Musculoskeletal:   '[x]'$ Arthritis   '[]'$ Joint swelling   '[]'$ Joint pain   '[]'$ Low back pain Hematologic:  '[]'$ Easy bruising  '[]'$ Easy bleeding   '[]'$ Hypercoagulable state   '[]'$ Anemic   Gastrointestinal:  '[]'$ Blood in stool   '[]'$ Vomiting blood  '[]'$ Gastroesophageal reflux/heartburn   '[]'$ Abdominal pain Genitourinary:  '[]'$ Chronic kidney disease   '[]'$ Difficult urination  '[]'$ Frequent urination  '[]'$ Burning with urination   '[]'$ Hematuria Skin:  '[]'$ Rashes   '[]'$ Ulcers   '[]'$ Wounds Psychological:  '[]'$ History of anxiety   '[]'$  History of major depression.  Physical Examination  BP (!) 161/91   Pulse (!) 58   Resp 16  Gen:  WD/WN, NAD Head: [place]/AT, No temporalis wasting. Ear/Nose/Throat: Hearing grossly intact, nares w/o erythema or drainage, trachea midline Eyes: Conjunctiva clear. Sclera non-icteric Neck: Supple.  No JVD.  Pulmonary:  Good air movement, no use of accessory muscles.  Cardiac: RRR, normal S1, S2 Vascular:  Vessel Right Left  Radial Palpable Palpable                          PT Palpable Palpable  DP Palpable Palpable    Musculoskeletal: diffuse weakness throughout all 4 extremities. Beginning to get contractures. no edema. Neurologic: Sensation grossly intact in extremities.  Symmetrical.   Psychiatric: Judgment intact, Mood & affect appropriate for pt's clinical situation. Dermatologic: No rashes or ulcers noted.  No cellulitis or open wounds.       Labs Recent Results (from the past 2160 hour(s))  Comprehensive metabolic panel     Status: Abnormal   Collection Time: 09/19/16  1:22 PM  Result Value Ref Range   Sodium 141 135 - 145 mmol/L   Potassium 3.9 3.5 - 5.1 mmol/L   Chloride 107 101 - 111 mmol/L   CO2 25 22 - 32 mmol/L   Glucose, Bld 107 (H) 65 - 99 mg/dL   BUN 54 (H) 6 - 20 mg/dL   Creatinine, Ser 3.82 (H) 0.61 - 1.24 mg/dL   Calcium 9.1 8.9 - 10.3 mg/dL   Total Protein 7.4 6.5 - 8.1 g/dL   Albumin 3.5 3.5 - 5.0 g/dL   AST 20 15 - 41 U/L   ALT 9 (L) 17 - 63 U/L   Alkaline Phosphatase 64 38 - 126 U/L    Total Bilirubin 0.5 0.3 - 1.2 mg/dL   GFR calc non Af Amer 13 (L) >60 mL/min   GFR calc Af Amer 15 (L) >60 mL/min    Comment: (NOTE) The eGFR has been calculated using the CKD EPI equation. This calculation has not been validated in all clinical situations. eGFR's persistently <60 mL/min signify possible Chronic Kidney Disease.    Anion gap 9 5 - 15  CBC with Differential  Status: Abnormal   Collection Time: 09/19/16  1:22 PM  Result Value Ref Range   WBC 7.6 3.8 - 10.6 K/uL   RBC 3.27 (L) 4.40 - 5.90 MIL/uL   Hemoglobin 9.9 (L) 13.0 - 18.0 g/dL   HCT 29.5 (L) 40.0 - 52.0 %   MCV 90.1 80.0 - 100.0 fL   MCH 30.2 26.0 - 34.0 pg   MCHC 33.5 32.0 - 36.0 g/dL   RDW 14.0 11.5 - 14.5 %   Platelets 149 (L) 150 - 440 K/uL   Neutrophils Relative % 82 %   Neutro Abs 6.2 1.4 - 6.5 K/uL   Lymphocytes Relative 6 %   Lymphs Abs 0.5 (L) 1.0 - 3.6 K/uL   Monocytes Relative 10 %   Monocytes Absolute 0.7 0.2 - 1.0 K/uL   Eosinophils Relative 2 %   Eosinophils Absolute 0.1 0 - 0.7 K/uL   Basophils Relative 0 %   Basophils Absolute 0.0 0 - 0.1 K/uL  Protime-INR     Status: Abnormal   Collection Time: 09/19/16  1:22 PM  Result Value Ref Range   Prothrombin Time 38.4 (H) 11.4 - 15.2 seconds   INR 3.80   Troponin I     Status: Abnormal   Collection Time: 09/19/16  1:22 PM  Result Value Ref Range   Troponin I 0.04 (HH) <0.03 ng/mL    Comment: CRITICAL RESULT CALLED TO, READ BACK BY AND VERIFIED WITH NELLIE MONAR AT 1421 09/19/16 DAS   CK     Status: None   Collection Time: 09/19/16  1:22 PM  Result Value Ref Range   Total CK 154 49 - 397 U/L  Magnesium     Status: None   Collection Time: 09/19/16  1:22 PM  Result Value Ref Range   Magnesium 2.1 1.7 - 2.4 mg/dL  Troponin I     Status: Abnormal   Collection Time: 09/19/16  3:50 PM  Result Value Ref Range   Troponin I 0.05 (HH) <0.03 ng/mL    Comment: CRITICAL VALUE NOTED. VALUE IS CONSISTENT WITH PREVIOUSLY REPORTED/CALLED VALUE [place]   Troponin I     Status: Abnormal   Collection Time: 09/19/16  7:38 PM  Result Value Ref Range   Troponin I 0.05 (HH) <0.03 ng/mL    Comment: CRITICAL VALUE NOTED. VALUE IS CONSISTENT WITH PREVIOUSLY REPORTED/CALLED VALUE [place]  Lipid panel     Status: Abnormal   Collection Time: 09/19/16  7:38 PM  Result Value Ref Range   Cholesterol 209 (H) 0 - 200 mg/dL   Triglycerides 110 <150 mg/dL   HDL 37 (L) >40 mg/dL   Total CHOL/HDL Ratio 5.6 RATIO   VLDL 22 0 - 40 mg/dL   LDL Cholesterol 150 (H) 0 - 99 mg/dL    Comment:        Total Cholesterol/HDL:CHD Risk Coronary Heart Disease Risk Table                     Men   Women  1/2 Average Risk   3.4   3.3  Average Risk       5.0   4.4  2 X Average Risk   9.6   7.1  3 X Average Risk  23.4   11.0        Use the calculated Patient Ratio above and the CHD Risk Table to determine the patient's CHD Risk.        ATP III CLASSIFICATION (LDL):  <100  mg/dL   Optimal  100-129  mg/dL   Near or Above                    Optimal  130-159  mg/dL   Borderline  160-189  mg/dL   High  >190     mg/dL   Very High   Troponin I     Status: Abnormal   Collection Time: 09/20/16  1:04 AM  Result Value Ref Range   Troponin I 0.05 (HH) <0.03 ng/mL    Comment: CRITICAL VALUE NOTED. VALUE IS CONSISTENT WITH PREVIOUSLY REPORTED/CALLED VALUE.MSS  Heparin level (unfractionated)     Status: Abnormal   Collection Time: 09/20/16  4:39 AM  Result Value Ref Range   Heparin Unfractionated 0.26 (L) 0.30 - 0.70 IU/mL    Comment:        IF HEPARIN RESULTS ARE BELOW EXPECTED VALUES, AND PATIENT DOSAGE HAS BEEN CONFIRMED, SUGGEST FOLLOW UP TESTING OF ANTITHROMBIN III LEVELS.   CBC     Status: Abnormal   Collection Time: 09/20/16  4:39 AM  Result Value Ref Range   WBC 7.9 3.8 - 10.6 K/uL   RBC 2.98 (L) 4.40 - 5.90 MIL/uL   Hemoglobin 9.2 (L) 13.0 - 18.0 g/dL   HCT 27.2 (L) 40.0 - 52.0 %   MCV 91.0 80.0 - 100.0 fL   MCH 30.8 26.0 - 34.0 pg   MCHC 33.8 32.0 -  36.0 g/dL   RDW 13.9 11.5 - 14.5 %   Platelets 136 (L) 150 - 440 K/uL  Troponin I     Status: Abnormal   Collection Time: 09/20/16  6:52 AM  Result Value Ref Range   Troponin I 0.04 (HH) <0.03 ng/mL    Comment: CRITICAL VALUE NOTED. VALUE IS CONSISTENT WITH PREVIOUSLY REPORTED/CALLED VALUE ALV  Protime-INR     Status: Abnormal   Collection Time: 09/20/16  6:52 AM  Result Value Ref Range   Prothrombin Time 42.5 (H) 11.4 - 15.2 seconds   INR 4.31 (HH)     Comment: RESULT REPEATED AND VERIFIED CRITICAL RESULT CALLED TO, READ BACK BY AND VERIFIED WITH: TANYA BERRY AT 0801 ON 09/20/16 [place].   Protime-INR     Status: Abnormal   Collection Time: 09/21/16  6:14 AM  Result Value Ref Range   Prothrombin Time 47.1 (H) 11.4 - 15.2 seconds   INR 4.90 (HH)     Comment: CRITICAL RESULT CALLED TO, READ BACK BY AND VERIFIED WITH: TENIA BERRY'@0805'$  ON 09/21/16 BY HKP   Basic metabolic panel     Status: Abnormal   Collection Time: 09/21/16  6:14 AM  Result Value Ref Range   Sodium 142 135 - 145 mmol/L   Potassium 4.1 3.5 - 5.1 mmol/L   Chloride 111 101 - 111 mmol/L   CO2 26 22 - 32 mmol/L   Glucose, Bld 98 65 - 99 mg/dL   BUN 56 (H) 6 - 20 mg/dL   Creatinine, Ser 4.04 (H) 0.61 - 1.24 mg/dL   Calcium 8.4 (L) 8.9 - 10.3 mg/dL   GFR calc non Af Amer 12 (L) >60 mL/min   GFR calc Af Amer 14 (L) >60 mL/min    Comment: (NOTE) The eGFR has been calculated using the CKD EPI equation. This calculation has not been validated in all clinical situations. eGFR's persistently <60 mL/min signify possible Chronic Kidney Disease.    Anion gap 5 5 - 15  VAS US CAROTID     Status: None (In  process)   Collection Time: 11/25/16 11:16 AM  Result Value Ref Range   Right CCA prox sys 75 cm/s   Right CCA prox dias 22 cm/s   Right cca dist sys -78 cm/s   Left CCA prox sys 73 cm/s   Left CCA prox dias 23 cm/s   Left CCA dist sys -76 cm/s   Left CCA dist dias -17 cm/s   Left ICA prox sys 80 cm/s   Left ICA prox  dias 21 cm/s   Left ICA dist sys -71 cm/s   Left ICA dist dias -23 cm/s   RIGHT CCA MID DIAS 16.00 cm/s   RIGHT ECA DIAS -11.00 cm/s   RIGHT VERTEBRAL DIAS 19.00 cm/s   LEFT ECA DIAS -10.00 cm/s   LEFT VERTEBRAL DIAS -10.00 cm/s  VAS [place] LOWER EXTREMITY ARTERIAL DUPLEX     Status: None (In process)   Collection Time: 11/25/16 11:16 AM  Result Value Ref Range   Left super femoral prox sys PSV -181 cm/s   Left super femoral mid sys PSV -64 cm/s   Left super femoral dist sys PSV -59 cm/s   Left popliteal prox sys PSV 165 cm/s   Left popliteal dist sys PSV 18 cm/s   Right super femoral prox sys PSV 92 cm/s   Right super femoral mid sys PSV 100 cm/s   Right super femoral dist sys PSV -256 cm/s   Right popliteal prox sys PSV 84 cm/s   Right popliteal dist sys PSV 172 cm/s   Right peroneal sys PSV 46 cm/s   Left ant tibial distal sys 40 cm/s   left post tibial dist sys 44 cm/s   LEFT PERO DIST SYS -51.00 cm/s   RIGHT ANT DIST TIBAL SYS PSV 38 cm/s   RIGHT POST TIB DIST SYS -29 cm/s    Radiology No results found.   Assessment/Plan  Sick sinus syndrome (HCC) Now with a pacer  HTN (hypertension) blood pressure control important in reducing the progression of atherosclerotic disease. On appropriate oral medications.   Carotid atherosclerosis, bilateral Mild atherosclerosis with 1-39% stenosis present. Plan to recheck in 2 years  PVD (peripheral vascular disease) ([place]) perfusion remains preserved and peripheral aneurysms remained stable. No intervention for his multiple areas of stenosis without significant symptoms. Recheck in 1 year  AAA (abdominal aortic aneurysm) without rupture (HCC) Stable at 3.3 cm. Plan to recheck in 1 year.    Leotis Pain, MD  11/25/2016 1:14 PM    This note was created with Dragon medical transcription system.  Any errors from dictation are purely unintentional

## 2016-11-25 NOTE — Assessment & Plan Note (Signed)
blood pressure control important in reducing the progression of atherosclerotic disease. On appropriate oral medications.  

## 2016-11-28 DIAGNOSIS — I10 Essential (primary) hypertension: Secondary | ICD-10-CM | POA: Diagnosis not present

## 2016-11-29 DIAGNOSIS — R32 Unspecified urinary incontinence: Secondary | ICD-10-CM | POA: Diagnosis not present

## 2016-11-29 DIAGNOSIS — S020XXA Fracture of vault of skull, initial encounter for closed fracture: Secondary | ICD-10-CM | POA: Diagnosis not present

## 2016-11-29 DIAGNOSIS — I10 Essential (primary) hypertension: Secondary | ICD-10-CM | POA: Diagnosis not present

## 2016-11-30 DIAGNOSIS — I10 Essential (primary) hypertension: Secondary | ICD-10-CM | POA: Diagnosis not present

## 2016-12-01 DIAGNOSIS — J42 Unspecified chronic bronchitis: Secondary | ICD-10-CM | POA: Diagnosis not present

## 2016-12-01 DIAGNOSIS — E785 Hyperlipidemia, unspecified: Secondary | ICD-10-CM | POA: Diagnosis not present

## 2016-12-01 DIAGNOSIS — N184 Chronic kidney disease, stage 4 (severe): Secondary | ICD-10-CM | POA: Diagnosis not present

## 2016-12-01 DIAGNOSIS — M5416 Radiculopathy, lumbar region: Secondary | ICD-10-CM | POA: Diagnosis not present

## 2016-12-01 DIAGNOSIS — I495 Sick sinus syndrome: Secondary | ICD-10-CM | POA: Diagnosis not present

## 2016-12-01 DIAGNOSIS — Z8673 Personal history of transient ischemic attack (TIA), and cerebral infarction without residual deficits: Secondary | ICD-10-CM | POA: Diagnosis not present

## 2016-12-01 DIAGNOSIS — Z79899 Other long term (current) drug therapy: Secondary | ICD-10-CM | POA: Diagnosis not present

## 2016-12-01 DIAGNOSIS — D649 Anemia, unspecified: Secondary | ICD-10-CM | POA: Diagnosis not present

## 2016-12-01 DIAGNOSIS — I482 Chronic atrial fibrillation: Secondary | ICD-10-CM | POA: Diagnosis not present

## 2016-12-02 DIAGNOSIS — I10 Essential (primary) hypertension: Secondary | ICD-10-CM | POA: Diagnosis not present

## 2016-12-05 DIAGNOSIS — I10 Essential (primary) hypertension: Secondary | ICD-10-CM | POA: Diagnosis not present

## 2016-12-06 DIAGNOSIS — I10 Essential (primary) hypertension: Secondary | ICD-10-CM | POA: Diagnosis not present

## 2016-12-07 DIAGNOSIS — I10 Essential (primary) hypertension: Secondary | ICD-10-CM | POA: Diagnosis not present

## 2016-12-08 DIAGNOSIS — I10 Essential (primary) hypertension: Secondary | ICD-10-CM | POA: Diagnosis not present

## 2016-12-09 DIAGNOSIS — I10 Essential (primary) hypertension: Secondary | ICD-10-CM | POA: Diagnosis not present

## 2016-12-12 ENCOUNTER — Inpatient Hospital Stay
Admission: EM | Admit: 2016-12-12 | Discharge: 2016-12-14 | DRG: 605 | Disposition: A | Discharge status: Home / Self Care | Payer: Medicare HMO | Attending: Internal Medicine | Admitting: Internal Medicine

## 2016-12-12 ENCOUNTER — Encounter: Payer: Self-pay | Admitting: Emergency Medicine

## 2016-12-12 ENCOUNTER — Emergency Department: Payer: Medicare HMO

## 2016-12-12 DIAGNOSIS — Z950 Presence of cardiac pacemaker: Secondary | ICD-10-CM

## 2016-12-12 DIAGNOSIS — Z66 Do not resuscitate: Secondary | ICD-10-CM | POA: Diagnosis present

## 2016-12-12 DIAGNOSIS — M79602 Pain in left arm: Secondary | ICD-10-CM | POA: Diagnosis not present

## 2016-12-12 DIAGNOSIS — S40022A Contusion of left upper arm, initial encounter: Secondary | ICD-10-CM | POA: Diagnosis not present

## 2016-12-12 DIAGNOSIS — I2510 Atherosclerotic heart disease of native coronary artery without angina pectoris: Secondary | ICD-10-CM | POA: Diagnosis not present

## 2016-12-12 DIAGNOSIS — I480 Paroxysmal atrial fibrillation: Secondary | ICD-10-CM | POA: Diagnosis not present

## 2016-12-12 DIAGNOSIS — Z8249 Family history of ischemic heart disease and other diseases of the circulatory system: Secondary | ICD-10-CM | POA: Diagnosis not present

## 2016-12-12 DIAGNOSIS — Z87891 Personal history of nicotine dependence: Secondary | ICD-10-CM

## 2016-12-12 DIAGNOSIS — G4733 Obstructive sleep apnea (adult) (pediatric): Secondary | ICD-10-CM | POA: Diagnosis present

## 2016-12-12 DIAGNOSIS — Z8673 Personal history of transient ischemic attack (TIA), and cerebral infarction without residual deficits: Secondary | ICD-10-CM | POA: Diagnosis not present

## 2016-12-12 DIAGNOSIS — I129 Hypertensive chronic kidney disease with stage 1 through stage 4 chronic kidney disease, or unspecified chronic kidney disease: Secondary | ICD-10-CM | POA: Diagnosis present

## 2016-12-12 DIAGNOSIS — Z7901 Long term (current) use of anticoagulants: Secondary | ICD-10-CM

## 2016-12-12 DIAGNOSIS — Z952 Presence of prosthetic heart valve: Secondary | ICD-10-CM

## 2016-12-12 DIAGNOSIS — D62 Acute posthemorrhagic anemia: Secondary | ICD-10-CM | POA: Diagnosis present

## 2016-12-12 DIAGNOSIS — Z79899 Other long term (current) drug therapy: Secondary | ICD-10-CM

## 2016-12-12 DIAGNOSIS — M7981 Nontraumatic hematoma of soft tissue: Secondary | ICD-10-CM

## 2016-12-12 DIAGNOSIS — I739 Peripheral vascular disease, unspecified: Secondary | ICD-10-CM | POA: Diagnosis present

## 2016-12-12 DIAGNOSIS — S40029A Contusion of unspecified upper arm, initial encounter: Secondary | ICD-10-CM | POA: Diagnosis not present

## 2016-12-12 DIAGNOSIS — D469 Myelodysplastic syndrome, unspecified: Secondary | ICD-10-CM | POA: Diagnosis present

## 2016-12-12 DIAGNOSIS — E039 Hypothyroidism, unspecified: Secondary | ICD-10-CM | POA: Diagnosis present

## 2016-12-12 DIAGNOSIS — D631 Anemia in chronic kidney disease: Secondary | ICD-10-CM | POA: Diagnosis present

## 2016-12-12 DIAGNOSIS — N184 Chronic kidney disease, stage 4 (severe): Secondary | ICD-10-CM | POA: Diagnosis not present

## 2016-12-12 DIAGNOSIS — Y92009 Unspecified place in unspecified non-institutional (private) residence as the place of occurrence of the external cause: Secondary | ICD-10-CM | POA: Diagnosis not present

## 2016-12-12 DIAGNOSIS — R791 Abnormal coagulation profile: Secondary | ICD-10-CM | POA: Diagnosis present

## 2016-12-12 DIAGNOSIS — I10 Essential (primary) hypertension: Secondary | ICD-10-CM | POA: Diagnosis not present

## 2016-12-12 DIAGNOSIS — D649 Anemia, unspecified: Secondary | ICD-10-CM

## 2016-12-12 DIAGNOSIS — T45515A Adverse effect of anticoagulants, initial encounter: Secondary | ICD-10-CM | POA: Diagnosis present

## 2016-12-12 DIAGNOSIS — Z23 Encounter for immunization: Secondary | ICD-10-CM | POA: Diagnosis not present

## 2016-12-12 DIAGNOSIS — I482 Chronic atrial fibrillation: Secondary | ICD-10-CM | POA: Diagnosis present

## 2016-12-12 DIAGNOSIS — J449 Chronic obstructive pulmonary disease, unspecified: Secondary | ICD-10-CM | POA: Diagnosis present

## 2016-12-12 DIAGNOSIS — E785 Hyperlipidemia, unspecified: Secondary | ICD-10-CM | POA: Diagnosis present

## 2016-12-12 DIAGNOSIS — Z8679 Personal history of other diseases of the circulatory system: Secondary | ICD-10-CM

## 2016-12-12 DIAGNOSIS — S45992A Other specified injury of unspecified blood vessel at shoulder and upper arm level, left arm, initial encounter: Secondary | ICD-10-CM

## 2016-12-12 DIAGNOSIS — Z79891 Long term (current) use of opiate analgesic: Secondary | ICD-10-CM | POA: Diagnosis not present

## 2016-12-12 DIAGNOSIS — T148XXA Other injury of unspecified body region, initial encounter: Secondary | ICD-10-CM

## 2016-12-12 DIAGNOSIS — N189 Chronic kidney disease, unspecified: Secondary | ICD-10-CM | POA: Diagnosis not present

## 2016-12-12 DIAGNOSIS — I4891 Unspecified atrial fibrillation: Secondary | ICD-10-CM | POA: Diagnosis not present

## 2016-12-12 HISTORY — DX: Chronic kidney disease, unspecified: N18.9

## 2016-12-12 LAB — COMPREHENSIVE METABOLIC PANEL
ALK PHOS: 53 U/L (ref 38–126)
ALT: 10 U/L — AB (ref 17–63)
AST: 14 U/L — AB (ref 15–41)
Albumin: 2.9 g/dL — ABNORMAL LOW (ref 3.5–5.0)
Anion gap: 5 (ref 5–15)
BILIRUBIN TOTAL: 0.6 mg/dL (ref 0.3–1.2)
BUN: 50 mg/dL — AB (ref 6–20)
CALCIUM: 8.7 mg/dL — AB (ref 8.9–10.3)
CO2: 23 mmol/L (ref 22–32)
CREATININE: 3.78 mg/dL — AB (ref 0.61–1.24)
Chloride: 112 mmol/L — ABNORMAL HIGH (ref 101–111)
GFR calc Af Amer: 16 mL/min — ABNORMAL LOW (ref >60)
GFR, EST NON AFRICAN AMERICAN: 14 mL/min — AB (ref >60)
Glucose, Bld: 94 mg/dL (ref 65–99)
Potassium: 5.4 mmol/L — ABNORMAL HIGH (ref 3.5–5.1)
Sodium: 140 mmol/L (ref 135–145)
TOTAL PROTEIN: 6.7 g/dL (ref 6.5–8.1)

## 2016-12-12 LAB — CBC
HEMATOCRIT: 19.4 % — AB (ref 40.0–52.0)
HEMOGLOBIN: 6.4 g/dL — AB (ref 13.0–18.0)
MCH: 29.6 pg (ref 26.0–34.0)
MCHC: 33.1 g/dL (ref 32.0–36.0)
MCV: 89.4 fL (ref 80.0–100.0)
Platelets: 167 10*3/uL (ref 150–440)
RBC: 2.17 MIL/uL — AB (ref 4.40–5.90)
RDW: 16.8 % — AB (ref 11.5–14.5)
WBC: 3.8 10*3/uL (ref 3.8–10.6)

## 2016-12-12 LAB — PROTIME-INR
INR: 3.09
INR: 1.25
INR: 1.31
PROTHROMBIN TIME: 31.6 s — AB (ref 11.4–15.2)
PROTHROMBIN TIME: 15.6 s — AB (ref 11.4–15.2)
Prothrombin Time: 16.2 seconds — ABNORMAL HIGH (ref 11.4–15.2)

## 2016-12-12 LAB — ABO/RH: ABO/RH(D): A POS

## 2016-12-12 LAB — PREPARE RBC (CROSSMATCH)

## 2016-12-12 MED ORDER — HYDROCODONE-ACETAMINOPHEN 5-325 MG PO TABS
1.0000 | ORAL_TABLET | ORAL | Status: DC | PRN
  Administered 2016-12-12: 1 via ORAL
  Administered 2016-12-13: 2 via ORAL
  Administered 2016-12-13: 1 via ORAL
  Filled 2016-12-12: qty 1
  Filled 2016-12-12: qty 2
  Filled 2016-12-12: qty 1

## 2016-12-12 MED ORDER — PROTHROMBIN COMPLEX CONC HUMAN 500 UNITS IV KIT
2168.0000 [IU] | PACK | Status: AC
  Administered 2016-12-12: 2168 [IU] via INTRAVENOUS
  Filled 2016-12-12: qty 87

## 2016-12-12 MED ORDER — DOCUSATE SODIUM 100 MG PO CAPS: 100.0000 mg | ORAL_CAPSULE | Freq: Two times a day (BID) | ORAL | Status: DC | PRN

## 2016-12-12 MED ORDER — ALBUTEROL SULFATE (2.5 MG/3ML) 0.083% IN NEBU: 2.5000 mg | INHALATION_SOLUTION | Freq: Four times a day (QID) | RESPIRATORY_TRACT | Status: DC | PRN

## 2016-12-12 MED ORDER — MORPHINE SULFATE (PF) 2 MG/ML IV SOLN
1.0000 mg | INTRAVENOUS | Status: DC | PRN
  Administered 2016-12-12 – 2016-12-14 (×2): 1 mg via INTRAVENOUS
  Filled 2016-12-12 (×2): qty 1

## 2016-12-12 MED ORDER — VITAMIN K1 10 MG/ML IJ SOLN
10.0000 mg | INTRAVENOUS | Status: AC
  Administered 2016-12-12: 10 mg via INTRAVENOUS
  Filled 2016-12-12: qty 1

## 2016-12-12 MED ORDER — ROSUVASTATIN CALCIUM 10 MG PO TABS: 5.0000 mg | ORAL_TABLET | Freq: Every day | ORAL | Status: DC

## 2016-12-12 MED ORDER — SODIUM CHLORIDE 0.9 % IV SOLN
10.0000 mL/h | Freq: Once | INTRAVENOUS | Status: AC
  Administered 2016-12-12: 10 mL/h via INTRAVENOUS

## 2016-12-12 MED ORDER — AMLODIPINE BESYLATE 5 MG PO TABS
5.0000 mg | ORAL_TABLET | Freq: Every day | ORAL | Status: DC
  Administered 2016-12-12 – 2016-12-14 (×3): 5 mg via ORAL
  Filled 2016-12-12 (×3): qty 1

## 2016-12-12 MED ORDER — VITAMIN B-12 1000 MCG PO TABS
500.0000 ug | ORAL_TABLET | Freq: Two times a day (BID) | ORAL | Status: DC
  Administered 2016-12-12 – 2016-12-14 (×3): 500 ug via ORAL
  Filled 2016-12-12 (×3): qty 1

## 2016-12-12 NOTE — Progress Notes (Signed)
MEDICATION RELATED CONSULT NOTE - INITIAL   Pharmacy Consult for [facility] monitoring Indication: hematoma/bleeding on warfarin  No Known Allergies  Patient Measurements: Height: 5\' 11"  (180.3 cm) Weight: 180 lb (81.6 kg) IBW/kg (Calculated) : 75.3  Vital Signs: Temp: 97.8 F (36.6 C) (09/17 2032) Temp Source: Oral (09/17 2032) BP: 158/110 (09/17 2032) Pulse Rate: 110 (09/17 2032)  Labs:  Recent Labs  12/12/16 1118  WBC 3.8  HGB 6.4*  HCT 19.4*  PLT 167  CREATININE 3.78*  ALBUMIN 2.9*  PROT 6.7  AST 14*  ALT 10*  ALKPHOS 53  BILITOT 0.6   Estimated Creatinine Clearance: 15.8 mL/min (A) (by C-G formula based on SCr of 3.78 mg/dL (H)).   Medical History: Past Medical History:  Diagnosis Date  . Anemia   . Atrial fibrillation ([place])   . Bradycardia   . Chronic kidney disease   . COPD (chronic obstructive pulmonary disease) ([place])   . Coronary artery disease   . Depression   . Heart murmur   . HLD (hyperlipidemia)   . Hypertension   . Hypothyroidism   . Lumbar myelopathy ([place])   . MDS (myelodysplastic syndrome) ([place])   . OSA (obstructive sleep apnea)   . Stroke [facility])    left sided weakness  . Syncope     Assessment: Patient presented with upper arm pain and swelling with hemoglobin of 6.4 and hematoma. ED MD entered orders for anticoagulation reversal with vitamin K and KCentra.  INR = 3.1 on admission  Plan:  KCentra ordered for 25 units/kg (rounded to 2168 units for nearest vial size per protocol). Vitamin K 10 mg IV ordered by MD  INR ordered 60 minutes post KCentra infusion, q6h x 4, and daily x2 per protocol.  9/17:  INR @ 21:00 = 1.31.   Will recheck INR on 9/18 @ 0300.   Orene Desanctis, PharmD Clinical Pharmacist 12/12/2016,10:21 PM

## 2016-12-12 NOTE — ED Notes (Signed)
Per Dr. Corky Downs, pt is to get type specific blood on the floor.

## 2016-12-12 NOTE — Progress Notes (Signed)
Family Meeting Note  Advance Directive:yes  Today a meeting took place with the Patient, spouse and sister.  The following clinical team members were present during this meeting:MD  The following were discussed:Patient's diagnosis: Hematoma, CKD -4, A fib, htn, anemia , Patient's progosis: Unable to determine and Goals for treatment: DNR  Additional follow-up to be provided: Blood transfusion, Vascular consult.  Time spent during discussion:20 minutes  Kaetlin Bullen, Rosalio Macadamia, MD

## 2016-12-12 NOTE — Progress Notes (Signed)
MEDICATION RELATED CONSULT NOTE - INITIAL   Pharmacy Consult for [facility] monitoring Indication: hematoma/bleeding on warfarin  No Known Allergies  Patient Measurements: Height: 5\' 11"  (675.9 cm) Weight: 180 lb (81.6 kg) IBW/kg (Calculated) : 75.3  Vital Signs: Temp: 97.9 F (36.6 C) (09/17 1610) Temp Source: Oral (09/17 1610) BP: 155/91 (09/17 1610) Pulse Rate: 82 (09/17 1610)  Labs:  Recent Labs  12/12/16 1118  WBC 3.8  HGB 6.4*  HCT 19.4*  PLT 167  CREATININE 3.78*  ALBUMIN 2.9*  PROT 6.7  AST 14*  ALT 10*  ALKPHOS 53  BILITOT 0.6   Estimated Creatinine Clearance: 15.8 mL/min (A) (by C-G formula based on SCr of 3.78 mg/dL (H)).   Medical History: Past Medical History:  Diagnosis Date  . Anemia   . Atrial fibrillation ([place])   . Bradycardia   . Chronic kidney disease   . COPD (chronic obstructive pulmonary disease) ([place])   . Coronary artery disease   . Depression   . Heart murmur   . HLD (hyperlipidemia)   . Hypertension   . Hypothyroidism   . Lumbar myelopathy ([place])   . MDS (myelodysplastic syndrome) ([place])   . OSA (obstructive sleep apnea)   . Stroke [facility])    left sided weakness  . Syncope     Assessment: Patient presented with upper arm pain and swelling with hemoglobin of 6.4 and hematoma. ED MD entered orders for anticoagulation reversal with vitamin K and KCentra.  INR = 3.1 on admission  Plan:  KCentra ordered for 25 units/kg (rounded to 2168 units for nearest vial size per protocol). Vitamin K 10 mg IV ordered by MD  INR ordered 60 minutes post KCentra infusion, q6h x 4, and daily x2 per protocol.  Lenis Noon, PharmD Clinical Pharmacist 12/12/2016,4:38 PM

## 2016-12-12 NOTE — ED Triage Notes (Signed)
Pt presents to ED via [facility] with c/o L arm redness and pain that started Friday and swelling that started today. Per EMS pt is from home. Pt presents with redness that extends from elbow into L armpit, and swelling that extends from mid forearm to arm pit. Left arm is noted to be warm to the touch and tender on palpation. Pt is alert and oriented to self, place, and situation, but disoriented to time.

## 2016-12-12 NOTE — Consult Note (Signed)
[facility] Vascular Consult Note  MRN : [id number]  Kyle Vasquez is a 83 y.o. ([birth year]) male who presents with chief complaint of  Chief Complaint  Patient presents with  . Arm Pain  .  History of Present Illness: I am asked by Dr. Anselm Jungling to see the patient regarding left arm hematoma.  He is known to our practice for aneurysmal disease and PVD (both recently checked and stable). He is on anticoagulation for cardiac arrhythmias. He has been having worsening left arm pain and swelling over several days. No trauma, injury, or inciting events that caused the pain.  He has associated swelling and bruising.  He had a supratherapeutic INR level.  A duplex was done and I have reviewed this.  He has no DVT or superficial thrombophlebitis, but a moderate sized upper arm complex fluid collection consistent with a hematoma.     Current Facility-Administered Medications  Medication Dose Route Frequency Provider Last Rate Last Dose  . albuterol (PROVENTIL) (2.5 MG/3ML) 0.083% nebulizer solution 2.5 mg  2.5 mg Inhalation Q6H PRN Vaughan Basta, MD      . amLODipine (NORVASC) tablet 5 mg  5 mg Oral Daily Vaughan Basta, MD   5 mg at 12/12/16 1821  . docusate sodium (COLACE) capsule 100 mg  100 mg Oral BID PRN Vaughan Basta, MD      . HYDROcodone-acetaminophen (NORCO/VICODIN) 5-325 MG per tablet 1-2 tablet  1-2 tablet Oral Q4H PRN Vaughan Basta, MD   1 tablet at 12/12/16 1821  . vitamin B-12 (CYANOCOBALAMIN) tablet 500 mcg  500 mcg Oral BID Vaughan Basta, MD        Past Medical History:  Diagnosis Date  . Anemia   . Atrial fibrillation ([place])   . Bradycardia   . Chronic kidney disease   . COPD (chronic obstructive pulmonary disease) ([place])   . Coronary artery disease   . Depression   . Heart murmur   . HLD (hyperlipidemia)   . Hypertension   . Hypothyroidism   . Lumbar myelopathy ([place])   . MDS (myelodysplastic syndrome)  ([place])   . OSA (obstructive sleep apnea)   . Stroke [facility])    left sided weakness  . Syncope     Past Surgical History:  Procedure Laterality Date  . ABDOMINAL AORTIC ANEURYSM REPAIR    . BACK SURGERY    . CARDIAC SURGERY     mitral valve surgery  . CARDIAC VALVE REPLACEMENT    . NECK SURGERY    . PACEMAKER INSERTION Left 07/20/2016   Procedure: INSERTION PACEMAKER;  Surgeon: Isaias Cowman, MD;  Location: [facility] ORS;  Service: Cardiovascular;  Laterality: Left;    Social History Social History  Substance Use Topics  . Smoking status: Former Research scientist (life sciences)  . Smokeless tobacco: Former Systems developer  . Alcohol use No  Married  Family History Family History  Problem Relation Age of Onset  . Hypertension Mother   . Congestive Heart Failure Father   . Hypertension Father   No bleeding or clotting disorders  No Known Allergies   REVIEW OF SYSTEMS (Negative unless checked)  Constitutional: [x] Weight loss  [] Fever  [] Chills Cardiac: [] Chest pain   [] Chest pressure   [x] Palpitations   [] Shortness of breath when laying flat   [] Shortness of breath at rest   [x] Shortness of breath with exertion. Vascular:  [] Pain in legs with walking   [] Pain in legs at rest   [] Pain in legs when laying flat   [] Claudication   [] Pain  in feet when walking  [] Pain in feet at rest  [] Pain in feet when laying flat   [] History of DVT   [] Phlebitis   [] Swelling in legs   [] Varicose veins   [] Non-healing ulcers Pulmonary:   [] Uses home oxygen   [] Productive cough   [] Hemoptysis   [] Wheeze  [] COPD   [] Asthma Neurologic:  [] Dizziness  [] Blackouts   [] Seizures   [] History of stroke   [] History of TIA  [] Aphasia   [] Temporary blindness   [] Dysphagia   [x] Weakness or numbness in arms   [x] Weakness or numbness in legs Musculoskeletal:  [x] Arthritis   [] Joint swelling   [] Joint pain   [] Low back pain Hematologic:  [] Easy bruising  [] Easy bleeding   [] Hypercoagulable state   [] Anemic  [] Hepatitis Gastrointestinal:  [] Blood in  stool   [] Vomiting blood  [] Gastroesophageal reflux/heartburn   [] Difficulty swallowing. Genitourinary:  [] Chronic kidney disease   [] Difficult urination  [] Frequent urination  [] Burning with urination   [] Blood in urine Skin:  [] Rashes   [] Ulcers   [] Wounds Psychological:  [] History of anxiety   []  History of major depression.  Physical Examination  Vitals:   12/12/16 1639 12/12/16 1647 12/12/16 1723 12/12/16 1821  BP: (!) 141/85  (!) 160/105 (!) 160/105  Pulse: 97  (!) 109   Resp: 20  20   Temp: 97.7 F (36.5 C)     TempSrc: Oral     SpO2: (!) 80% 96% 98%   Weight:      Height:       Body mass index is 25.1 kg/m. Gen:  WD/WN, NAD Head: [place]/AT, No temporalis wasting.  Ear/Nose/Throat: Hearing grossly intact, nares w/o erythema or drainage, oropharynx w/o Erythema/Exudate Eyes: Sclera non-icteric, conjunctiva clear Neck: Trachea midline.  No JVD.  Pulmonary:  Good air movement, respirations not labored, equal bilaterally.  Cardiac: Irregular Vascular:  Vessel Right Left  Radial Palpable Palpable                          PT Palpable Palpable  DP Palpable Palpable    Musculoskeletal: diffuse weakness.  Extremities without ischemic changes. 2+ LUE edema Neurologic: Sensation grossly intact in extremities.  Symmetrical.  Speech is fluent. Motor exam as listed above. Psychiatric: Judgment intact, Mood & affect appropriate for pt's clinical situation. Dermatologic: No rashes or ulcers noted.  No cellulitis or open wounds. Moderate bruising on medial left upper arm       CBC Lab Results  Component Value Date   WBC 3.8 12/12/2016   HGB 6.4 (L) 12/12/2016   HCT 19.4 (L) 12/12/2016   MCV 89.4 12/12/2016   PLT 167 12/12/2016    BMET    Component Value Date/Time   NA 140 12/12/2016 1118   NA 139 12/05/2013 1131   K 5.4 (H) 12/12/2016 1118   K 4.2 12/05/2013 1131   CL 112 (H) 12/12/2016 1118   CL 109 (H) 12/05/2013 1131   CO2 23 12/12/2016 1118   CO2 25  12/05/2013 1131   GLUCOSE 94 12/12/2016 1118   GLUCOSE 100 (H) 12/05/2013 1131   BUN 50 (H) 12/12/2016 1118   BUN 32 (H) 12/05/2013 1131   CREATININE 3.78 (H) 12/12/2016 1118   CREATININE 2.13 (H) 12/05/2013 1131   CALCIUM 8.7 (L) 12/12/2016 1118   CALCIUM 8.8 12/05/2013 1131   GFRNONAA 14 (L) 12/12/2016 1118   GFRNONAA 28 (L) 12/05/2013 1131   GFRAA 16 (L) 12/12/2016 1118   GFRAA 33 (  L) 12/05/2013 1131   Estimated Creatinine Clearance: 15.8 mL/min (A) (by C-G formula based on SCr of 3.78 mg/dL (H)).  COAG Lab Results  Component Value Date   INR 1.25 12/12/2016   INR 3.09 12/12/2016   INR 4.90 (HH) 09/21/2016    Radiology US Venous Img Upper Uni Left  Result Date: 12/12/2016 CLINICAL DATA:  83 year old male with pain, swelling and bruising left arm for 4 days. Initial encounter. EXAM: Left UPPER EXTREMITY VENOUS DOPPLER ULTRASOUND TECHNIQUE: Gray-scale sonography with graded compression, as well as color Doppler and duplex ultrasound were performed to evaluate the upper extremity deep venous system from the level of the subclavian vein and including the jugular, axillary, basilic, radial, ulnar and upper cephalic vein. Spectral Doppler was utilized to evaluate flow at rest and with distal augmentation maneuvers. COMPARISON:  None. FINDINGS: Contralateral Subclavian Vein: Respiratory phasicity is normal and symmetric with the symptomatic side. No evidence of thrombus. Normal compressibility. Internal Jugular Vein: No evidence of thrombus. Normal compressibility, respiratory phasicity and response to augmentation. Subclavian Vein: No evidence of thrombus. Normal compressibility, respiratory phasicity and response to augmentation. Axillary Vein: No evidence of thrombus. Normal compressibility, respiratory phasicity and response to augmentation. Cephalic Vein: No evidence of thrombus. Normal compressibility, respiratory phasicity and response to augmentation. Basilic Vein: No evidence of  thrombus. Normal compressibility, respiratory phasicity and response to augmentation. Brachial Veins: No evidence of thrombus. Normal compressibility, respiratory phasicity and response to augmentation. Radial Veins: No evidence of thrombus. Normal compressibility, respiratory phasicity and response to augmentation. Ulnar Veins: No evidence of thrombus. Normal compressibility, respiratory phasicity and response to augmentation. Venous Reflux:  None visualized. Other Findings: Complex mass inner aspect left upper arm spanning over 8.9 x 3.8 x 5 cm containing complex central fluid collection spanning over 2.3 cm without central flow. IMPRESSION: No evidence of DVT within the left upper extremity. Complex mass inner aspect left upper arm spanning over 8.9 x 3.8 x 5 cm containing complex central fluid collection spanning over 2.3 cm without central flow. This may represent a hematoma given history of bruising. Infection or primary mass less likely considerations. Electronically Signed   By: Genia Del M.D.   On: 12/12/2016 12:42      Assessment/Plan 1. LUE hematoma.  Likely spontaneous from Coumadin.  This is being reversed.  There is no skin threat.  No need for surgery or evacuation at this point.  Would elevate arm as tolerated. Would hold anticoagulation for 48-72 hours.  Would consider new agent or lower level or anticoagulation on resumption 2. Anemia. Likely acute blood loss on top of chronic disease.  Getting a unit of blood.  Recheck in the am 3. AAA/popliteal aneurysms. Recently checked and stable. 4. HTN. Stable on outpatient medications and blood pressure control important in reducing the progression of atherosclerotic disease and aneurysmal disease. On appropriate oral medications.    Leotis Pain, MD  12/12/2016 8:13 PM    This note was created with Dragon medical transcription system.  Any error is purely unintentional

## 2016-12-12 NOTE — H&P (Signed)
[facility] NAME: Kyle Vasquez    MR#:  [id number]  DATE OF BIRTH:  08/11/1931  DATE OF ADMISSION:  12/12/2016  PRIMARY CARE PHYSICIAN: Tracie Harrier, MD   REQUESTING/REFERRING PHYSICIAN: [place]  CHIEF COMPLAINT:   Chief Complaint  Patient presents with  . Arm Pain    HISTORY OF PRESENT ILLNESS: Souleymane Saiki  is a 83 y.o. male with a known history of A fib, anemia, CKD stage4, COPD, CAD, HLD, HTN, hypothyroidism, Stroke- have left arm pain for few days. Denies fall or injuries. As per family, does not walk at baseline. On Coumadin for A fib. Noted to have echymosis on arm, sent for Doppler, have hematoma. ER spoke to Dr. Carroll Kinds- he suggest to reverse Coumadin and transfuse as Hb is low.  PAST MEDICAL HISTORY:   Past Medical History:  Diagnosis Date  . Anemia   . Atrial fibrillation ([place])   . Bradycardia   . Chronic kidney disease   . COPD (chronic obstructive pulmonary disease) ([place])   . Coronary artery disease   . Depression   . Heart murmur   . HLD (hyperlipidemia)   . Hypertension   . Hypothyroidism   . Lumbar myelopathy ([place])   . MDS (myelodysplastic syndrome) ([place])   . OSA (obstructive sleep apnea)   . Stroke [facility])    left sided weakness  . Syncope     PAST SURGICAL HISTORY: Past Surgical History:  Procedure Laterality Date  . ABDOMINAL AORTIC ANEURYSM REPAIR    . BACK SURGERY    . CARDIAC SURGERY     mitral valve surgery  . CARDIAC VALVE REPLACEMENT    . NECK SURGERY    . PACEMAKER INSERTION Left 07/20/2016   Procedure: INSERTION PACEMAKER;  Surgeon: Isaias Cowman, MD;  Location: [facility] ORS;  Service: Cardiovascular;  Laterality: Left;    SOCIAL HISTORY:  Social History  Substance Use Topics  . Smoking status: Former Research scientist (life sciences)  . Smokeless tobacco: Former Systems developer  . Alcohol use No    FAMILY HISTORY:  Family History  Problem Relation Age of Onset  . Hypertension Mother   . Congestive Heart  Failure Father   . Hypertension Father     DRUG ALLERGIES: No Known Allergies  REVIEW OF SYSTEMS:   CONSTITUTIONAL: No fever,positive for fatigue or weakness.  EYES: No blurred or double vision.  EARS, NOSE, AND THROAT: No tinnitus or ear pain.  RESPIRATORY: No cough, shortness of breath, wheezing or hemoptysis.  CARDIOVASCULAR: No chest pain, orthopnea, edema.  GASTROINTESTINAL: No nausea, vomiting, diarrhea or abdominal pain.  GENITOURINARY: No dysuria, hematuria.  ENDOCRINE: No polyuria, nocturia,  HEMATOLOGY: No anemia, easy bruising or bleeding SKIN: No rash or lesion. MUSCULOSKELETAL: No joint pain or arthritis.   NEUROLOGIC: No tingling, numbness, weakness.  PSYCHIATRY: No anxiety or depression.   MEDICATIONS AT HOME:  Prior to Admission medications   Medication Sig Start Date End Date Taking? Authorizing Provider  amLODipine (NORVASC) 5 MG tablet Take 1 tablet (5 mg total) by mouth daily. 07/01/16  Yes Fritzi Mandes, MD  cyanocobalamin 500 MCG tablet Take 500 mcg by mouth 2 (two) times daily.   Yes [provider]  HYDROcodone-acetaminophen (NORCO/VICODIN) 5-325 MG tablet Take 1-2 tablets by mouth every 4 (four) hours as needed for moderate pain. 09/21/16  Yes Epifanio Lesches, MD  warfarin (COUMADIN) 3 MG tablet Take 1 tablet (3 mg total) by mouth daily. Patient taking differently: Take 4 mg by mouth daily.  09/21/16 09/21/17 Yes Epifanio Lesches, MD  albuterol (PROVENTIL HFA;VENTOLIN HFA) 108 (90 Base) MCG/ACT inhaler Inhale 2 puffs into the lungs every 6 (six) hours as needed for wheezing or shortness of breath.    [provider]  levothyroxine (SYNTHROID, LEVOTHROID) 25 MCG tablet Take 1 tablet (25 mcg total) by mouth daily before breakfast. Patient not taking: Reported on 12/12/2016 07/22/16   Clabe Seal, PA-C  rosuvastatin (CRESTOR) 5 MG tablet Take 1 tablet (5 mg total) by mouth daily at 6 PM. Patient not taking: Reported on 12/12/2016 07/21/16    Clabe Seal, PA-C      PHYSICAL EXAMINATION:   VITAL SIGNS: Blood pressure (!) 167/114, pulse 61, temperature 98 F (36.7 C), temperature source Oral, resp. rate 14, height 5\' 11"  (1.803 m), weight 81.6 kg (180 lb), SpO2 100 %.  GENERAL:  83 y.o.-year-old patient lying in the bed with no acute distress.  EYES: Pupils equal, round, reactive to light and accommodation. No scleral icterus. Extraocular muscles intact.  HEENT: Head atraumatic, normocephalic. Oropharynx and nasopharynx clear.  NECK:  Supple, no jugular venous distention. No thyroid enlargement, no tenderness.  LUNGS: Normal breath sounds bilaterally, no wheezing, rales,rhonchi or crepitation. No use of accessory muscles of respiration.  CARDIOVASCULAR: S1, S2 normal. No murmurs, rubs, or gallops.  ABDOMEN: Soft, nontender, nondistended. Bowel sounds present. No organomegaly or mass.  EXTREMITIES: No pedal edema, cyanosis, or clubbing. Left arm on medial side- echymosis and some tenderness. Radial pulses and movements of fingers is preserved. NEUROLOGIC: Cranial nerves II through XII are intact. Muscle strength 4/5 in all extremities. Sensation intact. Gait not checked.  PSYCHIATRIC: The patient is alert and oriented x 3.  SKIN: No obvious rash, lesion, or ulcer.   LABORATORY PANEL:   CBC  Recent Labs Lab 12/12/16 1118  WBC 3.8  HGB 6.4*  HCT 19.4*  PLT 167  MCV 89.4  MCH 29.6  MCHC 33.1  RDW 16.8*   ------------------------------------------------------------------------------------------------------------------  Chemistries   Recent Labs Lab 12/12/16 1118  NA 140  K 5.4*  CL 112*  CO2 23  GLUCOSE 94  BUN 50*  CREATININE 3.78*  CALCIUM 8.7*  AST 14*  ALT 10*  ALKPHOS 53  BILITOT 0.6   ------------------------------------------------------------------------------------------------------------------ estimated creatinine clearance is 15.8 mL/min (A) (by C-G formula based on SCr of 3.78 mg/dL  (H)). ------------------------------------------------------------------------------------------------------------------ No results for input(s): TSH, T4TOTAL, T3FREE, THYROIDAB in the last 72 hours.  Invalid input(s): FREET3   Coagulation profile  Recent Labs Lab 12/12/16 1118  INR 3.09   ------------------------------------------------------------------------------------------------------------------- No results for input(s): DDIMER in the last 72 hours. -------------------------------------------------------------------------------------------------------------------  Cardiac Enzymes No results for input(s): CKMB, TROPONINI, MYOGLOBIN in the last 168 hours.  Invalid input(s): CK ------------------------------------------------------------------------------------------------------------------ Invalid input(s): POCBNP  ---------------------------------------------------------------------------------------------------------------  Urinalysis    Component Value Date/Time   COLORURINE YELLOW (A) 06/23/2016 1510   APPEARANCEUR TURBID (A) 06/23/2016 1510   APPEARANCEUR Clear 12/05/2013 1211   LABSPEC 1.010 06/23/2016 1510   LABSPEC 1.013 12/05/2013 1211   PHURINE 5.0 06/23/2016 1510   GLUCOSEU NEGATIVE 06/23/2016 1510   GLUCOSEU Negative 12/05/2013 1211   HGBUR SMALL (A) 06/23/2016 1510   BILIRUBINUR NEGATIVE 06/23/2016 1510   BILIRUBINUR Negative 12/05/2013 1211   KETONESUR NEGATIVE 06/23/2016 1510   PROTEINUR 100 (A) 06/23/2016 1510   NITRITE NEGATIVE 06/23/2016 1510   LEUKOCYTESUR LARGE (A) 06/23/2016 1510   LEUKOCYTESUR Negative 12/05/2013 1211     RADIOLOGY: US Venous Img Upper Uni Left  Result Date: 12/12/2016 CLINICAL DATA:  83 year old  male with pain, swelling and bruising left arm for 4 days. Initial encounter. EXAM: Left UPPER EXTREMITY VENOUS DOPPLER ULTRASOUND TECHNIQUE: Gray-scale sonography with graded compression, as well as color Doppler and duplex  ultrasound were performed to evaluate the upper extremity deep venous system from the level of the subclavian vein and including the jugular, axillary, basilic, radial, ulnar and upper cephalic vein. Spectral Doppler was utilized to evaluate flow at rest and with distal augmentation maneuvers. COMPARISON:  None. FINDINGS: Contralateral Subclavian Vein: Respiratory phasicity is normal and symmetric with the symptomatic side. No evidence of thrombus. Normal compressibility. Internal Jugular Vein: No evidence of thrombus. Normal compressibility, respiratory phasicity and response to augmentation. Subclavian Vein: No evidence of thrombus. Normal compressibility, respiratory phasicity and response to augmentation. Axillary Vein: No evidence of thrombus. Normal compressibility, respiratory phasicity and response to augmentation. Cephalic Vein: No evidence of thrombus. Normal compressibility, respiratory phasicity and response to augmentation. Basilic Vein: No evidence of thrombus. Normal compressibility, respiratory phasicity and response to augmentation. Brachial Veins: No evidence of thrombus. Normal compressibility, respiratory phasicity and response to augmentation. Radial Veins: No evidence of thrombus. Normal compressibility, respiratory phasicity and response to augmentation. Ulnar Veins: No evidence of thrombus. Normal compressibility, respiratory phasicity and response to augmentation. Venous Reflux:  None visualized. Other Findings: Complex mass inner aspect left upper arm spanning over 8.9 x 3.8 x 5 cm containing complex central fluid collection spanning over 2.3 cm without central flow. IMPRESSION: No evidence of DVT within the left upper extremity. Complex mass inner aspect left upper arm spanning over 8.9 x 3.8 x 5 cm containing complex central fluid collection spanning over 2.3 cm without central flow. This may represent a hematoma given history of bruising. Infection or primary mass less likely  considerations. Electronically Signed   By: Genia Del M.D.   On: 12/12/2016 12:42    EKG: Orders placed or performed during the hospital encounter of 09/19/16  . EKG 12-Lead  . EKG 12-Lead    IMPRESSION AND PLAN:  * Hematoma , left arm   On coumadin     Vit K given by ER.    May need to keep Off Coumadin for a few days.  * Ac on Ch anemia due to blood loss   Transfusion ordered by ER.    Monitor.  * CKD stage 4- stable for now;  * A fib    On Coumadin, hold for now due to bleed  * Htn    Cont amlodipine  * HLD    Cont Rosuvastatin  * Weakness,     Not walking at home    PT eval.  All the records are reviewed and case discussed with ED provider. Management plans discussed with the patient, family and they are in agreement.  CODE STATUS: DNR Code Status History    Date Active Date Inactive Code Status Order ID Comments User Context   09/19/2016  7:02 PM 09/21/2016  7:55 PM Full Code [id number]  Bettey Costa, MD Inpatient   07/20/2016  4:50 PM 07/21/2016  4:51 PM Full Code [id number]  Isaias Cowman, MD Inpatient   06/29/2016  7:37 PM 07/01/2016  9:03 PM Full Code [id number]  Gladstone Lighter, MD Inpatient   09/07/2015  9:31 PM 09/08/2015  7:58 PM Full Code [id number]  Quintella Baton, MD Inpatient    Advance Directive Documentation     Most Recent Value  Type of Advance Directive  Healthcare Power of Attorney, Living will  Pre-existing out of facility DNR order (  yellow form or pink MOST form)  -  "MOST" Form in Place?  -       TOTAL TIME TAKING CARE OF THIS PATIENT: 45 minutes.  Wife, sister and bro-in-law present in room.  Vaughan Basta M.D on 12/12/2016   Between 7am to 6pm - Pager - [phone]  After 6pm go to [email or web address] - password [email or web address] [facility]  Office  [phone]  CC: Primary care physician; Tracie Harrier, MD   Note: This dictation was prepared with Dragon dictation along with smaller phrase technology.  Any transcriptional errors that result from this process are unintentional.

## 2016-12-12 NOTE — ED Provider Notes (Signed)
[facility] Emergency Department Provider Note   ____________________________________________    I have reviewed the triage vital signs and the nursing notes.   HISTORY  Chief Complaint Arm Pain     HPI Kyle Vasquez is a 83 y.o. male who presents with complaints of left upper arm pain which is moderate to severe, this started 3 days ago. Reportedly it started to swell today. He complains with pain with palpation. He has never had this before. He is on Coumadin for atrial fibrillation. No fevers or chills. Normal range of motion of the left hand but pain in the upper arm with movement of the left arm   Past Medical History:  Diagnosis Date  . Anemia   . Atrial fibrillation ([place])   . Bradycardia   . COPD (chronic obstructive pulmonary disease) ([place])   . Coronary artery disease   . Depression   . Heart murmur   . HLD (hyperlipidemia)   . Hypertension   . Hypothyroidism   . Lumbar myelopathy ([place])   . MDS (myelodysplastic syndrome) ([place])   . OSA (obstructive sleep apnea)   . Stroke [facility])    left sided weakness  . Syncope     Patient Active Problem List   Diagnosis Date Noted  . Carotid atherosclerosis, bilateral 11/25/2016  . PVD (peripheral vascular disease) ([place]) 11/25/2016  . AAA (abdominal aortic aneurysm) without rupture ([place]) 11/25/2016  . Elevated troponin 09/20/2016  . Sick sinus syndrome ([place]) 07/20/2016  . Syncope 06/29/2016  . ARF (acute renal failure) ([place]) 06/29/2016  . Bradycardia 09/08/2015  . Symptomatic bradycardia 09/07/2015  . Syncope and collapse 09/07/2015  . Atrial fibrillation ([place]) 09/07/2015  . HTN (hypertension) 09/07/2015  . Physical deconditioning 09/07/2015  . H/O: CVA (cerebrovascular accident) 09/07/2015    Past Surgical History:  Procedure Laterality Date  . ABDOMINAL AORTIC ANEURYSM REPAIR    . BACK SURGERY    . CARDIAC SURGERY     mitral valve surgery  . CARDIAC VALVE REPLACEMENT    . NECK  SURGERY    . PACEMAKER INSERTION Left 07/20/2016   Procedure: INSERTION PACEMAKER;  Surgeon: Isaias Cowman, MD;  Location: [facility] ORS;  Service: Cardiovascular;  Laterality: Left;    Prior to Admission medications   Medication Sig Start Date End Date Taking? Authorizing Provider  albuterol (PROVENTIL HFA;VENTOLIN HFA) 108 (90 Base) MCG/ACT inhaler Inhale 2 puffs into the lungs every 6 (six) hours as needed for wheezing or shortness of breath.    [provider]  amLODipine (NORVASC) 5 MG tablet Take 1 tablet (5 mg total) by mouth daily. 07/01/16   Fritzi Mandes, MD  cyanocobalamin 500 MCG tablet Take 500 mcg by mouth 2 (two) times daily.    [provider]  HYDROcodone-acetaminophen (NORCO/VICODIN) 5-325 MG tablet Take 1-2 tablets by mouth every 4 (four) hours as needed for moderate pain. Patient not taking: Reported on 11/25/2016 09/21/16   Epifanio Lesches, MD  levothyroxine (SYNTHROID, LEVOTHROID) 25 MCG tablet Take 1 tablet (25 mcg total) by mouth daily before breakfast. 07/22/16   Clabe Seal, PA-C  rosuvastatin (CRESTOR) 5 MG tablet Take 1 tablet (5 mg total) by mouth daily at 6 PM. 07/21/16   Clabe Seal, PA-C  warfarin (COUMADIN) 3 MG tablet Take 1 tablet (3 mg total) by mouth daily. 09/21/16 09/21/17  Epifanio Lesches, MD     Allergies Patient has no known allergies.  Family History  Problem Relation Age of Onset  . Hypertension Mother   .  Congestive Heart Failure Father   . Hypertension Father     Social History Social History  Substance Use Topics  . Smoking status: Former Research scientist (life sciences)  . Smokeless tobacco: Former Systems developer  . Alcohol use No    Review of Systems  Constitutional: No fever/chills Eyes: No visual changes.  ENT: No sore throat. Cardiovascular: Denies chest pain. Respiratory: Denies shortness of breath. Gastrointestinal: No abdominal pain.  No nausea, no vomiting.   Genitourinary: Negative for dysuria. Musculoskeletal: Left arm pain as  above Skin: Bruising to the left upper arm Neurological: Negative for headaches    ____________________________________________   PHYSICAL EXAM:  VITAL SIGNS: ED Triage Vitals  Enc Vitals Group     BP 12/12/16 1111 (!) 143/83     Pulse Rate 12/12/16 1111 (!) 110     Resp 12/12/16 1111 20     Temp 12/12/16 1111 98 F (36.7 C)     Temp Source 12/12/16 1111 Oral     SpO2 12/12/16 1109 98 %     Weight 12/12/16 1111 81.6 kg (180 lb)     Height 12/12/16 1111 1.803 m (5\' 11" )     Head Circumference --      Peak Flow --      Pain Score --      Pain Loc --      Pain Edu? --      Excl. in [place]? --     Constitutional: Alert and oriented. No acute distress.  Eyes: Conjunctivae are normal.  Nose: No congestion/rhinnorhea. Mouth/Throat: Mucous membranes are moist.   Neck:  Painless ROM Cardiovascular: Normal rate, regular rhythm. Grossly normal heart sounds.  Good peripheral circulation. Respiratory: Normal respiratory effort.  No retractions. Lungs CTAB. Gastrointestinal: Soft and nontender. No distention.  No CVA tenderness. Genitourinary: deferred Musculoskeletal: Left arm with significant swelling along the upper arm which is tender to palpation, appears more ecchymotic than erythematous. 2+ distal pulses Neurologic:  Normal speech and language. No gross focal neurologic deficits are appreciated.  Skin:  Skin is warm, dry and intact. No rash noted. Psychiatric: Mood and affect are normal. Speech and behavior are normal.  ____________________________________________   LABS (all labs ordered are listed, but only abnormal results are displayed)  Labs Reviewed  CBC - Abnormal; Notable for the following:       Result Value   RBC 2.17 (*)    Hemoglobin 6.4 (*)    HCT 19.4 (*)    RDW 16.8 (*)    All other components within normal limits  COMPREHENSIVE METABOLIC PANEL - Abnormal; Notable for the following:    Potassium 5.4 (*)    Chloride 112 (*)    BUN 50 (*)    Creatinine,  Ser 3.78 (*)    Calcium 8.7 (*)    Albumin 2.9 (*)    AST 14 (*)    ALT 10 (*)    GFR calc non Af Amer 14 (*)    GFR calc Af Amer 16 (*)    All other components within normal limits  PROTIME-INR - Abnormal; Notable for the following:    Prothrombin Time 31.6 (*)    All other components within normal limits  TYPE AND SCREEN  PREPARE RBC (CROSSMATCH)  ABO/RH   ____________________________________________  EKG  None ____________________________________________  RADIOLOGY  Ultrasound is consistent with large hematoma in the left upper arm ____________________________________________   PROCEDURES  Procedure(s) performed: No    Critical Care performed: yes  CRITICAL CARE Performed by: Lavonia Drafts  Total critical care time: 30 minutes  Critical care time was exclusive of separately billable procedures and treating other patients.  Critical care was necessary to treat or prevent imminent or life-threatening deterioration.  Critical care was time spent personally by me on the following activities: development of treatment plan with patient and/or surrogate as well as nursing, discussions with consultants, evaluation of patient's response to treatment, examination of patient, obtaining history from patient or surrogate, ordering and performing treatments and interventions, ordering and review of laboratory studies, ordering and review of radiographic studies, pulse oximetry and re-evaluation of patient's condition.  ____________________________________________   INITIAL IMPRESSION / ASSESSMENT AND PLAN / ED COURSE  Pertinent labs & imaging results that were available during my care of the patient were reviewed by me and considered in my medical decision making (see chart for details).  Patient presented with significant swelling in the left upper arm. Initial concern was for DVT although ecchymosis was suspicious for hematoma given his use of Coumadin. Ultrasound and  significant drop in hemoglobin most consistent with hematoma.  Discussed with Dr. Delana Meyer of vascular surgery who recommends reversal of Coumadin, transfusion and admission to medicine for further management  Discussed this with the patient and family.    ____________________________________________   FINAL CLINICAL IMPRESSION(S) / ED DIAGNOSES  Final diagnoses:  Intramural hematoma of artery of left upper extremity, initial encounter      NEW MEDICATIONS STARTED DURING THIS VISIT:  New Prescriptions   No medications on file     Note:  This document was prepared using Dragon voice recognition software and may include unintentional dictation errors.    Lavonia Drafts, MD 12/12/16 1315

## 2016-12-12 NOTE — Progress Notes (Addendum)
MEDICATION RELATED CONSULT NOTE - INITIAL   Pharmacy Consult for [facility] monitoring Indication: hematoma/bleeding on warfarin  No Known Allergies  Patient Measurements: Height: 5\' 11"  (180.3 cm) Weight: 180 lb (81.6 kg) IBW/kg (Calculated) : 75.3  Vital Signs: Temp: 97.8 F (36.6 C) (09/17 2032) Temp Source: Oral (09/17 2032) BP: 158/110 (09/17 2032) Pulse Rate: 110 (09/17 2032)  Labs:  Recent Labs  12/12/16 1118  WBC 3.8  HGB 6.4*  HCT 19.4*  PLT 167  CREATININE 3.78*  ALBUMIN 2.9*  PROT 6.7  AST 14*  ALT 10*  ALKPHOS 53  BILITOT 0.6   Estimated Creatinine Clearance: 15.8 mL/min (A) (by C-G formula based on SCr of 3.78 mg/dL (H)).   Medical History: Past Medical History:  Diagnosis Date  . Anemia   . Atrial fibrillation ([place])   . Bradycardia   . Chronic kidney disease   . COPD (chronic obstructive pulmonary disease) ([place])   . Coronary artery disease   . Depression   . Heart murmur   . HLD (hyperlipidemia)   . Hypertension   . Hypothyroidism   . Lumbar myelopathy ([place])   . MDS (myelodysplastic syndrome) ([place])   . OSA (obstructive sleep apnea)   . Stroke [facility])    left sided weakness  . Syncope     Assessment: Patient presented with upper arm pain and swelling with hemoglobin of 6.4 and hematoma. ED MD entered orders for anticoagulation reversal with vitamin K and KCentra.  INR = 3.1 on admission  Plan:  KCentra ordered for 25 units/kg (rounded to 2168 units for nearest vial size per protocol). Vitamin K 10 mg IV ordered by MD  INR ordered 60 minutes post KCentra infusion, q6h x 4, and daily x2 per protocol.  0917 1500 INR 1.25 0917 2100 INR 1.31 0918 0300 INR 1.26    Betsabe Iglesia S, PharmD Clinical Pharmacist 12/12/2016,10:18 PM

## 2016-12-12 NOTE — ED Notes (Signed)
Pt taken to US

## 2016-12-13 DIAGNOSIS — N189 Chronic kidney disease, unspecified: Secondary | ICD-10-CM

## 2016-12-13 LAB — PROTIME-INR
INR: 1.26
INR: 1.28
Prothrombin Time: 15.7 seconds — ABNORMAL HIGH (ref 11.4–15.2)
Prothrombin Time: 15.9 seconds — ABNORMAL HIGH (ref 11.4–15.2)

## 2016-12-13 LAB — CBC
HCT: 21.8 % — ABNORMAL LOW (ref 40.0–52.0)
HEMOGLOBIN: 7.5 g/dL — AB (ref 13.0–18.0)
MCH: 30.7 pg (ref 26.0–34.0)
MCHC: 34.5 g/dL (ref 32.0–36.0)
MCV: 88.9 fL (ref 80.0–100.0)
PLATELETS: 175 10*3/uL (ref 150–440)
RBC: 2.45 MIL/uL — AB (ref 4.40–5.90)
RDW: 16.3 % — ABNORMAL HIGH (ref 11.5–14.5)
WBC: 4.7 10*3/uL (ref 3.8–10.6)

## 2016-12-13 LAB — BASIC METABOLIC PANEL
Anion gap: 5 (ref 5–15)
BUN: 51 mg/dL — ABNORMAL HIGH (ref 6–20)
CHLORIDE: 114 mmol/L — AB (ref 101–111)
CO2: 22 mmol/L (ref 22–32)
CREATININE: 3.44 mg/dL — AB (ref 0.61–1.24)
Calcium: 8.6 mg/dL — ABNORMAL LOW (ref 8.9–10.3)
GFR calc non Af Amer: 15 mL/min — ABNORMAL LOW (ref >60)
GFR, EST AFRICAN AMERICAN: 18 mL/min — AB (ref >60)
Glucose, Bld: 89 mg/dL (ref 65–99)
Potassium: 4.9 mmol/L (ref 3.5–5.1)
Sodium: 141 mmol/L (ref 135–145)

## 2016-12-13 MED ORDER — ACETAMINOPHEN 325 MG PO TABS: 650.0000 mg | ORAL_TABLET | Freq: Four times a day (QID) | ORAL | Status: DC | PRN

## 2016-12-13 MED ORDER — PNEUMOCOCCAL VAC POLYVALENT 25 MCG/0.5ML IJ INJ
0.5000 mL | INJECTION | INTRAMUSCULAR | Status: AC
  Administered 2016-12-14: 0.5 mL via INTRAMUSCULAR
  Filled 2016-12-13: qty 0.5

## 2016-12-13 NOTE — Progress Notes (Signed)
While rounding unit [place] visit pt. Pt was sitting on the bed and pt's niece was at bedside. Pt talked about his family and his illness and asked [place] for prayers. [place] offered prayers and pastoral presence. [place] will follow up with pt as needed.    12/13/16 1500  Clinical Encounter Type  Visited With Patient  Visit Type Initial;Spiritual support  Referral From Chaplain  Consult/Referral To [place];Other (Comment)  Advance Directives (For Healthcare)  Does Patient Have a Medical Advance Directive? Yes

## 2016-12-13 NOTE — Progress Notes (Signed)
[facility]  Daily Progress Note   Subjective  - * No surgery found *  Patient says the arm is better. No events overnight. Renal function worse today  Objective Vitals:   12/13/16 0041 12/13/16 0530 12/13/16 0531 12/13/16 0640  BP: (!) 158/90 (!) 183/99 (!) 163/110 (!) 142/107  Pulse: 69 99 (!) 105 (!) 105  Resp:  20    Temp:  97.9 F (36.6 C)    TempSrc:  Oral    SpO2:  100% 100%   Weight:      Height:        Intake/Output Summary (Last 24 hours) at 12/13/16 0820 Last data filed at 12/13/16 0600  Gross per 24 hour  Intake              286 ml  Output              100 ml  Net              186 ml    PULM  CTAB CV  Irregular VASC  Left arm swelling 1+ with stable bruising, better today  Laboratory CBC    Component Value Date/Time   WBC 4.7 12/13/2016 0253   HGB 7.5 (L) 12/13/2016 0253   HGB 10.4 (L) 12/05/2013 1131   HCT 21.8 (L) 12/13/2016 0253   HCT 31.8 (L) 12/05/2013 1131   PLT 175 12/13/2016 0253   PLT 140 (L) 12/05/2013 1131    BMET    Component Value Date/Time   NA 141 12/13/2016 0253   NA 139 12/05/2013 1131   K 4.9 12/13/2016 0253   K 4.2 12/05/2013 1131   CL 114 (H) 12/13/2016 0253   CL 109 (H) 12/05/2013 1131   CO2 22 12/13/2016 0253   CO2 25 12/05/2013 1131   GLUCOSE 89 12/13/2016 0253   GLUCOSE 100 (H) 12/05/2013 1131   BUN 51 (H) 12/13/2016 0253   BUN 32 (H) 12/05/2013 1131   CREATININE 3.44 (H) 12/13/2016 0253   CREATININE 2.13 (H) 12/05/2013 1131   CALCIUM 8.6 (L) 12/13/2016 0253   CALCIUM 8.8 12/05/2013 1131   GFRNONAA 15 (L) 12/13/2016 0253   GFRNONAA 28 (L) 12/05/2013 1131   GFRAA 18 (L) 12/13/2016 0253   GFRAA 33 (L) 12/05/2013 1131    Assessment/Planning:    Left arm hematoma, improving with elevation and anticoagulation reversal  Keep arm elevated  Hold anticoagulation 24-48 hours before resuming  Renal function is worsening. Baseline is poor.  Worrisome situation    Leotis Pain  12/13/2016, 8:20 AM

## 2016-12-13 NOTE — Progress Notes (Signed)
[place] [facility] [place] NAME: Kyle Vasquez    MR#:  [id number]  DATE OF BIRTH:  June 19, 1931  SUBJECTIVE:  CHIEF COMPLAINT:  Patient is resting comfortably denies any complaints. Left upper extremity swelling from hematoma is stable. Daughter at bedside.  REVIEW OF SYSTEMS:  CONSTITUTIONAL: No fever, fatigue or weakness.  EYES: No blurred or double vision.  EARS, NOSE, AND THROAT: No tinnitus or ear pain.  RESPIRATORY: No cough, shortness of breath, wheezing or hemoptysis.  CARDIOVASCULAR: No chest pain, orthopnea, edema.  GASTROINTESTINAL: No nausea, vomiting, diarrhea or abdominal pain.  GENITOURINARY: No dysuria, hematuria.  ENDOCRINE: No polyuria, nocturia,  HEMATOLOGY: No anemia, easy bruising or bleeding SKIN: No rash or lesion.left upper extremity hematoma no bleeding MUSCULOSKELETAL: No joint pain or arthritis.   NEUROLOGIC: No tingling, numbness, weakness.  PSYCHIATRY: No anxiety or depression.   DRUG ALLERGIES:  No Known Allergies  VITALS:  Blood pressure (!) 142/107, pulse (!) 105, temperature 97.9 F (36.6 C), temperature source Oral, resp. rate 20, height 5\' 11"  (1.803 m), weight 81.6 kg (180 lb), SpO2 100 %.  PHYSICAL EXAMINATION:  GENERAL:  83 y.o.-year-old patient lying in the bed with no acute distress.  EYES: Pupils equal, round, reactive to light and accommodation. No scleral icterus. Extraocular muscles intact.  HEENT: Head atraumatic, normocephalic. Oropharynx and nasopharynx clear.  NECK:  Supple, no jugular venous distention. No thyroid enlargement, no tenderness.  LUNGS: Normal breath sounds bilaterally, no wheezing, rales,rhonchi or crepitation. No use of accessory muscles of respiration.  CARDIOVASCULAR: S1, S2 normal. No murmurs, rubs, or gallops.  ABDOMEN: Soft, nontender, nondistended. Bowel sounds present. No organomegaly or mass.  EXTREMITIES:left upper extremity hematoma. No active bleeding noticed  No pedal edema, cyanosis, or clubbing.  NEUROLOGIC: Cranial nerves II through XII are intact. Muscle strength 5/5 in all extremities. Sensation intact. Gait not checked.  PSYCHIATRIC: The patient is alert and oriented x 3.  SKIN: No obvious rash, lesion, or ulcer.    LABORATORY PANEL:   CBC  Recent Labs Lab 12/13/16 0253  WBC 4.7  HGB 7.5*  HCT 21.8*  PLT 175   ------------------------------------------------------------------------------------------------------------------  Chemistries   Recent Labs Lab 12/12/16 1118 12/13/16 0253  NA 140 141  K 5.4* 4.9  CL 112* 114*  CO2 23 22  GLUCOSE 94 89  BUN 50* 51*  CREATININE 3.78* 3.44*  CALCIUM 8.7* 8.6*  AST 14*  --   ALT 10*  --   ALKPHOS 53  --   BILITOT 0.6  --    ------------------------------------------------------------------------------------------------------------------  Cardiac Enzymes No results for input(s): TROPONINI in the last 168 hours. ------------------------------------------------------------------------------------------------------------------  RADIOLOGY:  US Venous Img Upper Uni Left  Result Date: 12/12/2016 CLINICAL DATA:  83 year old male with pain, swelling and bruising left arm for 4 days. Initial encounter. EXAM: Left UPPER EXTREMITY VENOUS DOPPLER ULTRASOUND TECHNIQUE: Gray-scale sonography with graded compression, as well as color Doppler and duplex ultrasound were performed to evaluate the upper extremity deep venous system from the level of the subclavian vein and including the jugular, axillary, basilic, radial, ulnar and upper cephalic vein. Spectral Doppler was utilized to evaluate flow at rest and with distal augmentation maneuvers. COMPARISON:  None. FINDINGS: Contralateral Subclavian Vein: Respiratory phasicity is normal and symmetric with the symptomatic side. No evidence of thrombus. Normal compressibility. Internal Jugular Vein: No evidence of thrombus. Normal compressibility,  respiratory phasicity and response to augmentation. Subclavian Vein: No evidence of thrombus. Normal compressibility, respiratory phasicity and  response to augmentation. Axillary Vein: No evidence of thrombus. Normal compressibility, respiratory phasicity and response to augmentation. Cephalic Vein: No evidence of thrombus. Normal compressibility, respiratory phasicity and response to augmentation. Basilic Vein: No evidence of thrombus. Normal compressibility, respiratory phasicity and response to augmentation. Brachial Veins: No evidence of thrombus. Normal compressibility, respiratory phasicity and response to augmentation. Radial Veins: No evidence of thrombus. Normal compressibility, respiratory phasicity and response to augmentation. Ulnar Veins: No evidence of thrombus. Normal compressibility, respiratory phasicity and response to augmentation. Venous Reflux:  None visualized. Other Findings: Complex mass inner aspect left upper arm spanning over 8.9 x 3.8 x 5 cm containing complex central fluid collection spanning over 2.3 cm without central flow. IMPRESSION: No evidence of DVT within the left upper extremity. Complex mass inner aspect left upper arm spanning over 8.9 x 3.8 x 5 cm containing complex central fluid collection spanning over 2.3 cm without central flow. This may represent a hematoma given history of bruising. Infection or primary mass less likely considerations. Electronically Signed   By: Genia Del M.D.   On: 12/12/2016 12:42    EKG:   Orders placed or performed during the hospital encounter of 09/19/16  . EKG 12-Lead  . EKG 12-Lead    ASSESSMENT AND PLAN:   *ACUTE LEFT UPPER EXTREMITY HEMATOMA-Coumadin induced coagulopathy       Vit K given by ER.status post 1 unit of blood transfusion     Off Coumadin for now  * Ac on Ch anemia due to blood loss  status post blood transfusion hemoglobin 7.5 today monitor closely repeat CBC in a.m.  * CKD stage 4- stable for  now; Creatinine 4.04-3.78-3.44 Outpatient nephrology follow-up is recommended. Avoid nephrotoxins  * A fib    currently holding Coumadin in view of  left upper extremity hematoma [facility] cardiology consulted regarding anticoagulation in future   * Htn    Cont amlodipine  * HLD    Cont Rosuvastatin  * Weakness,     Not walking at home    PT eval.     All the records are reviewed and case discussed with Care Management/Social Workerr. Management plans discussed with the patient, family and they are in agreement.  CODE STATUS: DNR   TOTAL TIME TAKING CARE OF THIS PATIENT: 35 minutes.   POSSIBLE D/C IN 1-2 DAYS, DEPENDING ON CLINICAL CONDITION.  Note: This dictation was prepared with Dragon dictation along with smaller phrase technology. Any transcriptional errors that result from this process are unintentional.   Nicholes Mango M.D on 12/13/2016 at 10:00 AM  Between 7am to 6pm - Pager - [phone] After 6pm go to [email or web address] - password [email or web address] [facility]  Office  [phone]  CC: Primary care physician; Tracie Harrier, MD

## 2016-12-13 NOTE — Care Management (Addendum)
Patient admitted from home with hematoma.  Patient lives at home with wife. PCP [facility].  Pharmacy [facility].  Patient is WC bound at baseline.  Patient has a hospital bed, [place], hoyer lift in the home.  Patient has been assessed by PT and no PT follow up recommended.  Patient followed by [facility] for PCS services in the home m-f 4 hours a day.  RNCM following for discharge planning.

## 2016-12-13 NOTE — Evaluation (Signed)
Physical Therapy Evaluation Patient Details Name: Kyle Vasquez MRN: [id number] DOB: November 06, 1931 Today's Date: 12/13/2016   History of Present Illness  83 y.o. male with a known history of A fib, anemia, CKD stage4, COPD, CAD, HLD, HTN, hypothyroidism, Stroke- have left arm pain for few days.  Clinical Impression  Pt is bed bound at home with only very rare trips out of the house in w/c after dependent transfers (wither [place] or dependent SPT).  Pt with baseline confusion and insists that he can walk, however per today he was unable to even maintain sitting at EOB and multiple family members present confirm that he has not walked in well over a year.  Pt has 24/7 assist at home including an aide, does not require further PT intervention.  PT did discuss with family that his contractures (R>L) could become a problem with pressure sores/etc and to try to insure that he changes position regularly and that they try to do some minimal stretching/exercising with him as they are/he is able.  No further PT intervention is merited at this time.     Follow Up Recommendations No PT follow up    Equipment Recommendations       Recommendations for Other Services       Precautions / Restrictions Precautions Precautions: Fall Restrictions Weight Bearing Restrictions: No      Mobility  Bed Mobility Overal bed mobility: Needs Assistance Bed Mobility: Supine to Sit;Sit to Supine     Supine to sit: Max assist;Mod assist Sit to supine: Max assist   General bed mobility comments: Pt reports that he can get up on his own, but is completely unable to do so, once assisted to sitting EOB he was unable to maintain balance w/o direct physical assist  Transfers                 General transfer comment: pt uses [employer] fast streamer at home, or son does dependent transfer to w/c   Ambulation/Gait                Stairs            Wheelchair Mobility    Modified Rankin (Stroke Patients  Only)       Balance                                             Pertinent Vitals/Pain Pain Assessment:  (general diffuse pain, no specific pain identified)    Home Living Family/patient expects to be discharged to:: Private residence Living Arrangements: Spouse/significant other;Other relatives Available Help at Discharge: Family;Personal care attendant   Home Access: Ramped entrance       Home Equipment: Hospital bed;Wheelchair - [employer] - 2 wheels      Prior Function Level of Independence: Needs assistance   Gait / Transfers Assistance Needed: Pt has not walked in years, states "I can walk as much as you need me to.:"  ADL's / Homemaking Assistance Needed: Needs assist with all aspects of care, nearly bed bound - does not use commode for BM/etc        Hand Dominance        Extremity/Trunk Assessment   Upper Extremity Assessment Upper Extremity Assessment: Generalized weakness;Difficult to assess due to impaired cognition (unable to lift L (swollen) shld >70, R elevation to 100 only)    Lower Extremity Assessment Lower Extremity Assessment:  Difficult to assess due to impaired cognition (severe R knee flx contracture, less so on L, generally 2/10)       Communication   Communication: No difficulties  Cognition Arousal/Alertness: Awake/alert Behavior During Therapy: Restless Overall Cognitive Status: History of cognitive impairments - at baseline                                 General Comments: Pt confused about situation and limitations      General Comments      Exercises Other Exercises Other Exercises: performed a few reps of pulsed/sustained knee extension stretches  (R>L) and educated family on progression of contractures and generally about trying to change positions with some regularity as well as keeping him "active"   Assessment/Plan    PT Assessment Patent does not need any further PT services  PT  Problem List         PT Treatment Interventions      PT Goals (Current goals can be found in the Care Plan section)  Acute Rehab PT Goals Patient Stated Goal: unable to state PT Goal Formulation: All assessment and education complete, DC therapy    Frequency     Barriers to discharge        Co-evaluation               AM-PAC PT "6 Clicks" Daily Activity  Outcome Measure Difficulty turning over in bed (including adjusting bedclothes, sheets and blankets)?: Unable Difficulty moving from lying on back to sitting on the side of the bed? : Unable Difficulty sitting down on and standing up from a chair with arms (e.g., wheelchair, bedside commode, etc,.)?: Unable Help needed moving to and from a bed to chair (including a wheelchair)?: Total Help needed walking in hospital room?: Total Help needed climbing 3-5 steps with a railing? : Total 6 Click Score: 6    End of Session   Activity Tolerance:  (poor awareness was a limiter) Patient left: with bed alarm set;with call bell/phone within reach;with family/visitor present   PT Visit Diagnosis: Muscle weakness (generalized) (M62.81);Difficulty in walking, not elsewhere classified (R26.2)    Time: 0902-0928 PT Time Calculation (min) (ACUTE ONLY): 26 min   Charges:   PT Evaluation $PT Eval Low Complexity: 1 Low     PT G Codes:   PT G-Codes **NOT FOR INPATIENT CLASS** Functional Assessment Tool Used: AM-PAC 6 Clicks Basic Mobility Functional Limitation: Mobility: Walking and moving around Mobility: Walking and Moving Around Current Status (E7209): 100 percent impaired, limited or restricted Mobility: Walking and Moving Around Goal Status (O7096): 100 percent impaired, limited or restricted Mobility: Walking and Moving Around Discharge Status [phone]): 100 percent impaired, limited or restricted    Kreg Shropshire, DPT 12/13/2016, 10:51 AM

## 2016-12-13 NOTE — Consult Note (Signed)
Referring Physician:  Dr. Margaretmary Eddy Primary Physician Dr. Ginette Pitman Primary Cardiologist Dr. Ubaldo Glassing Reason for Consultation hematoma  HPI: Pt is a 83 yo male with history of afib treated with rate control and anticoagulation with warfarin. He has a history of aaa and pvd. He presented to the er with gradually increasing left arm pain and sweling. He had a mildly supratherapeutic inr of 3.09. He was given vitamin k and transfused due to anemia. Hgb was 6.4. Pt is a very difficult historian but denies any trauma. Pt is non ambulatotry  At home.   Review of Systems  HENT: Negative.   Eyes: Negative.   Respiratory: Negative.   Cardiovascular: Negative.   Gastrointestinal: Negative.   Genitourinary: Negative.   Musculoskeletal: Positive for myalgias.  Skin: Negative.   Neurological: Positive for weakness.  Endo/Heme/Allergies: Bruises/bleeds easily.  Psychiatric/Behavioral: Negative.     Past Medical History:  Diagnosis Date  . Anemia   . Atrial fibrillation ([place])   . Bradycardia   . Chronic kidney disease   . COPD (chronic obstructive pulmonary disease) ([place])   . Coronary artery disease   . Depression   . Heart murmur   . HLD (hyperlipidemia)   . Hypertension   . Hypothyroidism   . Lumbar myelopathy ([place])   . MDS (myelodysplastic syndrome) ([place])   . OSA (obstructive sleep apnea)   . Stroke [facility])    left sided weakness  . Syncope     Family History  Problem Relation Age of Onset  . Hypertension Mother   . Congestive Heart Failure Father   . Hypertension Father     Social History   Social History  . Marital status: Married    Spouse name: N/A  . Number of children: N/A  . Years of education: N/A   Occupational History  . Not on file.   Social History Main Topics  . Smoking status: Former Research scientist (life sciences)  . Smokeless tobacco: Former Systems developer  . Alcohol use No  . Drug use: No  . Sexual activity: Not on file   Other Topics Concern  . Not on file   Social History Narrative   Lives at home with wife, bedbound at baseline    Past Surgical History:  Procedure Laterality Date  . ABDOMINAL AORTIC ANEURYSM REPAIR    . BACK SURGERY    . CARDIAC SURGERY     mitral valve surgery  . CARDIAC VALVE REPLACEMENT    . NECK SURGERY    . PACEMAKER INSERTION Left 07/20/2016   Procedure: INSERTION PACEMAKER;  Surgeon: Isaias Cowman, MD;  Location: [facility] ORS;  Service: Cardiovascular;  Laterality: Left;     Prescriptions Prior to Admission  Medication Sig Dispense Refill Last Dose  . amLODipine (NORVASC) 5 MG tablet Take 1 tablet (5 mg total) by mouth daily. 30 tablet 0 12/11/2016 at 0800  . cyanocobalamin 500 MCG tablet Take 500 mcg by mouth 2 (two) times daily.   12/11/2016 at 2000  . HYDROcodone-acetaminophen (NORCO/VICODIN) 5-325 MG tablet Take 1-2 tablets by mouth every 4 (four) hours as needed for moderate pain. 30 tablet 0 PRN at PRN  . warfarin (COUMADIN) 3 MG tablet Take 1 tablet (3 mg total) by mouth daily. (Patient taking differently: Take 4 mg by mouth daily. ) 30 tablet 2 Past Week at Unknown time  . albuterol (PROVENTIL HFA;VENTOLIN HFA) 108 (90 Base) MCG/ACT inhaler Inhale 2 puffs into the lungs every 6 (six) hours as needed for wheezing or shortness of breath.  PRN at PRN  . levothyroxine (SYNTHROID, LEVOTHROID) 25 MCG tablet Take 1 tablet (25 mcg total) by mouth daily before breakfast. (Patient not taking: Reported on 12/12/2016) 30 tablet 0 Not Taking at 0800  . rosuvastatin (CRESTOR) 5 MG tablet Take 1 tablet (5 mg total) by mouth daily at 6 PM. (Patient not taking: Reported on 12/12/2016) 30 tablet  Not Taking at 2000    Physical Exam: Blood pressure (!) 143/84, pulse (!) 106, temperature 97.7 F (36.5 C), temperature source Oral, resp. rate 16, height 5\' 11"  (1.803 m), weight 81.6 kg (180 lb), SpO2 97 %.   Wt Readings from Last 1 Encounters:  12/12/16 81.6 kg (180 lb)     General appearance: cooperative and slowed mentation Resp: clear to auscultation  bilaterally Cardio: irregularly irregular rhythm GI: soft, non-tender; bowel sounds normal; no masses,  no organomegaly Extremities: hematoma on left arm Neurologic: Grossly normal  Labs:   Lab Results  Component Value Date   WBC 4.7 12/13/2016   HGB 7.5 (L) 12/13/2016   HCT 21.8 (L) 12/13/2016   MCV 88.9 12/13/2016   PLT 175 12/13/2016    Recent Labs Lab 12/12/16 1118 12/13/16 0253  NA 140 141  K 5.4* 4.9  CL 112* 114*  CO2 23 22  BUN 50* 51*  CREATININE 3.78* 3.44*  CALCIUM 8.7* 8.6*  PROT 6.7  --   BILITOT 0.6  --   ALKPHOS 53  --   ALT 10*  --   AST 14*  --   GLUCOSE 94 89   Lab Results  Component Value Date   CKTOTAL 154 09/19/2016   CKMB 2.0 04/30/2013   TROPONINI 0.04 (HH) 09/20/2016       EKG: afib  ASSESSMENT AND PLAN:  Pt with afib and high grade heart block s/p ppm who was admitted with developing left arm hematome. No dvt nooted. He was anemic and was mildly supratherapeutic on warfarin at 3.09. He was taken off of warfairn and given vitamin K. Given instability and anemia as well as hematome, risk of anticoagulation is higher than benefit. Would remian off of warfarin or any other anticoagulation.  Continue with amlodipine.  Signed: Teodoro Spray MD, [facility] 12/13/2016, 8:46 PM

## 2016-12-13 NOTE — Progress Notes (Signed)
Resumed care at 2300 received report from Brooke RN  

## 2016-12-14 DIAGNOSIS — Z23 Encounter for immunization: Secondary | ICD-10-CM | POA: Diagnosis not present

## 2016-12-14 LAB — CBC
HEMATOCRIT: 21.6 % — AB (ref 40.0–52.0)
HEMOGLOBIN: 7.3 g/dL — AB (ref 13.0–18.0)
MCH: 30.6 pg (ref 26.0–34.0)
MCHC: 34 g/dL (ref 32.0–36.0)
MCV: 89.9 fL (ref 80.0–100.0)
Platelets: 181 10*3/uL (ref 150–440)
RBC: 2.4 MIL/uL — ABNORMAL LOW (ref 4.40–5.90)
RDW: 16.1 % — AB (ref 11.5–14.5)
WBC: 3.9 10*3/uL (ref 3.8–10.6)

## 2016-12-14 LAB — BASIC METABOLIC PANEL
ANION GAP: 5 (ref 5–15)
BUN: 51 mg/dL — ABNORMAL HIGH (ref 6–20)
CHLORIDE: 113 mmol/L — AB (ref 101–111)
CO2: 22 mmol/L (ref 22–32)
Calcium: 8.5 mg/dL — ABNORMAL LOW (ref 8.9–10.3)
Creatinine, Ser: 3.55 mg/dL — ABNORMAL HIGH (ref 0.61–1.24)
GFR calc non Af Amer: 15 mL/min — ABNORMAL LOW (ref >60)
GFR, EST AFRICAN AMERICAN: 17 mL/min — AB (ref >60)
Glucose, Bld: 81 mg/dL (ref 65–99)
POTASSIUM: 5 mmol/L (ref 3.5–5.1)
SODIUM: 140 mmol/L (ref 135–145)

## 2016-12-14 LAB — PROTIME-INR
INR: 1.24
Prothrombin Time: 15.5 seconds — ABNORMAL HIGH (ref 11.4–15.2)

## 2016-12-14 LAB — PREPARE RBC (CROSSMATCH)

## 2016-12-14 MED ORDER — SODIUM POLYSTYRENE SULFONATE 15 GM/60ML PO SUSP
15.0000 g | Freq: Once | ORAL | Status: AC
  Administered 2016-12-14: 15 g via ORAL
  Filled 2016-12-14: qty 60

## 2016-12-14 MED ORDER — SODIUM CHLORIDE 0.9 % IV SOLN
Freq: Once | INTRAVENOUS | Status: AC
  Administered 2016-12-14: 13:00:00 via INTRAVENOUS

## 2016-12-14 MED ORDER — ACETAMINOPHEN 325 MG PO TABS: 325.0000 mg | ORAL_TABLET | Freq: Four times a day (QID) | ORAL | Status: AC | PRN

## 2016-12-14 MED ORDER — DOCUSATE SODIUM 100 MG PO CAPS: 100.0000 mg | ORAL_CAPSULE | Freq: Two times a day (BID) | ORAL | 0 refills | Status: AC | PRN

## 2016-12-14 NOTE — Discharge Instructions (Signed)
Stop Coumadin Follow-up with primary care physician in one week Follow-up with nephrologist Dr. Holley Raring at [facility] clinic in 1 week Follow-up with cardiology-[facility] 1 week-

## 2016-12-14 NOTE — Progress Notes (Signed)
Patient discharged home as ordered,instructions explained to patient and family and well understood,follow up appointments made,escorted by son and staff member via wheel chair

## 2016-12-14 NOTE — Care Management Important Message (Signed)
Important Message  Patient Details  Name: Kyle Vasquez MRN: [id number] Date of Birth: [birth year]   Medicare Important Message Given:  N/A - LOS <3 / Initial given by admissions    Beverly Sessions, RN 12/14/2016, 2:44 PM

## 2016-12-14 NOTE — Discharge Summary (Signed)
[facility] NAME: Kyle Vasquez    MR#:  [id number]  DATE OF BIRTH:  08/09/1931  DATE OF ADMISSION:  12/12/2016 ADMITTING PHYSICIAN: Vaughan Basta, MD  DATE OF DISCHARGE: 12/14/16   PRIMARY CARE PHYSICIAN: Tracie Harrier, MD    ADMISSION DIAGNOSIS:  Intramural hematoma of artery of left upper extremity, initial encounter [S45.992A]  DISCHARGE DIAGNOSIS:  Principal Problem:   Hematoma   SECONDARY DIAGNOSIS:   Past Medical History:  Diagnosis Date  . Anemia   . Atrial fibrillation ([place])   . Bradycardia   . Chronic kidney disease   . COPD (chronic obstructive pulmonary disease) ([place])   . Coronary artery disease   . Depression   . Heart murmur   . HLD (hyperlipidemia)   . Hypertension   . Hypothyroidism   . Lumbar myelopathy ([place])   . MDS (myelodysplastic syndrome) ([place])   . OSA (obstructive sleep apnea)   . Stroke [facility])    left sided weakness  . Syncope     HOSPITAL COURSE:  HISTORY OF PRESENT ILLNESS: Kyle Vasquez  is a 83 y.o. male with a known history of A fib, anemia, CKD stage4, COPD, CAD, HLD, HTN, hypothyroidism, Stroke- have left arm pain for few days. Denies fall or injuries. As per family, does not walk at baseline. On Coumadin for A fib. Noted to have echymosis on arm, sent for Doppler, have hematoma. ER spoke to Dr. Carroll Kinds- he suggest to reverse Coumadin and transfuse as Hb is low.  *ACUTE LEFT UPPER EXTREMITY HEMATOMA-Coumadin induced coagulopathy Vit K given by ER. status post 2 unit of blood transfusion  Off Coumadin  now  * Ac on Ch anemia due to blood loss status post blood transfusion hemoglobin 7.3 -Transfuse 1 unit of blood  * CKD stage 4- stable for now; Creatinine 4.04-3.78-3.44 Outpatient nephrology follow-up is recommended. Avoid nephrotoxins  * A fib currently holding Coumadin in view of  left upper extremity hematoma [facility]  cardiology consulted  regarding anticoagulation in future- as risks outweigh the benefits recommended to stop Coumadin   * Htn Cont amlodipine  * HLD Cont Rosuvastatin  * Weakness,  Not walking at home PT eval- no recommendations  DISCHARGE CONDITIONS:   Fair   CONSULTS OBTAINED:  Treatment Team:  Algernon Huxley, MD Teodoro Spray, MD Anthonette Legato, MD   PROCEDURES  None   DRUG ALLERGIES:  No Known Allergies  DISCHARGE MEDICATIONS:   Current Discharge Medication List    START taking these medications   Details  acetaminophen (TYLENOL) 325 MG tablet Take 1 tablet (325 mg total) by mouth every 6 (six) hours as needed for mild pain.    docusate sodium (COLACE) 100 MG capsule Take 1 capsule (100 mg total) by mouth 2 (two) times daily as needed for mild constipation. Qty: 10 capsule, Refills: 0      CONTINUE these medications which have NOT CHANGED   Details  amLODipine (NORVASC) 5 MG tablet Take 1 tablet (5 mg total) by mouth daily. Qty: 30 tablet, Refills: 0    cyanocobalamin 500 MCG tablet Take 500 mcg by mouth 2 (two) times daily.    HYDROcodone-acetaminophen (NORCO/VICODIN) 5-325 MG tablet Take 1-2 tablets by mouth every 4 (four) hours as needed for moderate pain. Qty: 30 tablet, Refills: 0    albuterol (PROVENTIL HFA;VENTOLIN HFA) 108 (90 Base) MCG/ACT inhaler Inhale 2 puffs into the lungs every 6 (six) hours as needed for wheezing or  shortness of breath.    levothyroxine (SYNTHROID, LEVOTHROID) 25 MCG tablet Take 1 tablet (25 mcg total) by mouth daily before breakfast. Qty: 30 tablet, Refills: 0    rosuvastatin (CRESTOR) 5 MG tablet Take 1 tablet (5 mg total) by mouth daily at 6 PM. Qty: 30 tablet      STOP taking these medications     warfarin (COUMADIN) 3 MG tablet          DISCHARGE INSTRUCTIONS:   Stop Coumadin Follow-up with primary care physician in one week Follow-up with nephrologist Dr. Holley Raring at [facility] clinic in 1 week Follow-up with  cardiology-[facility] 1 week-  DIET:  Renal diet  DISCHARGE CONDITION:  Fair  ACTIVITY:  Activity as tolerated  OXYGEN:  Home Oxygen: No.   Oxygen Delivery: room air  DISCHARGE LOCATION:  home   If you experience worsening of your admission symptoms, develop shortness of breath, life threatening emergency, suicidal or homicidal thoughts you must seek medical attention immediately by calling 911 or calling your MD immediately  if symptoms less severe.  You Must read complete instructions/literature along with all the possible adverse reactions/side effects for all the Medicines you take and that have been prescribed to you. Take any new Medicines after you have completely understood and accpet all the possible adverse reactions/side effects.   Please note  You were cared for by a hospitalist during your hospital stay. If you have any questions about your discharge medications or the care you received while you were in the hospital after you are discharged, you can call the unit and asked to speak with the hospitalist on call if the hospitalist that took care of you is not available. Once you are discharged, your primary care physician will handle any further medical issues. Please note that NO REFILLS for any discharge medications will be authorized once you are discharged, as it is imperative that you return to your primary care physician (or establish a relationship with a primary care physician if you do not have one) for your aftercare needs so that they can reassess your need for medications and monitor your lab values.     Today  Chief Complaint  Patient presents with  . Arm Pain   Patient is resting comfortably. Left hand hematoma is improving no active bleeding  ROS:  CONSTITUTIONAL: Denies fevers, chills. Denies any fatigue, weakness.  EYES: Denies blurry vision, double vision, eye pain. EARS, NOSE, THROAT: Denies tinnitus, ear pain, hearing loss. RESPIRATORY: Denies cough,  wheeze, shortness of breath.  CARDIOVASCULAR: Denies chest pain, palpitations, edema.  GASTROINTESTINAL: Denies nausea, vomiting, diarrhea, abdominal pain. Denies bright red blood per rectum. GENITOURINARY: Denies dysuria, hematuria. ENDOCRINE: Denies nocturia or thyroid problems. HEMATOLOGIC AND LYMPHATIC: Denies easy bruising or bleeding. SKIN: Denies rash or lesion. MUSCULOSKELETAL:  left upper extremity hematoma is improving , denies pain in neck, back, shoulder, knees, hips or arthritic symptoms.  NEUROLOGIC: Denies paralysis, paresthesias.  PSYCHIATRIC: Denies anxiety or depressive symptoms.   VITAL SIGNS:  Blood pressure (!) 154/74, pulse (!) 54, temperature 97.6 F (36.4 C), temperature source Oral, resp. rate 18, height 5\' 11"  (1.803 m), weight 81.6 kg (180 lb), SpO2 100 %.  I/O:    Intake/Output Summary (Last 24 hours) at 12/14/16 1356 Last data filed at 12/14/16 1006  Gross per 24 hour  Intake              480 ml  Output  0 ml  Net              480 ml    PHYSICAL EXAMINATION:  GENERAL:  83 y.o.-year-old patient lying in the bed with no acute distress.  EYES: Pupils equal, round, reactive to light and accommodation. No scleral icterus. Extraocular muscles intact.  HEENT: Head atraumatic, normocephalic. Oropharynx and nasopharynx clear.  NECK:  Supple, no jugular venous distention. No thyroid enlargement, no tenderness.  LUNGS: Normal breath sounds bilaterally, no wheezing, rales,rhonchi or crepitation. No use of accessory muscles of respiration.  CARDIOVASCULAR: S1, S2 normal. No murmurs, rubs, or gallops.  ABDOMEN: Soft, non-tender, non-distended. Bowel sounds present. No organomegaly or mass.  EXTREMITIES: Left upper extremity hematoma is resolving No pedal edema, cyanosis, or clubbing.  NEUROLOGIC: Cranial nerves II through XII are intact. PSYCHIATRIC: The patient is alert and oriented x 3.  SKIN: No obvious rash, lesion, or ulcer.   DATA REVIEW:    CBC  Recent Labs Lab 12/14/16 0521  WBC 3.9  HGB 7.3*  HCT 21.6*  PLT 181    Chemistries   Recent Labs Lab 12/12/16 1118  12/14/16 0521  NA 140  < > 140  K 5.4*  < > 5.0  CL 112*  < > 113*  CO2 23  < > 22  GLUCOSE 94  < > 81  BUN 50*  < > 51*  CREATININE 3.78*  < > 3.55*  CALCIUM 8.7*  < > 8.5*  AST 14*  --   --   ALT 10*  --   --   ALKPHOS 53  --   --   BILITOT 0.6  --   --   < > = values in this interval not displayed.  Cardiac Enzymes No results for input(s): TROPONINI in the last 168 hours.  Microbiology Results  Results for orders placed or performed during the hospital encounter of 07/18/16  Surgical pcr screen     Status: None   Collection Time: 07/18/16 12:55 PM  Result Value Ref Range Status   MRSA, PCR NEGATIVE NEGATIVE Final   Staphylococcus aureus NEGATIVE NEGATIVE Final    Comment:        The Xpert SA Assay (FDA approved for NASAL specimens in patients over 5 years of age), is one component of a comprehensive surveillance program.  Test performance has been validated by [facility] for patients greater than or equal to 4 year old. It is not intended to diagnose infection nor to guide or monitor treatment.     RADIOLOGY:  US Venous Img Upper Uni Left  Result Date: 12/12/2016 CLINICAL DATA:  83 year old male with pain, swelling and bruising left arm for 4 days. Initial encounter. EXAM: Left UPPER EXTREMITY VENOUS DOPPLER ULTRASOUND TECHNIQUE: Gray-scale sonography with graded compression, as well as color Doppler and duplex ultrasound were performed to evaluate the upper extremity deep venous system from the level of the subclavian vein and including the jugular, axillary, basilic, radial, ulnar and upper cephalic vein. Spectral Doppler was utilized to evaluate flow at rest and with distal augmentation maneuvers. COMPARISON:  None. FINDINGS: Contralateral Subclavian Vein: Respiratory phasicity is normal and symmetric with the symptomatic  side. No evidence of thrombus. Normal compressibility. Internal Jugular Vein: No evidence of thrombus. Normal compressibility, respiratory phasicity and response to augmentation. Subclavian Vein: No evidence of thrombus. Normal compressibility, respiratory phasicity and response to augmentation. Axillary Vein: No evidence of thrombus. Normal compressibility, respiratory phasicity and response to augmentation. Cephalic Vein: No evidence of  thrombus. Normal compressibility, respiratory phasicity and response to augmentation. Basilic Vein: No evidence of thrombus. Normal compressibility, respiratory phasicity and response to augmentation. Brachial Veins: No evidence of thrombus. Normal compressibility, respiratory phasicity and response to augmentation. Radial Veins: No evidence of thrombus. Normal compressibility, respiratory phasicity and response to augmentation. Ulnar Veins: No evidence of thrombus. Normal compressibility, respiratory phasicity and response to augmentation. Venous Reflux:  None visualized. Other Findings: Complex mass inner aspect left upper arm spanning over 8.9 x 3.8 x 5 cm containing complex central fluid collection spanning over 2.3 cm without central flow. IMPRESSION: No evidence of DVT within the left upper extremity. Complex mass inner aspect left upper arm spanning over 8.9 x 3.8 x 5 cm containing complex central fluid collection spanning over 2.3 cm without central flow. This may represent a hematoma given history of bruising. Infection or primary mass less likely considerations. Electronically Signed   By: Genia Del M.D.   On: 12/12/2016 12:42    EKG:   Orders placed or performed during the hospital encounter of 09/19/16  . EKG 12-Lead  . EKG 12-Lead      Management plans discussed with the patient, family and they are in agreement.  CODE STATUS:     Code Status Orders        Start     Ordered   12/12/16 1557  Do not attempt resuscitation (DNR)  Continuous     Question Answer Comment  In the event of cardiac or respiratory ARREST Do not call a "code blue"   In the event of cardiac or respiratory ARREST Do not perform Intubation, CPR, defibrillation or ACLS   In the event of cardiac or respiratory ARREST Use medication by any route, position, wound care, and other measures to relive pain and suffering. May use oxygen, suction and manual treatment of airway obstruction as needed for comfort.      12/12/16 1556    Code Status History    Date Active Date Inactive Code Status Order ID Comments User Context   09/19/2016  7:02 PM 09/21/2016  7:55 PM Full Code [id number]  Bettey Costa, MD Inpatient   07/20/2016  4:50 PM 07/21/2016  4:51 PM Full Code [id number]  Isaias Cowman, MD Inpatient   06/29/2016  7:37 PM 07/01/2016  9:03 PM Full Code [id number]  Gladstone Lighter, MD Inpatient   09/07/2015  9:31 PM 09/08/2015  7:58 PM Full Code [id number]  Quintella Baton, MD Inpatient    Advance Directive Documentation     Most Recent Value  Type of Advance Directive  Living will  Pre-existing out of facility DNR order (yellow form or pink MOST form)  -  "MOST" Form in Place?  -      TOTAL TIME TAKING CARE OF THIS PATIENT: 45 minutes.   Note: This dictation was prepared with Dragon dictation along with smaller phrase technology. Any transcriptional errors that result from this process are unintentional.   @[facility] @  on 12/14/2016 at 1:56 PM  Between 7am to 6pm - Pager - [phone]  After 6pm go to [email or web address] - password [email or web address] [facility]  Office  [phone]  CC: Primary care physician; Tracie Harrier, MD

## 2016-12-15 DIAGNOSIS — I10 Essential (primary) hypertension: Secondary | ICD-10-CM | POA: Diagnosis not present

## 2016-12-15 LAB — TYPE AND SCREEN
ABO/RH(D): A POS
Antibody Screen: NEGATIVE
UNIT DIVISION: 0
Unit division: 0

## 2016-12-15 LAB — BPAM RBC
BLOOD PRODUCT EXPIRATION DATE: 201810042359
BLOOD PRODUCT EXPIRATION DATE: 201810042359
ISSUE DATE / TIME: 201809191204
ISSUE DATE / TIME: 201809171646
Unit Type and Rh: 6200
Unit Type and Rh: 6200

## 2016-12-16 DIAGNOSIS — I10 Essential (primary) hypertension: Secondary | ICD-10-CM | POA: Diagnosis not present

## 2016-12-17 DIAGNOSIS — I10 Essential (primary) hypertension: Secondary | ICD-10-CM | POA: Diagnosis not present

## 2016-12-19 DIAGNOSIS — I10 Essential (primary) hypertension: Secondary | ICD-10-CM | POA: Diagnosis not present

## 2016-12-20 DIAGNOSIS — I10 Essential (primary) hypertension: Secondary | ICD-10-CM | POA: Diagnosis not present

## 2016-12-21 DIAGNOSIS — I10 Essential (primary) hypertension: Secondary | ICD-10-CM | POA: Diagnosis not present

## 2016-12-22 DIAGNOSIS — R55 Syncope and collapse: Secondary | ICD-10-CM | POA: Diagnosis not present

## 2016-12-22 DIAGNOSIS — E7800 Pure hypercholesterolemia, unspecified: Secondary | ICD-10-CM | POA: Diagnosis not present

## 2016-12-22 DIAGNOSIS — I495 Sick sinus syndrome: Secondary | ICD-10-CM | POA: Diagnosis not present

## 2016-12-22 DIAGNOSIS — I951 Orthostatic hypotension: Secondary | ICD-10-CM | POA: Diagnosis not present

## 2016-12-22 DIAGNOSIS — I482 Chronic atrial fibrillation: Secondary | ICD-10-CM | POA: Diagnosis not present

## 2016-12-22 DIAGNOSIS — I10 Essential (primary) hypertension: Secondary | ICD-10-CM | POA: Diagnosis not present

## 2016-12-23 DIAGNOSIS — I10 Essential (primary) hypertension: Secondary | ICD-10-CM | POA: Diagnosis not present

## 2016-12-24 DIAGNOSIS — I10 Essential (primary) hypertension: Secondary | ICD-10-CM | POA: Diagnosis not present

## 2016-12-26 DIAGNOSIS — I10 Essential (primary) hypertension: Secondary | ICD-10-CM | POA: Diagnosis not present

## 2016-12-27 DIAGNOSIS — I10 Essential (primary) hypertension: Secondary | ICD-10-CM | POA: Diagnosis not present

## 2016-12-27 DIAGNOSIS — Z7901 Long term (current) use of anticoagulants: Secondary | ICD-10-CM | POA: Diagnosis not present

## 2016-12-28 DIAGNOSIS — R32 Unspecified urinary incontinence: Secondary | ICD-10-CM | POA: Diagnosis not present

## 2016-12-28 DIAGNOSIS — I10 Essential (primary) hypertension: Secondary | ICD-10-CM | POA: Diagnosis not present

## 2016-12-28 DIAGNOSIS — S020XXA Fracture of vault of skull, initial encounter for closed fracture: Secondary | ICD-10-CM | POA: Diagnosis not present

## 2016-12-29 DIAGNOSIS — I10 Essential (primary) hypertension: Secondary | ICD-10-CM | POA: Diagnosis not present

## 2016-12-30 DIAGNOSIS — I10 Essential (primary) hypertension: Secondary | ICD-10-CM | POA: Diagnosis not present

## 2017-01-02 DIAGNOSIS — I10 Essential (primary) hypertension: Secondary | ICD-10-CM | POA: Diagnosis not present

## 2017-01-03 DIAGNOSIS — I10 Essential (primary) hypertension: Secondary | ICD-10-CM | POA: Diagnosis not present

## 2017-01-04 DIAGNOSIS — Z09 Encounter for follow-up examination after completed treatment for conditions other than malignant neoplasm: Secondary | ICD-10-CM | POA: Diagnosis not present

## 2017-01-04 DIAGNOSIS — I482 Chronic atrial fibrillation: Secondary | ICD-10-CM | POA: Diagnosis not present

## 2017-01-04 DIAGNOSIS — Z8673 Personal history of transient ischemic attack (TIA), and cerebral infarction without residual deficits: Secondary | ICD-10-CM | POA: Diagnosis not present

## 2017-01-04 DIAGNOSIS — I10 Essential (primary) hypertension: Secondary | ICD-10-CM | POA: Diagnosis not present

## 2017-01-04 DIAGNOSIS — Z23 Encounter for immunization: Secondary | ICD-10-CM | POA: Diagnosis not present

## 2017-01-04 DIAGNOSIS — J42 Unspecified chronic bronchitis: Secondary | ICD-10-CM | POA: Diagnosis not present

## 2017-01-04 DIAGNOSIS — N183 Chronic kidney disease, stage 3 (moderate): Secondary | ICD-10-CM | POA: Diagnosis not present

## 2017-01-04 DIAGNOSIS — D649 Anemia, unspecified: Secondary | ICD-10-CM | POA: Diagnosis not present

## 2017-01-04 DIAGNOSIS — I495 Sick sinus syndrome: Secondary | ICD-10-CM | POA: Diagnosis not present

## 2017-01-04 DIAGNOSIS — G959 Disease of spinal cord, unspecified: Secondary | ICD-10-CM | POA: Diagnosis not present

## 2017-01-05 DIAGNOSIS — I10 Essential (primary) hypertension: Secondary | ICD-10-CM | POA: Diagnosis not present

## 2017-01-05 DIAGNOSIS — N184 Chronic kidney disease, stage 4 (severe): Secondary | ICD-10-CM | POA: Diagnosis not present

## 2017-01-06 DIAGNOSIS — I10 Essential (primary) hypertension: Secondary | ICD-10-CM | POA: Diagnosis not present

## 2017-01-09 DIAGNOSIS — I10 Essential (primary) hypertension: Secondary | ICD-10-CM | POA: Diagnosis not present

## 2017-01-10 DIAGNOSIS — Z23 Encounter for immunization: Secondary | ICD-10-CM | POA: Diagnosis not present

## 2017-01-10 DIAGNOSIS — I2510 Atherosclerotic heart disease of native coronary artery without angina pectoris: Secondary | ICD-10-CM | POA: Diagnosis not present

## 2017-01-10 DIAGNOSIS — G959 Disease of spinal cord, unspecified: Secondary | ICD-10-CM | POA: Diagnosis not present

## 2017-01-10 DIAGNOSIS — N189 Chronic kidney disease, unspecified: Secondary | ICD-10-CM | POA: Diagnosis not present

## 2017-01-10 DIAGNOSIS — I495 Sick sinus syndrome: Secondary | ICD-10-CM | POA: Diagnosis not present

## 2017-01-10 DIAGNOSIS — D631 Anemia in chronic kidney disease: Secondary | ICD-10-CM | POA: Diagnosis not present

## 2017-01-10 DIAGNOSIS — D649 Anemia, unspecified: Secondary | ICD-10-CM | POA: Diagnosis not present

## 2017-01-10 DIAGNOSIS — I482 Chronic atrial fibrillation: Secondary | ICD-10-CM | POA: Diagnosis not present

## 2017-01-10 DIAGNOSIS — I10 Essential (primary) hypertension: Secondary | ICD-10-CM | POA: Diagnosis not present

## 2017-01-10 DIAGNOSIS — I69354 Hemiplegia and hemiparesis following cerebral infarction affecting left non-dominant side: Secondary | ICD-10-CM | POA: Diagnosis not present

## 2017-01-10 DIAGNOSIS — J42 Unspecified chronic bronchitis: Secondary | ICD-10-CM | POA: Diagnosis not present

## 2017-01-10 DIAGNOSIS — Z8673 Personal history of transient ischemic attack (TIA), and cerebral infarction without residual deficits: Secondary | ICD-10-CM | POA: Diagnosis not present

## 2017-01-10 DIAGNOSIS — R8290 Unspecified abnormal findings in urine: Secondary | ICD-10-CM | POA: Diagnosis not present

## 2017-01-10 DIAGNOSIS — N183 Chronic kidney disease, stage 3 (moderate): Secondary | ICD-10-CM | POA: Diagnosis not present

## 2017-01-10 DIAGNOSIS — I50810 Right heart failure, unspecified: Secondary | ICD-10-CM | POA: Diagnosis not present

## 2017-01-10 DIAGNOSIS — Z09 Encounter for follow-up examination after completed treatment for conditions other than malignant neoplasm: Secondary | ICD-10-CM | POA: Diagnosis not present

## 2017-01-10 DIAGNOSIS — J449 Chronic obstructive pulmonary disease, unspecified: Secondary | ICD-10-CM | POA: Diagnosis not present

## 2017-01-10 DIAGNOSIS — I130 Hypertensive heart and chronic kidney disease with heart failure and stage 1 through stage 4 chronic kidney disease, or unspecified chronic kidney disease: Secondary | ICD-10-CM | POA: Diagnosis not present

## 2017-01-11 DIAGNOSIS — I10 Essential (primary) hypertension: Secondary | ICD-10-CM | POA: Diagnosis not present

## 2017-01-12 DIAGNOSIS — I10 Essential (primary) hypertension: Secondary | ICD-10-CM | POA: Diagnosis not present

## 2017-01-13 DIAGNOSIS — I10 Essential (primary) hypertension: Secondary | ICD-10-CM | POA: Diagnosis not present

## 2017-01-16 DIAGNOSIS — I10 Essential (primary) hypertension: Secondary | ICD-10-CM | POA: Diagnosis not present

## 2017-01-17 DIAGNOSIS — I10 Essential (primary) hypertension: Secondary | ICD-10-CM | POA: Diagnosis not present

## 2017-01-18 DIAGNOSIS — I130 Hypertensive heart and chronic kidney disease with heart failure and stage 1 through stage 4 chronic kidney disease, or unspecified chronic kidney disease: Secondary | ICD-10-CM | POA: Diagnosis not present

## 2017-01-18 DIAGNOSIS — I482 Chronic atrial fibrillation: Secondary | ICD-10-CM | POA: Diagnosis not present

## 2017-01-18 DIAGNOSIS — I69354 Hemiplegia and hemiparesis following cerebral infarction affecting left non-dominant side: Secondary | ICD-10-CM | POA: Diagnosis not present

## 2017-01-18 DIAGNOSIS — I50810 Right heart failure, unspecified: Secondary | ICD-10-CM | POA: Diagnosis not present

## 2017-01-18 DIAGNOSIS — J449 Chronic obstructive pulmonary disease, unspecified: Secondary | ICD-10-CM | POA: Diagnosis not present

## 2017-01-18 DIAGNOSIS — D631 Anemia in chronic kidney disease: Secondary | ICD-10-CM | POA: Diagnosis not present

## 2017-01-18 DIAGNOSIS — I2510 Atherosclerotic heart disease of native coronary artery without angina pectoris: Secondary | ICD-10-CM | POA: Diagnosis not present

## 2017-01-18 DIAGNOSIS — G959 Disease of spinal cord, unspecified: Secondary | ICD-10-CM | POA: Diagnosis not present

## 2017-01-18 DIAGNOSIS — I4891 Unspecified atrial fibrillation: Secondary | ICD-10-CM | POA: Diagnosis not present

## 2017-01-18 DIAGNOSIS — N189 Chronic kidney disease, unspecified: Secondary | ICD-10-CM | POA: Diagnosis not present

## 2017-01-18 DIAGNOSIS — I10 Essential (primary) hypertension: Secondary | ICD-10-CM | POA: Diagnosis not present

## 2017-01-19 DIAGNOSIS — I10 Essential (primary) hypertension: Secondary | ICD-10-CM | POA: Diagnosis not present

## 2017-01-20 DIAGNOSIS — I10 Essential (primary) hypertension: Secondary | ICD-10-CM | POA: Diagnosis not present

## 2017-01-21 DIAGNOSIS — I10 Essential (primary) hypertension: Secondary | ICD-10-CM | POA: Diagnosis not present

## 2017-01-23 DIAGNOSIS — I10 Essential (primary) hypertension: Secondary | ICD-10-CM | POA: Diagnosis not present

## 2017-01-24 DIAGNOSIS — I10 Essential (primary) hypertension: Secondary | ICD-10-CM | POA: Diagnosis not present

## 2017-01-25 DIAGNOSIS — I482 Chronic atrial fibrillation: Secondary | ICD-10-CM | POA: Diagnosis not present

## 2017-01-25 DIAGNOSIS — I2510 Atherosclerotic heart disease of native coronary artery without angina pectoris: Secondary | ICD-10-CM | POA: Diagnosis not present

## 2017-01-25 DIAGNOSIS — I130 Hypertensive heart and chronic kidney disease with heart failure and stage 1 through stage 4 chronic kidney disease, or unspecified chronic kidney disease: Secondary | ICD-10-CM | POA: Diagnosis not present

## 2017-01-25 DIAGNOSIS — G959 Disease of spinal cord, unspecified: Secondary | ICD-10-CM | POA: Diagnosis not present

## 2017-01-25 DIAGNOSIS — I50810 Right heart failure, unspecified: Secondary | ICD-10-CM | POA: Diagnosis not present

## 2017-01-25 DIAGNOSIS — I10 Essential (primary) hypertension: Secondary | ICD-10-CM | POA: Diagnosis not present

## 2017-01-25 DIAGNOSIS — N189 Chronic kidney disease, unspecified: Secondary | ICD-10-CM | POA: Diagnosis not present

## 2017-01-25 DIAGNOSIS — D631 Anemia in chronic kidney disease: Secondary | ICD-10-CM | POA: Diagnosis not present

## 2017-01-25 DIAGNOSIS — I69354 Hemiplegia and hemiparesis following cerebral infarction affecting left non-dominant side: Secondary | ICD-10-CM | POA: Diagnosis not present

## 2017-01-25 DIAGNOSIS — J449 Chronic obstructive pulmonary disease, unspecified: Secondary | ICD-10-CM | POA: Diagnosis not present

## 2017-01-26 DIAGNOSIS — R32 Unspecified urinary incontinence: Secondary | ICD-10-CM | POA: Diagnosis not present

## 2017-01-26 DIAGNOSIS — I10 Essential (primary) hypertension: Secondary | ICD-10-CM | POA: Diagnosis not present

## 2017-01-26 DIAGNOSIS — S020XXA Fracture of vault of skull, initial encounter for closed fracture: Secondary | ICD-10-CM | POA: Diagnosis not present

## 2017-01-27 ENCOUNTER — Observation Stay
Admission: EM | Admit: 2017-01-27 | Discharge: 2017-01-28 | Disposition: A | Discharge status: Home / Self Care | Payer: Medicare HMO | Attending: Internal Medicine | Admitting: Internal Medicine

## 2017-01-27 ENCOUNTER — Emergency Department: Payer: Medicare HMO

## 2017-01-27 DIAGNOSIS — I4581 Long QT syndrome: Secondary | ICD-10-CM | POA: Diagnosis not present

## 2017-01-27 DIAGNOSIS — I714 Abdominal aortic aneurysm, without rupture: Secondary | ICD-10-CM | POA: Diagnosis not present

## 2017-01-27 DIAGNOSIS — Z1611 Resistance to penicillins: Secondary | ICD-10-CM | POA: Diagnosis not present

## 2017-01-27 DIAGNOSIS — N184 Chronic kidney disease, stage 4 (severe): Secondary | ICD-10-CM | POA: Diagnosis not present

## 2017-01-27 DIAGNOSIS — R55 Syncope and collapse: Secondary | ICD-10-CM | POA: Diagnosis not present

## 2017-01-27 DIAGNOSIS — N179 Acute kidney failure, unspecified: Secondary | ICD-10-CM | POA: Insufficient documentation

## 2017-01-27 DIAGNOSIS — Z950 Presence of cardiac pacemaker: Secondary | ICD-10-CM | POA: Insufficient documentation

## 2017-01-27 DIAGNOSIS — R911 Solitary pulmonary nodule: Secondary | ICD-10-CM | POA: Insufficient documentation

## 2017-01-27 DIAGNOSIS — Z952 Presence of prosthetic heart valve: Secondary | ICD-10-CM | POA: Insufficient documentation

## 2017-01-27 DIAGNOSIS — I10 Essential (primary) hypertension: Secondary | ICD-10-CM | POA: Diagnosis not present

## 2017-01-27 DIAGNOSIS — Z7401 Bed confinement status: Secondary | ICD-10-CM | POA: Insufficient documentation

## 2017-01-27 DIAGNOSIS — Z7901 Long term (current) use of anticoagulants: Secondary | ICD-10-CM | POA: Insufficient documentation

## 2017-01-27 DIAGNOSIS — B964 Proteus (mirabilis) (morganii) as the cause of diseases classified elsewhere: Secondary | ICD-10-CM | POA: Diagnosis not present

## 2017-01-27 DIAGNOSIS — R791 Abnormal coagulation profile: Secondary | ICD-10-CM | POA: Diagnosis not present

## 2017-01-27 DIAGNOSIS — I130 Hypertensive heart and chronic kidney disease with heart failure and stage 1 through stage 4 chronic kidney disease, or unspecified chronic kidney disease: Secondary | ICD-10-CM | POA: Diagnosis not present

## 2017-01-27 DIAGNOSIS — G959 Disease of spinal cord, unspecified: Secondary | ICD-10-CM | POA: Diagnosis not present

## 2017-01-27 DIAGNOSIS — I739 Peripheral vascular disease, unspecified: Secondary | ICD-10-CM | POA: Insufficient documentation

## 2017-01-27 DIAGNOSIS — R531 Weakness: Secondary | ICD-10-CM | POA: Diagnosis not present

## 2017-01-27 DIAGNOSIS — E785 Hyperlipidemia, unspecified: Secondary | ICD-10-CM | POA: Diagnosis not present

## 2017-01-27 DIAGNOSIS — B961 Klebsiella pneumoniae [K. pneumoniae] as the cause of diseases classified elsewhere: Secondary | ICD-10-CM | POA: Insufficient documentation

## 2017-01-27 DIAGNOSIS — Z1639 Resistance to other specified antimicrobial drug: Secondary | ICD-10-CM | POA: Diagnosis not present

## 2017-01-27 DIAGNOSIS — J449 Chronic obstructive pulmonary disease, unspecified: Secondary | ICD-10-CM | POA: Diagnosis not present

## 2017-01-27 DIAGNOSIS — Z79899 Other long term (current) drug therapy: Secondary | ICD-10-CM | POA: Insufficient documentation

## 2017-01-27 DIAGNOSIS — I4891 Unspecified atrial fibrillation: Secondary | ICD-10-CM | POA: Insufficient documentation

## 2017-01-27 DIAGNOSIS — I495 Sick sinus syndrome: Secondary | ICD-10-CM | POA: Insufficient documentation

## 2017-01-27 DIAGNOSIS — I509 Heart failure, unspecified: Secondary | ICD-10-CM | POA: Diagnosis not present

## 2017-01-27 DIAGNOSIS — R5383 Other fatigue: Secondary | ICD-10-CM | POA: Diagnosis not present

## 2017-01-27 DIAGNOSIS — R011 Cardiac murmur, unspecified: Secondary | ICD-10-CM | POA: Diagnosis not present

## 2017-01-27 DIAGNOSIS — G4733 Obstructive sleep apnea (adult) (pediatric): Secondary | ICD-10-CM | POA: Insufficient documentation

## 2017-01-27 DIAGNOSIS — J90 Pleural effusion, not elsewhere classified: Secondary | ICD-10-CM | POA: Insufficient documentation

## 2017-01-27 DIAGNOSIS — Z87891 Personal history of nicotine dependence: Secondary | ICD-10-CM | POA: Insufficient documentation

## 2017-01-27 DIAGNOSIS — R079 Chest pain, unspecified: Secondary | ICD-10-CM | POA: Diagnosis not present

## 2017-01-27 DIAGNOSIS — D469 Myelodysplastic syndrome, unspecified: Secondary | ICD-10-CM | POA: Diagnosis not present

## 2017-01-27 DIAGNOSIS — D631 Anemia in chronic kidney disease: Secondary | ICD-10-CM | POA: Insufficient documentation

## 2017-01-27 DIAGNOSIS — Z8249 Family history of ischemic heart disease and other diseases of the circulatory system: Secondary | ICD-10-CM | POA: Insufficient documentation

## 2017-01-27 DIAGNOSIS — Z8679 Personal history of other diseases of the circulatory system: Secondary | ICD-10-CM | POA: Insufficient documentation

## 2017-01-27 DIAGNOSIS — Z7982 Long term (current) use of aspirin: Secondary | ICD-10-CM | POA: Insufficient documentation

## 2017-01-27 DIAGNOSIS — N390 Urinary tract infection, site not specified: Principal | ICD-10-CM | POA: Diagnosis present

## 2017-01-27 DIAGNOSIS — Z993 Dependence on wheelchair: Secondary | ICD-10-CM | POA: Insufficient documentation

## 2017-01-27 DIAGNOSIS — F329 Major depressive disorder, single episode, unspecified: Secondary | ICD-10-CM | POA: Diagnosis not present

## 2017-01-27 DIAGNOSIS — E039 Hypothyroidism, unspecified: Secondary | ICD-10-CM | POA: Insufficient documentation

## 2017-01-27 DIAGNOSIS — I2510 Atherosclerotic heart disease of native coronary artery without angina pectoris: Secondary | ICD-10-CM | POA: Diagnosis not present

## 2017-01-27 DIAGNOSIS — R0602 Shortness of breath: Secondary | ICD-10-CM | POA: Diagnosis not present

## 2017-01-27 DIAGNOSIS — R0789 Other chest pain: Secondary | ICD-10-CM | POA: Diagnosis not present

## 2017-01-27 DIAGNOSIS — I69354 Hemiplegia and hemiparesis following cerebral infarction affecting left non-dominant side: Secondary | ICD-10-CM | POA: Insufficient documentation

## 2017-01-27 LAB — CBC WITH DIFFERENTIAL/PLATELET
BASOS ABS: 0.1 10*3/uL (ref 0–0.1)
Basophils Relative: 3 %
EOS PCT: 5 %
Eosinophils Absolute: 0.2 10*3/uL (ref 0–0.7)
HEMATOCRIT: 25.4 % — AB (ref 40.0–52.0)
Hemoglobin: 8.3 g/dL — ABNORMAL LOW (ref 13.0–18.0)
LYMPHS PCT: 20 %
Lymphs Abs: 0.7 10*3/uL — ABNORMAL LOW (ref 1.0–3.6)
MCH: 30 pg (ref 26.0–34.0)
MCHC: 32.6 g/dL (ref 32.0–36.0)
MCV: 91.9 fL (ref 80.0–100.0)
MONO ABS: 0.3 10*3/uL (ref 0.2–1.0)
MONOS PCT: 9 %
Neutro Abs: 2.1 10*3/uL (ref 1.4–6.5)
Neutrophils Relative %: 63 %
PLATELETS: 165 10*3/uL (ref 150–440)
RBC: 2.77 MIL/uL — ABNORMAL LOW (ref 4.40–5.90)
RDW: 16.6 % — AB (ref 11.5–14.5)
WBC: 3.3 10*3/uL — ABNORMAL LOW (ref 3.8–10.6)

## 2017-01-27 LAB — URINALYSIS, COMPLETE (UACMP) WITH MICROSCOPIC
BILIRUBIN URINE: NEGATIVE
GLUCOSE, UA: NEGATIVE mg/dL
KETONES UR: NEGATIVE mg/dL
NITRITE: NEGATIVE
PROTEIN: 100 mg/dL — AB
Specific Gravity, Urine: 1.011 (ref 1.005–1.030)
pH: 5 (ref 5.0–8.0)

## 2017-01-27 LAB — PROTIME-INR
INR: 3.82
Prothrombin Time: 37.3 seconds — ABNORMAL HIGH (ref 11.4–15.2)

## 2017-01-27 LAB — COMPREHENSIVE METABOLIC PANEL
ALK PHOS: 67 U/L (ref 38–126)
ALT: 15 U/L — AB (ref 17–63)
ANION GAP: 5 (ref 5–15)
AST: 23 U/L (ref 15–41)
Albumin: 3.1 g/dL — ABNORMAL LOW (ref 3.5–5.0)
BILIRUBIN TOTAL: 0.7 mg/dL (ref 0.3–1.2)
BUN: 60 mg/dL — ABNORMAL HIGH (ref 6–20)
CO2: 19 mmol/L — AB (ref 22–32)
Calcium: 8.9 mg/dL (ref 8.9–10.3)
Chloride: 117 mmol/L — ABNORMAL HIGH (ref 101–111)
Creatinine, Ser: 3.74 mg/dL — ABNORMAL HIGH (ref 0.61–1.24)
GFR, EST AFRICAN AMERICAN: 16 mL/min — AB (ref >60)
GFR, EST NON AFRICAN AMERICAN: 14 mL/min — AB (ref >60)
Glucose, Bld: 80 mg/dL (ref 65–99)
Potassium: 4.9 mmol/L (ref 3.5–5.1)
SODIUM: 141 mmol/L (ref 135–145)
Total Protein: 6.9 g/dL (ref 6.5–8.1)

## 2017-01-27 LAB — TROPONIN I
TROPONIN I: 0.04 ng/mL — AB (ref <0.03)
Troponin I: 0.04 ng/mL (ref <0.03)
Troponin I: 0.04 ng/mL (ref <0.03)

## 2017-01-27 MED ORDER — AMLODIPINE BESYLATE 5 MG PO TABS
5.0000 mg | ORAL_TABLET | Freq: Once | ORAL | Status: AC
  Administered 2017-01-27: 5 mg via ORAL
  Filled 2017-01-27: qty 1

## 2017-01-27 MED ORDER — DOCUSATE SODIUM 100 MG PO CAPS
100.0000 mg | ORAL_CAPSULE | Freq: Two times a day (BID) | ORAL | Status: DC
  Administered 2017-01-27 – 2017-01-28 (×2): 100 mg via ORAL
  Filled 2017-01-27 (×2): qty 1

## 2017-01-27 MED ORDER — FUROSEMIDE 10 MG/ML IJ SOLN
40.0000 mg | Freq: Once | INTRAMUSCULAR | Status: AC
  Administered 2017-01-27: 40 mg via INTRAVENOUS
  Filled 2017-01-27: qty 4

## 2017-01-27 MED ORDER — CEFTRIAXONE SODIUM IN DEXTROSE 20 MG/ML IV SOLN
1.0000 g | Freq: Once | INTRAVENOUS | Status: AC
  Administered 2017-01-27: 1 g via INTRAVENOUS
  Filled 2017-01-27: qty 50

## 2017-01-27 MED ORDER — LABETALOL HCL 5 MG/ML IV SOLN
20.0000 mg | Freq: Once | INTRAVENOUS | Status: AC
  Administered 2017-01-27: 20 mg via INTRAVENOUS
  Filled 2017-01-27 (×2): qty 4

## 2017-01-27 MED ORDER — MOMETASONE FURO-FORMOTEROL FUM 200-5 MCG/ACT IN AERO
2.0000 | INHALATION_SPRAY | Freq: Two times a day (BID) | RESPIRATORY_TRACT | Status: DC
  Administered 2017-01-27 – 2017-01-28 (×2): 2 via RESPIRATORY_TRACT
  Filled 2017-01-27: qty 8.8

## 2017-01-27 MED ORDER — CEFTRIAXONE SODIUM IN DEXTROSE 20 MG/ML IV SOLN: 1.0000 g | INTRAVENOUS | Status: DC

## 2017-01-27 MED ORDER — ACETAMINOPHEN 650 MG RE SUPP: 650.0000 mg | Freq: Four times a day (QID) | RECTAL | Status: DC | PRN

## 2017-01-27 MED ORDER — ACETAMINOPHEN 325 MG PO TABS: 325.0000 mg | ORAL_TABLET | Freq: Four times a day (QID) | ORAL | Status: DC | PRN

## 2017-01-27 MED ORDER — ADULT MULTIVITAMIN W/MINERALS CH
1.0000 | ORAL_TABLET | Freq: Every day | ORAL | Status: DC
  Administered 2017-01-27 – 2017-01-28 (×2): 1 via ORAL
  Filled 2017-01-27 (×2): qty 1

## 2017-01-27 MED ORDER — ALBUTEROL SULFATE (2.5 MG/3ML) 0.083% IN NEBU: 2.5000 mg | INHALATION_SOLUTION | Freq: Four times a day (QID) | RESPIRATORY_TRACT | Status: DC | PRN

## 2017-01-27 MED ORDER — DOCUSATE SODIUM 100 MG PO CAPS: 100.0000 mg | ORAL_CAPSULE | Freq: Two times a day (BID) | ORAL | Status: DC | PRN

## 2017-01-27 MED ORDER — ASPIRIN EC 81 MG PO TBEC
81.0000 mg | DELAYED_RELEASE_TABLET | Freq: Every day | ORAL | Status: DC
  Administered 2017-01-27 – 2017-01-28 (×2): 81 mg via ORAL
  Filled 2017-01-27 (×2): qty 1

## 2017-01-27 MED ORDER — VITAMIN B-12 1000 MCG PO TABS
500.0000 ug | ORAL_TABLET | Freq: Two times a day (BID) | ORAL | Status: DC
  Administered 2017-01-27 – 2017-01-28 (×2): 500 ug via ORAL
  Filled 2017-01-27 (×2): qty 1

## 2017-01-27 MED ORDER — SODIUM CHLORIDE 0.9 % IV SOLN
INTRAVENOUS | Status: DC
  Administered 2017-01-27 – 2017-01-28 (×2): via INTRAVENOUS

## 2017-01-27 MED ORDER — ONDANSETRON HCL 4 MG PO TABS: 4.0000 mg | ORAL_TABLET | Freq: Four times a day (QID) | ORAL | Status: DC | PRN

## 2017-01-27 MED ORDER — LEVOTHYROXINE SODIUM 25 MCG PO TABS
25.0000 ug | ORAL_TABLET | Freq: Every day | ORAL | Status: DC
  Administered 2017-01-28: 09:00:00 25 ug via ORAL
  Filled 2017-01-27: qty 1

## 2017-01-27 MED ORDER — TRAZODONE HCL 50 MG PO TABS: 25.0000 mg | ORAL_TABLET | Freq: Every evening | ORAL | Status: DC | PRN

## 2017-01-27 MED ORDER — ROSUVASTATIN CALCIUM 10 MG PO TABS
5.0000 mg | ORAL_TABLET | Freq: Every day | ORAL | Status: DC
  Administered 2017-01-27: 5 mg via ORAL
  Filled 2017-01-27: qty 1

## 2017-01-27 MED ORDER — INFLUENZA VAC SPLIT HIGH-DOSE 0.5 ML IM SUSY
0.5000 mL | PREFILLED_SYRINGE | INTRAMUSCULAR | Status: DC
  Filled 2017-01-27: qty 0.5

## 2017-01-27 MED ORDER — ONDANSETRON HCL 4 MG/2ML IJ SOLN: 4.0000 mg | Freq: Four times a day (QID) | INTRAMUSCULAR | Status: DC | PRN

## 2017-01-27 MED ORDER — ACETAMINOPHEN 325 MG PO TABS: 650.0000 mg | ORAL_TABLET | Freq: Four times a day (QID) | ORAL | Status: DC | PRN

## 2017-01-27 MED ORDER — BISACODYL 5 MG PO TBEC: 5.0000 mg | DELAYED_RELEASE_TABLET | Freq: Every day | ORAL | Status: DC | PRN

## 2017-01-27 MED ORDER — DEXTROSE 5 % IV SOLN
INTRAVENOUS | Status: AC
  Filled 2017-01-27: qty 1

## 2017-01-27 MED ORDER — CEFTRIAXONE SODIUM IN DEXTROSE 20 MG/ML IV SOLN
1.0000 g | INTRAVENOUS | Status: DC
  Filled 2017-01-27: qty 50

## 2017-01-27 NOTE — Progress Notes (Signed)
ANTIBIOTIC CONSULT NOTE - INITIAL  Pharmacy Consult for Ceftriaxone  Indication: UTI  No Known Allergies  Patient Measurements: Height: 6\' 5"  (269.4 cm) Weight: 190 lb (86.2 kg) IBW/kg (Calculated) : 89.1 Adjusted Body Weight:   Vital Signs: Temp: 97.5 F (36.4 C) (11/02 1719) Temp Source: Oral (11/02 1719) BP: 133/99 (11/02 2030) Pulse Rate: 69 (11/02 1719) Intake/Output from previous day: No intake/output data recorded. Intake/Output from this shift: No intake/output data recorded.  Labs:  Recent Labs  01/27/17 1222  WBC 3.3*  HGB 8.3*  PLT 165  CREATININE 3.74*   Estimated Creatinine Clearance: 18.2 mL/min (A) (by C-G formula based on SCr of 3.74 mg/dL (H)). No results for input(s): VANCOTROUGH, VANCOPEAK, VANCORANDOM, GENTTROUGH, GENTPEAK, GENTRANDOM, TOBRATROUGH, TOBRAPEAK, TOBRARND, AMIKACINPEAK, AMIKACINTROU, AMIKACIN in the last 72 hours.   Microbiology: No results found for this or any previous visit (from the past 720 hour(s)).  Medical History: Past Medical History:  Diagnosis Date  . Anemia   . Atrial fibrillation ([place])   . Bradycardia   . Chronic kidney disease   . COPD (chronic obstructive pulmonary disease) ([place])   . Coronary artery disease   . Depression   . Heart murmur   . HLD (hyperlipidemia)   . Hypertension   . Hypothyroidism   . Lumbar myelopathy ([place])   . MDS (myelodysplastic syndrome) ([place])   . OSA (obstructive sleep apnea)   . Stroke [facility])    left sided weakness  . Syncope     Medications:   (Not in a hospital admission) Assessment: CrCl = 18.2 ml/min   Goal of Therapy:  resolution of infection  Plan:  Expected duration 7 days with resolution of temperature and/or normalization of WBC   Ceftriaxone 1 gm IV Q24H ordered to start on 11/2 @ 22:00.   Kyle Vasquez D 01/27/2017,9:21 PM

## 2017-01-27 NOTE — ED Provider Notes (Signed)
[facility] Emergency Department Provider Note  ____________________________________________   First MD Initiated Contact with Patient 01/27/17 1221     (approximate)  I have reviewed the triage vital signs and the nursing notes.   HISTORY  Chief Complaint Fatigue (Reports of chest pain over the last week)   HPI Kyle Vasquez is a 83 y.o. male with a history of atrial fibrillation on Coumadin who is presenting to the emergency department today with chest pain.  The patient is symptom-free today.  He says he has no complaints and feels well.  However, his healthcare provider has been reporting that the patient has been having chest pain over the past 2-3 days with worsening weakness.  Patient says that he has had chest pain to the right side of his chest over the past several days.  However, is denying any chest pain today.  Denies any shortness of breath, nausea or vomiting.  He is unable to describe the exact character of the chest pain, such as if it was burning or sharp.  However, he does state that it has radiated through to his back.  Past Medical History:  Diagnosis Date  . Anemia   . Atrial fibrillation ([place])   . Bradycardia   . Chronic kidney disease   . COPD (chronic obstructive pulmonary disease) ([place])   . Coronary artery disease   . Depression   . Heart murmur   . HLD (hyperlipidemia)   . Hypertension   . Hypothyroidism   . Lumbar myelopathy ([place])   . MDS (myelodysplastic syndrome) ([place])   . OSA (obstructive sleep apnea)   . Stroke [facility])    left sided weakness  . Syncope     Patient Active Problem List   Diagnosis Date Noted  . Hematoma 12/12/2016  . Carotid atherosclerosis, bilateral 11/25/2016  . PVD (peripheral vascular disease) ([place]) 11/25/2016  . AAA (abdominal aortic aneurysm) without rupture ([place]) 11/25/2016  . Elevated troponin 09/20/2016  . Sick sinus syndrome ([place]) 07/20/2016  . Syncope 06/29/2016  . ARF (acute renal  failure) ([place]) 06/29/2016  . Bradycardia 09/08/2015  . Symptomatic bradycardia 09/07/2015  . Syncope and collapse 09/07/2015  . Atrial fibrillation ([place]) 09/07/2015  . HTN (hypertension) 09/07/2015  . Physical deconditioning 09/07/2015  . H/O: CVA (cerebrovascular accident) 09/07/2015    Past Surgical History:  Procedure Laterality Date  . ABDOMINAL AORTIC ANEURYSM REPAIR    . BACK SURGERY    . CARDIAC SURGERY     mitral valve surgery  . CARDIAC VALVE REPLACEMENT    . NECK SURGERY    . PACEMAKER INSERTION Left 07/20/2016   Procedure: INSERTION PACEMAKER;  Surgeon: Isaias Cowman, MD;  Location: [facility] ORS;  Service: Cardiovascular;  Laterality: Left;    Prior to Admission medications   Medication Sig Start Date End Date Taking? Authorizing Provider  acetaminophen (TYLENOL) 325 MG tablet Take 1 tablet (325 mg total) by mouth every 6 (six) hours as needed for mild pain. 12/14/16   Gouru, Illene Silver, MD  albuterol (PROVENTIL HFA;VENTOLIN HFA) 108 (90 Base) MCG/ACT inhaler Inhale 2 puffs into the lungs every 6 (six) hours as needed for wheezing or shortness of breath.    [provider]  amLODipine (NORVASC) 5 MG tablet Take 1 tablet (5 mg total) by mouth daily. 07/01/16   Fritzi Mandes, MD  cyanocobalamin 500 MCG tablet Take 500 mcg by mouth 2 (two) times daily.    [provider]  docusate sodium (COLACE) 100 MG capsule Take 1  capsule (100 mg total) by mouth 2 (two) times daily as needed for mild constipation. 12/14/16   Nicholes Mango, MD  HYDROcodone-acetaminophen (NORCO/VICODIN) 5-325 MG tablet Take 1-2 tablets by mouth every 4 (four) hours as needed for moderate pain. 09/21/16   Epifanio Lesches, MD  levothyroxine (SYNTHROID, LEVOTHROID) 25 MCG tablet Take 1 tablet (25 mcg total) by mouth daily before breakfast. Patient not taking: Reported on 12/12/2016 07/22/16   Clabe Seal, PA-C  rosuvastatin (CRESTOR) 5 MG tablet Take 1 tablet (5 mg total) by mouth daily at 6  PM. Patient not taking: Reported on 12/12/2016 07/21/16   Clabe Seal, PA-C    Allergies Patient has no known allergies.  Family History  Problem Relation Age of Onset  . Hypertension Mother   . Congestive Heart Failure Father   . Hypertension Father     Social History Social History  Substance Use Topics  . Smoking status: Former Research scientist (life sciences)  . Smokeless tobacco: Former Systems developer  . Alcohol use No    Review of Systems  Constitutional: No fever/chills Eyes: No visual changes. ENT: No sore throat. Cardiovascular: as above Respiratory: Denies shortness of breath. Gastrointestinal: No abdominal pain.  No nausea, no vomiting.  No diarrhea.  No constipation. Genitourinary: Negative for dysuria. Musculoskeletal: as above Skin: Negative for rash. Neurological: Negative for headaches, focal weakness or numbness.   ____________________________________________   PHYSICAL EXAM:  VITAL SIGNS: ED Triage Vitals  Enc Vitals Group     BP --      Pulse --      Resp --      Temp --      Temp src --      SpO2 01/27/17 1208 97 %     Weight 01/27/17 1215 190 lb (86.2 kg)     Height 01/27/17 1215 6\' 5"  (1.956 m)     Head Circumference --      Peak Flow --      Pain Score --      Pain Loc --      Pain Edu? --      Excl. in [place]? --     Constitutional: Alert and oriented. Well appearing and in no acute distress. Eyes: Conjunctivae are normal.  Head: Atraumatic. Nose: No congestion/rhinnorhea. Mouth/Throat: Mucous membranes are moist.  Neck: No stridor.   Cardiovascular: Normal rate, regular rhythm. Grossly normal heart sounds.  Good peripheral circulation with equal and bilateral radial as well as dorsalis pedis pulses.  Also present is a pacer pocket over the left side of the chest without any overlying redness, induration or pus. Respiratory: Normal respiratory effort.  No retractions. Lungs CTAB. Gastrointestinal: Soft and nontender. No distention.  Musculoskeletal: No lower  extremity tenderness nor edema.  No joint effusions. Neurologic:  Normal speech and language. No gross focal neurologic deficits are appreciated. Skin:  Skin is warm, dry and intact. No rash noted. Psychiatric: Mood and affect are normal. Speech and behavior are normal.  ____________________________________________   LABS (all labs ordered are listed, but only abnormal results are displayed)  Labs Reviewed - No data to display ____________________________________________  EKG  ED ECG REPORT I, Schaevitz,  Youlanda Roys, the attending physician, personally viewed and interpreted this ECG.   Date: 01/27/2017  EKG Time: 1214  Rate: 101  Rhythm: atrial fibrillation, rate 101 with several ventricular paced spikes  Axis: Normal  Intervals:QTC prolongation at 512  ST&T Change: No ST segment elevation or depression.  No abnormal T wave inversion.  ____________________________________________  RADIOLOGY  More prominent interstitial markings are compared to prior study favored to represent interstitial edema given new small bilateral pleural effusions.  Also with a new 14 mm irregular density to the right upper lobe. ____________________________________________   PROCEDURES  Procedure(s) performed: None  Procedures  Critical Care performed: No  ____________________________________________   INITIAL IMPRESSION / ASSESSMENT AND PLAN / ED COURSE  Pertinent labs & imaging results that were available during my care of the patient were reviewed by me and considered in my medical decision making (see chart for details).  Differential diagnosis includes, but is not limited to, ACS, aortic dissection, pulmonary embolism, cardiac tamponade, pneumothorax, pneumonia, pericarditis/myocarditis, GI-related causes including esophagitis/gastritis, and musculoskeletal chest wall pain.    As part of my medical decision making, I reviewed the following data within the [place] chart reviewed and Notes from prior ED visits   ----------------------------------------- 3:22 PM on 01/27/2017 -----------------------------------------  Patient continues without complaints.  Family is now at the bedside.  Patient is not reporting any pain or shortness of breath at this time.  However, the patient does appear to have some mild edema on his chest x-ray.  We will give a dose of Lasix as well as his amlodipine.  The patient has not taken any of his medications at home this morning.  I also advised the patient's family as well as the patient to hold the next dose of Coumadin as the INR appears high.  Patient at this time pending urine as well as meds and second troponin.  Signed out to Dr. Burlene Arnt.     ____________________________________________   FINAL CLINICAL IMPRESSION(S) / ED DIAGNOSES  Chest pain.  Fatigue.  Elevated INR.    NEW MEDICATIONS STARTED DURING THIS VISIT:  New Prescriptions   No medications on file     Note:  This document was prepared using Dragon voice recognition software and may include unintentional dictation errors.     Orbie Pyo, MD 01/27/17 [phone]

## 2017-01-27 NOTE — ED Notes (Signed)
Transport to 1C-102.AS

## 2017-01-27 NOTE — H&P (Signed)
[place] [facility] [place] NAME: Kyle Vasquez    MR#:  [id number]  DATE OF BIRTH:  [birth year]  DATE OF ADMISSION:  01/27/2017  PRIMARY CARE PHYSICIAN: Tracie Harrier, MD   REQUESTING/REFERRING PHYSICIAN: Schuyler Amor, MD  CHIEF COMPLAINT:   Chief Complaint  Patient presents with  . Fatigue    Reports of chest pain over the last week    HISTORY OF PRESENT ILLNESS:  Kyle Vasquez  is a 83 y.o. male with a known history of atrial fibrillation on Coumadin who is presenting to the emergency department today with chest pain.  Son is at bedside who provided most of the information.  He reports patient has been having on and off chest pain along with wheezing and weakness since early this week.  He was not feeling well and saw his primary care physician about 2-3 weeks ago who prescribed antibiotic for possible UTI for total 7 days.  He finished his antibiotic this past Monday and since then he has not been feeling well.  He also reports some cough no phlegm coming out.   At baseline patient is wheelchair-bound.  He is bedridden.  He is followed by home health denies any falls.  He does talk.  Eats intermittently but normally does not have much appetite. son Does report some weight loss.  In the ED he was noted to have UTI and is being admitted for further evaluation management. PAST MEDICAL HISTORY:   Past Medical History:  Diagnosis Date  . Anemia   . Atrial fibrillation ([place])   . Bradycardia   . Chronic kidney disease   . COPD (chronic obstructive pulmonary disease) ([place])   . Coronary artery disease   . Depression   . Heart murmur   . HLD (hyperlipidemia)   . Hypertension   . Hypothyroidism   . Lumbar myelopathy ([place])   . MDS (myelodysplastic syndrome) ([place])   . OSA (obstructive sleep apnea)   . Stroke [facility])    left sided weakness  . Syncope     PAST SURGICAL HISTORY:   Past Surgical History:  Procedure Laterality Date    . ABDOMINAL AORTIC ANEURYSM REPAIR    . BACK SURGERY    . CARDIAC SURGERY     mitral valve surgery  . CARDIAC VALVE REPLACEMENT    . NECK SURGERY    . PACEMAKER INSERTION Left 07/20/2016   Procedure: INSERTION PACEMAKER;  Surgeon: Isaias Cowman, MD;  Location: [facility] ORS;  Service: Cardiovascular;  Laterality: Left;    SOCIAL HISTORY:   Social History  Substance Use Topics  . Smoking status: Former Research scientist (life sciences)  . Smokeless tobacco: Former Systems developer  . Alcohol use No    FAMILY HISTORY:   Family History  Problem Relation Age of Onset  . Hypertension Mother   . Congestive Heart Failure Father   . Hypertension Father     DRUG ALLERGIES:  No Known Allergies  REVIEW OF SYSTEMS:   Review of Systems  Constitutional: Negative for chills, fever and weight loss.  HENT: Negative for nosebleeds and sore throat.   Eyes: Negative for blurred vision.  Respiratory: Positive for cough, shortness of breath and wheezing.   Cardiovascular: Positive for chest pain. Negative for orthopnea, leg swelling and PND.  Gastrointestinal: Negative for abdominal pain, constipation, diarrhea, heartburn, nausea and vomiting.  Genitourinary: Negative for dysuria and urgency.  Musculoskeletal: Negative for back pain.  Skin: Negative for rash.  Neurological: Negative for dizziness, speech  change, focal weakness and headaches.  Endo/Heme/Allergies: Does not bruise/bleed easily.  Psychiatric/Behavioral: Negative for depression.   MEDICATIONS AT HOME:   Prior to Admission medications   Medication Sig Start Date End Date Taking? Authorizing Provider  amLODipine (NORVASC) 5 MG tablet Take 1 tablet (5 mg total) by mouth daily. Patient taking differently: Take 2.5 mg by mouth daily.  07/01/16  Yes Fritzi Mandes, MD  aspirin EC 81 MG tablet Take 81 mg by mouth daily.   Yes [provider]  cyanocobalamin 500 MCG tablet Take 500 mcg by mouth 2 (two) times daily.   Yes [provider]   Fluticasone-Salmeterol (ADVAIR) 250-50 MCG/DOSE AEPB Inhale 1 Vasquez into the lungs 2 (two) times daily.   Yes [provider]  Multiple Vitamins-Minerals (MULTIVITAMIN WITH MINERALS) tablet Take 1 tablet by mouth daily.   Yes [provider]  warfarin (COUMADIN) 3 MG tablet Take 3 mg by mouth daily. Take as directed by anticoagulation clinic   Yes [provider]  acetaminophen (TYLENOL) 325 MG tablet Take 1 tablet (325 mg total) by mouth every 6 (six) hours as needed for mild pain. 12/14/16   Gouru, Illene Silver, MD  albuterol (PROVENTIL HFA;VENTOLIN HFA) 108 (90 Base) MCG/ACT inhaler Inhale 2 puffs into the lungs every 6 (six) hours as needed for wheezing or shortness of breath.    [provider]  docusate sodium (COLACE) 100 MG capsule Take 1 capsule (100 mg total) by mouth 2 (two) times daily as needed for mild constipation. 12/14/16   Nicholes Mango, MD  HYDROcodone-acetaminophen (NORCO/VICODIN) 5-325 MG tablet Take 1-2 tablets by mouth every 4 (four) hours as needed for moderate pain. Patient not taking: Reported on 01/27/2017 09/21/16   Epifanio Lesches, MD  levothyroxine (SYNTHROID, LEVOTHROID) 25 MCG tablet Take 1 tablet (25 mcg total) by mouth daily before breakfast. Patient not taking: Reported on 12/12/2016 07/22/16   Clabe Seal, PA-C  rosuvastatin (CRESTOR) 5 MG tablet Take 1 tablet (5 mg total) by mouth daily at 6 PM. Patient not taking: Reported on 12/12/2016 07/21/16   Clabe Seal, PA-C      VITAL SIGNS:  Blood pressure (!) 168/121, pulse 69, temperature (!) 97.5 F (36.4 C), temperature source Oral, resp. rate 12, height 6\' 5"  (1.956 m), weight 86.2 kg (190 lb), SpO2 99 %.  PHYSICAL EXAMINATION:  Physical Exam  GENERAL:  83 y.o.-year-old patient lying in the bed with no acute distress.  EYES: Pupils equal, round, reactive to light and accommodation. No scleral icterus. Extraocular muscles intact.  HEENT: Head atraumatic, normocephalic. Oropharynx and  nasopharynx clear.  NECK:  Supple, no jugular venous distention. No thyroid enlargement, no tenderness.  LUNGS: Normal breath sounds bilaterally, no wheezing, rales,rhonchi or crepitation. No use of accessory muscles of respiration.  CARDIOVASCULAR: S1, S2 normal. No murmurs, rubs, or gallops.  ABDOMEN: Soft, nontender, nondistended. Bowel sounds present. No organomegaly or mass.  EXTREMITIES: No pedal edema, cyanosis, or clubbing.  NEUROLOGIC: Cranial nerves II through XII are intact. Muscle strength 5/5 in all extremities. Sensation intact. Gait not checked.  PSYCHIATRIC: The patient is alert.  He is sleepy and does not want to converse.  SKIN: No obvious rash, lesion, or ulcer.  LABORATORY PANEL:   CBC  Recent Labs Lab 01/27/17 1222  WBC 3.3*  HGB 8.3*  HCT 25.4*  PLT 165   ------------------------------------------------------------------------------------------------------------------  Chemistries   Recent Labs Lab 01/27/17 1222  NA 141  K 4.9  CL 117*  CO2 19*  GLUCOSE 80  BUN 60*  CREATININE 3.74*  CALCIUM 8.9  AST 23  ALT 15*  ALKPHOS 67  BILITOT 0.7   ------------------------------------------------------------------------------------------------------------------  Cardiac Enzymes  Recent Labs Lab 01/27/17 1515  TROPONINI 0.04*   ------------------------------------------------------------------------------------------------------------------  RADIOLOGY:  Dg Chest 2 View  Result Date: 01/27/2017 CLINICAL DATA:  Chest pain and shortness of breath for the past week. EXAM: CHEST  2 VIEW COMPARISON:  Chest x-ray dated September 19, 2016. FINDINGS: Unchanged left chest wall single lead pacer device. Stable cardiomegaly. Normal pulmonary vascularity. Increased interstitial markings, more prominent when compared to prior study. 14 mm irregular density in the peripheral right upper lobe, new when compared to prior study. Small bilateral pleural effusions. No  pneumothorax. No acute osseous abnormality. IMPRESSION: 1. More prominent interstitial markings when compared to prior study, favored to represent interstitial edema given new small bilateral pleural effusions. 2. New 14 mm irregular density in the peripheral right upper lobe. Recommend chest CT for further evaluation. Electronically Signed   By: Titus Dubin M.D.   On: 01/27/2017 13:00   IMPRESSION AND PLAN:  83 year old male with a known history of atrial fibrillation on Coumadin who is presenting to the emergency department today with chest pain, UTI.  *Chest pain -Likely noncardiac -We will get 3 sets of troponin  *UTI -Based on UA -We will obtain urine culture and start IV Rocephin  *Weakness: -Likely multifactorial with UTI -At baseline he does not ambulate and is bedridden.  Will hold off on physical therapy  *CKD stage IV -Baseline, monitor  *Anemia of chronic kidney disease -Monitor hemoglobin   All the records are reviewed and case discussed with ED provider. Management plans discussed with the patient, family (son at bedside who is POA) and they are in agreement.  CODE STATUS: DNR  TOTAL TIME TAKING CARE OF THIS PATIENT: 45 minutes.    Max Sane M.D on 01/27/2017 at 8:18 PM  Between 7am to 6pm - Pager - [phone]  After 6pm go to [email or web address] - Proofreader  [facility]  Office  [phone]  CC: Primary care physician; Tracie Harrier, MD   Note: This dictation was prepared with Dragon dictation along with smaller phrase technology. Any transcriptional errors that result from this process are unintentional.

## 2017-01-27 NOTE — ED Triage Notes (Signed)
Patient denies pain, EMS states patient has alight dementia, care taker reported to EMS patient c/o chest pain the past week. Also reported patient has become more lethargic lately .

## 2017-01-27 NOTE — ED Provider Notes (Signed)
-----------------------------------------   6:24 PM on 01/27/2017 -----------------------------------------  Pt here with several issues. The first is that he has progressive weakness and has a urinary tract infection.  This is likely contributory.  The patient's family state that he was on an unknown antibiotic until a few days ago for a UTI but did not seem to get better.  Cultures from this facility show that the patient is sensitive in his urinary tract infections to Rocephin so we will start with that.  In addition, patient's blood pressure is elevated.  We did try giving his home medications but he is persistently hypertensive.  He is asymptomatic with this but he has been having chest pain on and off.  I will give nadolol to see if we can gradually bring down his pressure.  Given all these problems I think patient would benefit from an admission in the hospitalist agrees.   Schuyler Amor, MD 01/27/17 [phone]

## 2017-01-28 DIAGNOSIS — N184 Chronic kidney disease, stage 4 (severe): Secondary | ICD-10-CM | POA: Diagnosis not present

## 2017-01-28 DIAGNOSIS — R531 Weakness: Secondary | ICD-10-CM | POA: Diagnosis not present

## 2017-01-28 DIAGNOSIS — N390 Urinary tract infection, site not specified: Secondary | ICD-10-CM | POA: Diagnosis not present

## 2017-01-28 DIAGNOSIS — R079 Chest pain, unspecified: Secondary | ICD-10-CM | POA: Diagnosis not present

## 2017-01-28 LAB — BASIC METABOLIC PANEL
ANION GAP: 8 (ref 5–15)
BUN: 59 mg/dL — ABNORMAL HIGH (ref 6–20)
CALCIUM: 8.7 mg/dL — AB (ref 8.9–10.3)
CO2: 19 mmol/L — AB (ref 22–32)
Chloride: 115 mmol/L — ABNORMAL HIGH (ref 101–111)
Creatinine, Ser: 3.98 mg/dL — ABNORMAL HIGH (ref 0.61–1.24)
GFR calc Af Amer: 15 mL/min — ABNORMAL LOW (ref >60)
GFR, EST NON AFRICAN AMERICAN: 13 mL/min — AB (ref >60)
Glucose, Bld: 63 mg/dL — ABNORMAL LOW (ref 65–99)
POTASSIUM: 4.9 mmol/L (ref 3.5–5.1)
SODIUM: 142 mmol/L (ref 135–145)

## 2017-01-28 LAB — CBC
HEMATOCRIT: 23.4 % — AB (ref 40.0–52.0)
HEMOGLOBIN: 7.7 g/dL — AB (ref 13.0–18.0)
MCH: 30.2 pg (ref 26.0–34.0)
MCHC: 32.7 g/dL (ref 32.0–36.0)
MCV: 92.3 fL (ref 80.0–100.0)
Platelets: 162 10*3/uL (ref 150–440)
RBC: 2.54 MIL/uL — AB (ref 4.40–5.90)
RDW: 16.5 % — ABNORMAL HIGH (ref 11.5–14.5)
WBC: 3.4 10*3/uL — ABNORMAL LOW (ref 3.8–10.6)

## 2017-01-28 LAB — TROPONIN I
TROPONIN I: 0.04 ng/mL — AB (ref <0.03)
TROPONIN I: 0.04 ng/mL — AB (ref <0.03)

## 2017-01-28 LAB — GLUCOSE, CAPILLARY
GLUCOSE-CAPILLARY: 55 mg/dL — AB (ref 65–99)
GLUCOSE-CAPILLARY: 56 mg/dL — AB (ref 65–99)
Glucose-Capillary: 77 mg/dL (ref 65–99)

## 2017-01-28 MED ORDER — TAMSULOSIN HCL 0.4 MG PO CAPS: 0.4000 mg | ORAL_CAPSULE | Freq: Every day | ORAL | 0 refills | Status: AC

## 2017-01-28 MED ORDER — DEXTROSE 5 % IV SOLN
1.0000 g | INTRAVENOUS | Status: DC
  Filled 2017-01-28: qty 10

## 2017-01-28 MED ORDER — CEFUROXIME AXETIL 250 MG PO TABS: 250.0000 mg | ORAL_TABLET | Freq: Two times a day (BID) | ORAL | 0 refills | Status: AC

## 2017-01-28 NOTE — Discharge Summary (Signed)
[place] at [place] NAME: Kyle Vasquez    MR#:  [id number]  DATE OF BIRTH:  1931/09/14  DATE OF ADMISSION:  01/27/2017 ADMITTING PHYSICIAN: Max Sane, MD  DATE OF DISCHARGE: 01/28/2017   PRIMARY CARE PHYSICIAN: Tracie Harrier, MD    ADMISSION DIAGNOSIS:  Elevated INR [R79.1] Urinary tract infection without hematuria, site unspecified [N39.0] Fatigue, unspecified type [R53.83] Chest pain, unspecified type [R07.9]  DISCHARGE DIAGNOSIS:  Active Problems:   UTI (urinary tract infection)   SECONDARY DIAGNOSIS:   Past Medical History:  Diagnosis Date  . Anemia   . Atrial fibrillation ([place])   . Bradycardia   . Chronic kidney disease   . COPD (chronic obstructive pulmonary disease) ([place])   . Coronary artery disease   . Depression   . Heart murmur   . HLD (hyperlipidemia)   . Hypertension   . Hypothyroidism   . Lumbar myelopathy ([place])   . MDS (myelodysplastic syndrome) ([place])   . OSA (obstructive sleep apnea)   . Stroke [facility])    left sided weakness  . Syncope     HOSPITAL COURSE:   *Chest pain -Likely noncardiac -get 3 sets of troponin- negative.  *UTI -Based on UA - [facility] culture and start IV Rocephin - Much better next day as per family. - Reviewed Ur cx result from office 2 weeks ago- Proteus and kelbsiella- sensitive to cefuroxime. - Advised to follow with PMD in 2-3 days to follow on cx results here. - also suggest to have further work ups with PMD for the reason for recurrent UTI, starting on flomax due to symptoms of frequency and obstruction.  *Weakness: -Likely multifactorial with UTI -At baseline he does not ambulate and is bedridden.  Will hold off on physical therapy - bed bound and wheel chair at home, family takes care of him.  *CKD stage IV -Baseline, monitor, stable.  *Anemia of chronic kidney disease -Monitor hemoglobin  * Lung nodule    Spoke to his son, daughter and wife  in room- he was a smoker in past.    Encouraged them to have discussion with PMD about further work ups.    Also suggested, to think about " If they are willing to have treatment, if it is found to be a cancer, if not then should decide not to go for extensive invetigations." They will discuss with PMD.  DISCHARGE CONDITIONS:   Stable.  CONSULTS OBTAINED:    DRUG ALLERGIES:  No Known Allergies  DISCHARGE MEDICATIONS:   Current Discharge Medication List    START taking these medications   Details  cefUROXime (CEFTIN) 250 MG tablet Take 1 tablet (250 mg total) by mouth 2 (two) times daily. Qty: 10 tablet, Refills: 0    tamsulosin (FLOMAX) 0.4 MG CAPS capsule Take 1 capsule (0.4 mg total) by mouth daily. Qty: 30 capsule, Refills: 0      CONTINUE these medications which have NOT CHANGED   Details  amLODipine (NORVASC) 5 MG tablet Take 1 tablet (5 mg total) by mouth daily. Qty: 30 tablet, Refills: 0    aspirin EC 81 MG tablet Take 81 mg by mouth daily.    cyanocobalamin 500 MCG tablet Take 500 mcg by mouth 2 (two) times daily.    Fluticasone-Salmeterol (ADVAIR) 250-50 MCG/DOSE AEPB Inhale 1 puff into the lungs 2 (two) times daily.    Multiple Vitamins-Minerals (MULTIVITAMIN WITH MINERALS) tablet Take 1 tablet by mouth daily.    warfarin (COUMADIN) 3  MG tablet Take 3 mg by mouth daily. Take as directed by anticoagulation clinic    acetaminophen (TYLENOL) 325 MG tablet Take 1 tablet (325 mg total) by mouth every 6 (six) hours as needed for mild pain.    albuterol (PROVENTIL HFA;VENTOLIN HFA) 108 (90 Base) MCG/ACT inhaler Inhale 2 puffs into the lungs every 6 (six) hours as needed for wheezing or shortness of breath.    docusate sodium (COLACE) 100 MG capsule Take 1 capsule (100 mg total) by mouth 2 (two) times daily as needed for mild constipation. Qty: 10 capsule, Refills: 0    HYDROcodone-acetaminophen (NORCO/VICODIN) 5-325 MG tablet Take 1-2 tablets by mouth every 4  (four) hours as needed for moderate pain. Qty: 30 tablet, Refills: 0    levothyroxine (SYNTHROID, LEVOTHROID) 25 MCG tablet Take 1 tablet (25 mcg total) by mouth daily before breakfast. Qty: 30 tablet, Refills: 0    rosuvastatin (CRESTOR) 5 MG tablet Take 1 tablet (5 mg total) by mouth daily at 6 PM. Qty: 30 tablet         DISCHARGE INSTRUCTIONS:    Follow with PMD in 2-3 days.  If you experience worsening of your admission symptoms, develop shortness of breath, life threatening emergency, suicidal or homicidal thoughts you must seek medical attention immediately by calling 911 or calling your MD immediately  if symptoms less severe.  You Must read complete instructions/literature along with all the possible adverse reactions/side effects for all the Medicines you take and that have been prescribed to you. Take any new Medicines after you have completely understood and accept all the possible adverse reactions/side effects.   Please note  You were cared for by a hospitalist during your hospital stay. If you have any questions about your discharge medications or the care you received while you were in the hospital after you are discharged, you can call the unit and asked to speak with the hospitalist on call if the hospitalist that took care of you is not available. Once you are discharged, your primary care physician will handle any further medical issues. Please note that NO REFILLS for any discharge medications will be authorized once you are discharged, as it is imperative that you return to your primary care physician (or establish a relationship with a primary care physician if you do not have one) for your aftercare needs so that they can reassess your need for medications and monitor your lab values.    Today   CHIEF COMPLAINT:   Chief Complaint  Patient presents with  . Fatigue    Reports of chest pain over the last week    HISTORY OF PRESENT ILLNESS:  Kyle Vasquez   is a 83 y.o. male with a known history of atrial fibrillation on Coumadin who is presenting to the emergency department today with chest pain.  Son is at bedside who provided most of the information.  He reports patient has been having on and off chest pain along with wheezing and weakness since early this week.  He was not feeling well and saw his primary care physician about 2-3 weeks ago who prescribed antibiotic for possible UTI for total 7 days.  He finished his antibiotic this past Monday and since then he has not been feeling well.  He also reports some cough no phlegm coming out.   At baseline patient is wheelchair-bound.  He is bedridden.  He is followed by home health denies any falls.  He does talk.  Eats intermittently but normally  does not have much appetite. son Does report some weight loss.  In the ED he was noted to have UTI and is being admitted for further evaluation management.   VITAL SIGNS:  Blood pressure (!) 145/71, pulse 63, temperature 97.7 F (36.5 C), temperature source Oral, resp. rate 16, height 6\' 5"  (1.956 m), weight 72.5 kg (159 lb 12.8 oz), SpO2 95 %.  I/O:    Intake/Output Summary (Last 24 hours) at 01/28/17 1503 Last data filed at 01/28/17 0800  Gross per 24 hour  Intake           798.75 ml  Output                0 ml  Net           798.75 ml    PHYSICAL EXAMINATION:   GENERAL:  83 y.o.-year-old patient lying in the bed with no acute distress.  EYES: Pupils equal, round, reactive to light and accommodation. No scleral icterus. Extraocular muscles intact.  HEENT: Head atraumatic, normocephalic. Oropharynx and nasopharynx clear.  NECK:  Supple, no jugular venous distention. No thyroid enlargement, no tenderness.  LUNGS: Normal breath sounds bilaterally, no wheezing, rales,rhonchi or crepitation. No use of accessory muscles of respiration.  CARDIOVASCULAR: S1, S2 normal. No murmurs, rubs, or gallops.  ABDOMEN: Soft, nontender, nondistended. Bowel sounds  present. No organomegaly or mass.  EXTREMITIES: No pedal edema, cyanosis, or clubbing.  NEUROLOGIC: Cranial nerves II through XII are intact. Muscle strength 5/5 in all extremities. Sensation intact. Gait not checked.  PSYCHIATRIC: The patient is alert.  He is sleepy and does not want to converse.  SKIN: No obvious rash, lesion, or ulcer.   DATA REVIEW:   CBC  Recent Labs Lab 01/28/17 0401  WBC 3.4*  HGB 7.7*  HCT 23.4*  PLT 162    Chemistries   Recent Labs Lab 01/27/17 1222 01/28/17 0401  NA 141 142  K 4.9 4.9  CL 117* 115*  CO2 19* 19*  GLUCOSE 80 63*  BUN 60* 59*  CREATININE 3.74* 3.98*  CALCIUM 8.9 8.7*  AST 23  --   ALT 15*  --   ALKPHOS 67  --   BILITOT 0.7  --     Cardiac Enzymes  Recent Labs Lab 01/28/17 0401  TROPONINI 0.04*    Microbiology Results  Results for orders placed or performed during the hospital encounter of 07/18/16  Surgical pcr screen     Status: None   Collection Time: 07/18/16 12:55 PM  Result Value Ref Range Status   MRSA, PCR NEGATIVE NEGATIVE Final   Staphylococcus aureus NEGATIVE NEGATIVE Final    Comment:        The Xpert SA Assay (FDA approved for NASAL specimens in patients over 74 years of age), is one component of a comprehensive surveillance program.  Test performance has been validated by [facility] for patients greater than or equal to 3 year old. It is not intended to diagnose infection nor to guide or monitor treatment.     RADIOLOGY:  Dg Chest 2 View  Result Date: 01/27/2017 CLINICAL DATA:  Chest pain and shortness of breath for the past week. EXAM: CHEST  2 VIEW COMPARISON:  Chest x-ray dated September 19, 2016. FINDINGS: Unchanged left chest wall single lead pacer device. Stable cardiomegaly. Normal pulmonary vascularity. Increased interstitial markings, more prominent when compared to prior study. 14 mm irregular density in the peripheral right upper lobe, new when compared to prior study. Small bilateral  pleural effusions. No pneumothorax. No acute osseous abnormality. IMPRESSION: 1. More prominent interstitial markings when compared to prior study, favored to represent interstitial edema given new small bilateral pleural effusions. 2. New 14 mm irregular density in the peripheral right upper lobe. Recommend chest CT for further evaluation. Electronically Signed   By: Titus Dubin M.D.   On: 01/27/2017 13:00    EKG:   Orders placed or performed during the hospital encounter of 01/27/17  . EKG 12-Lead  . EKG 12-Lead      Management plans discussed with the patient, family and they are in agreement.  CODE STATUS:     Code Status Orders        Start     Ordered   01/27/17 2018  Do not attempt resuscitation (DNR)  Continuous    Question Answer Comment  In the event of cardiac or respiratory ARREST Do not call a "code blue"   In the event of cardiac or respiratory ARREST Do not perform Intubation, CPR, defibrillation or ACLS   In the event of cardiac or respiratory ARREST Use medication by any route, position, wound care, and other measures to relive pain and suffering. May use oxygen, suction and manual treatment of airway obstruction as needed for comfort.      01/27/17 2017    Code Status History    Date Active Date Inactive Code Status Order ID Comments User Context   12/12/2016  3:56 PM 12/14/2016  8:28 PM DNR [id number]  Vaughan Basta, MD Inpatient   09/19/2016  7:02 PM 09/21/2016  7:55 PM Full Code [id number]  Bettey Costa, MD Inpatient   07/20/2016  4:50 PM 07/21/2016  4:51 PM Full Code [id number]  Isaias Cowman, MD Inpatient   06/29/2016  7:37 PM 07/01/2016  9:03 PM Full Code [id number]  Gladstone Lighter, MD Inpatient   09/07/2015  9:31 PM 09/08/2015  7:58 PM Full Code [id number]  Quintella Baton, MD Inpatient      TOTAL TIME TAKING CARE OF THIS PATIENT: 35 minutes.    Vaughan Basta M.D on 01/28/2017 at 3:03 PM  Between 7am to 6pm - Pager -  [phone]  After 6pm go to [email or web address] - password [email or web address] [place] [facility]  Office  [phone]  CC: Primary care physician; Tracie Harrier, MD   Note: This dictation was prepared with Dragon dictation along with smaller phrase technology. Any transcriptional errors that result from this process are unintentional.

## 2017-01-28 NOTE — Progress Notes (Addendum)
MD order to discharge patient home. Discharge instructions, prescriptions and instructions for follow up appointment was reviewed with patient, his wife and his son. Questions were encouraged and answered. Son is here to transport patient home.

## 2017-01-30 DIAGNOSIS — I10 Essential (primary) hypertension: Secondary | ICD-10-CM | POA: Diagnosis not present

## 2017-01-30 LAB — URINE CULTURE: Culture: >=100000 — AB

## 2017-01-31 DIAGNOSIS — I10 Essential (primary) hypertension: Secondary | ICD-10-CM | POA: Diagnosis not present

## 2017-02-01 ENCOUNTER — Emergency Department: Payer: Medicare HMO

## 2017-02-01 ENCOUNTER — Encounter: Payer: Self-pay | Admitting: Emergency Medicine

## 2017-02-01 ENCOUNTER — Emergency Department
Admission: EM | Admit: 2017-02-01 | Discharge: 2017-02-01 | Disposition: A | Discharge status: Home / Self Care | Payer: Medicare HMO | Attending: Emergency Medicine | Admitting: Emergency Medicine

## 2017-02-01 DIAGNOSIS — E039 Hypothyroidism, unspecified: Secondary | ICD-10-CM | POA: Insufficient documentation

## 2017-02-01 DIAGNOSIS — I10 Essential (primary) hypertension: Secondary | ICD-10-CM | POA: Diagnosis not present

## 2017-02-01 DIAGNOSIS — D649 Anemia, unspecified: Secondary | ICD-10-CM | POA: Diagnosis not present

## 2017-02-01 DIAGNOSIS — Z79899 Other long term (current) drug therapy: Secondary | ICD-10-CM | POA: Insufficient documentation

## 2017-02-01 DIAGNOSIS — K920 Hematemesis: Secondary | ICD-10-CM | POA: Diagnosis not present

## 2017-02-01 DIAGNOSIS — I129 Hypertensive chronic kidney disease with stage 1 through stage 4 chronic kidney disease, or unspecified chronic kidney disease: Secondary | ICD-10-CM | POA: Diagnosis not present

## 2017-02-01 DIAGNOSIS — I2510 Atherosclerotic heart disease of native coronary artery without angina pectoris: Secondary | ICD-10-CM | POA: Insufficient documentation

## 2017-02-01 DIAGNOSIS — I50810 Right heart failure, unspecified: Secondary | ICD-10-CM | POA: Diagnosis not present

## 2017-02-01 DIAGNOSIS — N189 Chronic kidney disease, unspecified: Secondary | ICD-10-CM | POA: Diagnosis not present

## 2017-02-01 DIAGNOSIS — J189 Pneumonia, unspecified organism: Secondary | ICD-10-CM | POA: Insufficient documentation

## 2017-02-01 DIAGNOSIS — R042 Hemoptysis: Secondary | ICD-10-CM

## 2017-02-01 DIAGNOSIS — R791 Abnormal coagulation profile: Secondary | ICD-10-CM | POA: Insufficient documentation

## 2017-02-01 DIAGNOSIS — R918 Other nonspecific abnormal finding of lung field: Secondary | ICD-10-CM | POA: Diagnosis not present

## 2017-02-01 DIAGNOSIS — Z7901 Long term (current) use of anticoagulants: Secondary | ICD-10-CM | POA: Diagnosis not present

## 2017-02-01 DIAGNOSIS — I4891 Unspecified atrial fibrillation: Secondary | ICD-10-CM | POA: Diagnosis not present

## 2017-02-01 DIAGNOSIS — I517 Cardiomegaly: Secondary | ICD-10-CM | POA: Diagnosis not present

## 2017-02-01 DIAGNOSIS — Z79891 Long term (current) use of opiate analgesic: Secondary | ICD-10-CM | POA: Insufficient documentation

## 2017-02-01 DIAGNOSIS — J449 Chronic obstructive pulmonary disease, unspecified: Secondary | ICD-10-CM | POA: Diagnosis not present

## 2017-02-01 DIAGNOSIS — I482 Chronic atrial fibrillation: Secondary | ICD-10-CM | POA: Diagnosis not present

## 2017-02-01 DIAGNOSIS — I130 Hypertensive heart and chronic kidney disease with heart failure and stage 1 through stage 4 chronic kidney disease, or unspecified chronic kidney disease: Secondary | ICD-10-CM | POA: Diagnosis not present

## 2017-02-01 LAB — CBC WITH DIFFERENTIAL/PLATELET
BASOS PCT: 1 %
Basophils Absolute: 0 10*3/uL (ref 0–0.1)
EOS PCT: 6 %
Eosinophils Absolute: 0.2 10*3/uL (ref 0–0.7)
HEMATOCRIT: 22.4 % — AB (ref 40.0–52.0)
Hemoglobin: 7.2 g/dL — ABNORMAL LOW (ref 13.0–18.0)
LYMPHS PCT: 19 %
Lymphs Abs: 0.6 10*3/uL — ABNORMAL LOW (ref 1.0–3.6)
MCH: 29.8 pg (ref 26.0–34.0)
MCHC: 32.2 g/dL (ref 32.0–36.0)
MCV: 92.4 fL (ref 80.0–100.0)
MONO ABS: 0.3 10*3/uL (ref 0.2–1.0)
MONOS PCT: 10 %
NEUTROS ABS: 2 10*3/uL (ref 1.4–6.5)
Neutrophils Relative %: 64 %
PLATELETS: 176 10*3/uL (ref 150–440)
RBC: 2.42 MIL/uL — ABNORMAL LOW (ref 4.40–5.90)
RDW: 16.8 % — AB (ref 11.5–14.5)
WBC: 3.2 10*3/uL — ABNORMAL LOW (ref 3.8–10.6)

## 2017-02-01 LAB — BASIC METABOLIC PANEL
ANION GAP: 5 (ref 5–15)
BUN: 55 mg/dL — AB (ref 6–20)
CALCIUM: 8.5 mg/dL — AB (ref 8.9–10.3)
CO2: 20 mmol/L — AB (ref 22–32)
Chloride: 116 mmol/L — ABNORMAL HIGH (ref 101–111)
Creatinine, Ser: 3.88 mg/dL — ABNORMAL HIGH (ref 0.61–1.24)
GFR calc Af Amer: 15 mL/min — ABNORMAL LOW (ref >60)
GFR, EST NON AFRICAN AMERICAN: 13 mL/min — AB (ref >60)
GLUCOSE: 92 mg/dL (ref 65–99)
Potassium: 4.9 mmol/L (ref 3.5–5.1)
Sodium: 141 mmol/L (ref 135–145)

## 2017-02-01 LAB — HEMOGLOBIN AND HEMATOCRIT, BLOOD
HCT: 27.4 % — ABNORMAL LOW (ref 40.0–52.0)
Hemoglobin: 8.9 g/dL — ABNORMAL LOW (ref 13.0–18.0)

## 2017-02-01 LAB — PROTIME-INR
INR: 4.09
Prothrombin Time: 39.4 seconds — ABNORMAL HIGH (ref 11.4–15.2)

## 2017-02-01 LAB — TROPONIN I: Troponin I: 0.03 ng/mL (ref <0.03)

## 2017-02-01 LAB — PREPARE RBC (CROSSMATCH)

## 2017-02-01 MED ORDER — AZITHROMYCIN 500 MG PO TABS
500.0000 mg | ORAL_TABLET | Freq: Once | ORAL | Status: AC
  Administered 2017-02-01: 500 mg via ORAL
  Filled 2017-02-01: qty 1

## 2017-02-01 MED ORDER — AZITHROMYCIN 250 MG PO TABS: ORAL_TABLET | ORAL | 0 refills | Status: AC

## 2017-02-01 MED ORDER — SODIUM CHLORIDE 0.9 % IV SOLN
10.0000 mL/h | Freq: Once | INTRAVENOUS | Status: AC
  Administered 2017-02-01: 10 mL/h via INTRAVENOUS

## 2017-02-01 NOTE — ED Triage Notes (Signed)
Pt to ED by EMS from home. Family called stating that pt is coughing up blood. Per EMS appears to be dark and coffee ground consistency. Pt has no c/o of pain.

## 2017-02-01 NOTE — ED Provider Notes (Signed)
[facility] Emergency Department Provider Note ____________________________________________   I have reviewed the triage vital signs and the triage nursing note.  HISTORY  Chief Complaint Hemoptysis   Historian Level 5 Caveat History Limited by limited historian  HPI Keziah Drotar is a 83 y.o. male from nursing home, with a history of chronic A. fib on Coumadin, history of spontaneous upper extremity hematoma within the last month due to supratherapeutic INR with low hemoglobin followed by transfusion, presenting today for what sound like small amount of blood when he was coughing this morning.  States this is not happened before.  States that he will often have a cough right in the morning.  He does not report fevers or any worsening shortness of breath or any ongoing shortness of breath.  There is no chest pain.  But he did not know if he was on a blood thinner or not.  I was able to review prior hospitalizations within the past month and his medication list.   Past Medical History:  Diagnosis Date  . Anemia   . Atrial fibrillation ([place])   . Bradycardia   . Chronic kidney disease   . COPD (chronic obstructive pulmonary disease) ([place])   . Coronary artery disease   . Depression   . Heart murmur   . HLD (hyperlipidemia)   . Hypertension   . Hypothyroidism   . Lumbar myelopathy ([place])   . MDS (myelodysplastic syndrome) ([place])   . OSA (obstructive sleep apnea)   . Stroke [facility])    left sided weakness  . Syncope     Patient Active Problem List   Diagnosis Date Noted  . UTI (urinary tract infection) 01/27/2017  . Hematoma 12/12/2016  . Carotid atherosclerosis, bilateral 11/25/2016  . PVD (peripheral vascular disease) ([place]) 11/25/2016  . AAA (abdominal aortic aneurysm) without rupture ([place]) 11/25/2016  . Elevated troponin 09/20/2016  . Sick sinus syndrome ([place]) 07/20/2016  . Syncope 06/29/2016  . ARF (acute renal failure) ([place]) 06/29/2016  .  Bradycardia 09/08/2015  . Symptomatic bradycardia 09/07/2015  . Syncope and collapse 09/07/2015  . Atrial fibrillation ([place]) 09/07/2015  . HTN (hypertension) 09/07/2015  . Physical deconditioning 09/07/2015  . H/O: CVA (cerebrovascular accident) 09/07/2015    Past Surgical History:  Procedure Laterality Date  . ABDOMINAL AORTIC ANEURYSM REPAIR    . BACK SURGERY    . CARDIAC SURGERY     mitral valve surgery  . CARDIAC VALVE REPLACEMENT    . NECK SURGERY      Prior to Admission medications   Medication Sig Start Date End Date Taking? Authorizing Provider  amLODipine (NORVASC) 5 MG tablet Take 1 tablet (5 mg total) by mouth daily. 07/01/16  Yes Fritzi Mandes, MD  aspirin EC 81 MG tablet Take 81 mg by mouth daily.   Yes [provider]  cefUROXime (CEFTIN) 250 MG tablet Take 1 tablet (250 mg total) by mouth 2 (two) times daily. 01/28/17 02/02/17 Yes Vaughan Basta, MD  cyanocobalamin 500 MCG tablet Take 500 mcg by mouth 2 (two) times daily.   Yes [provider]  docusate sodium (COLACE) 100 MG capsule Take 1 capsule (100 mg total) by mouth 2 (two) times daily as needed for mild constipation. 12/14/16  Yes Gouru, Illene Silver, MD  Fluticasone-Salmeterol (ADVAIR) 250-50 MCG/DOSE AEPB Inhale 1 puff into the lungs 2 (two) times daily.   Yes [provider]  levothyroxine (SYNTHROID, LEVOTHROID) 25 MCG tablet Take 1 tablet (25 mcg total) by mouth daily before breakfast.  07/22/16  Yes Clabe Seal, PA-C  Multiple Vitamins-Minerals (MULTIVITAMIN WITH MINERALS) tablet Take 1 tablet by mouth daily.   Yes [provider]  rosuvastatin (CRESTOR) 5 MG tablet Take 1 tablet (5 mg total) by mouth daily at 6 PM. 07/21/16  Yes Clabe Seal, PA-C  tamsulosin (FLOMAX) 0.4 MG CAPS capsule Take 1 capsule (0.4 mg total) by mouth daily. 01/28/17  Yes Vaughan Basta, MD  warfarin (COUMADIN) 3 MG tablet Take 3 mg by mouth daily. Take as directed by anticoagulation clinic   Yes  [provider]  acetaminophen (TYLENOL) 325 MG tablet Take 1 tablet (325 mg total) by mouth every 6 (six) hours as needed for mild pain. 12/14/16   Gouru, Illene Silver, MD  albuterol (PROVENTIL HFA;VENTOLIN HFA) 108 (90 Base) MCG/ACT inhaler Inhale 2 puffs into the lungs every 6 (six) hours as needed for wheezing or shortness of breath.    [provider]  azithromycin (ZITHROMAX) 250 MG tablet 250mg  once daily for 4 more days 02/01/17   Lisa Roca, MD  HYDROcodone-acetaminophen (NORCO/VICODIN) 5-325 MG tablet Take 1-2 tablets by mouth every 4 (four) hours as needed for moderate pain. Patient not taking: Reported on 01/27/2017 09/21/16   Epifanio Lesches, MD    No Known Allergies  Family History  Problem Relation Age of Onset  . Hypertension Mother   . Congestive Heart Failure Father   . Hypertension Father     Social History Social History   Tobacco Use  . Smoking status: Former Research scientist (life sciences)  . Smokeless tobacco: Never Used  Substance Use Topics  . Alcohol use: No  . Drug use: No    Review of Systems  Constitutional: Negative for fever. Eyes: Negative for visual changes. ENT: Negative for sore throat. Cardiovascular: Negative for chest pain. Respiratory: Negative for shortness of breath.  Small hemoptysis.  Patient denies any vomiting. Gastrointestinal: Negative for abdominal pain, vomiting and diarrhea. Genitourinary: Negative for dysuria. Musculoskeletal: Negative for back pain. Skin: Negative for rash. Neurological: Negative for headache.  ____________________________________________   PHYSICAL EXAM:  VITAL SIGNS: ED Triage Vitals  Enc Vitals Group     BP 02/01/17 1037 (!) 160/93     Pulse Rate 02/01/17 1037 76     Resp 02/01/17 1037 16     Temp 02/01/17 1037 97.7 F (36.5 C)     Temp Source 02/01/17 1037 Oral     SpO2 02/01/17 1034 97 %     Weight 02/01/17 1038 159 lb (72.1 kg)     Height 02/01/17 1038 6\' 5"  (1.956 m)     Head Circumference --       Peak Flow --      Pain Score --      Pain Loc --      Pain Edu? --      Excl. in [place]? --      Constitutional: Alert and cooperative, but poor historian. Well appearing and in no distress. HEENT   Head: Normocephalic and atraumatic.      Eyes: Conjunctivae are normal. Pupils equal and round.       Ears:         Nose: No congestion/rhinnorhea.   Mouth/Throat: Mucous membranes are moist.   Neck: No stridor. Cardiovascular/Chest: Normal rate, irregularly irregular rhythm.  No murmurs, rubs, or gallops. Respiratory: Normal respiratory effort without tachypnea nor retractions. Breath sounds are clear and equal bilaterally. No wheezes/rales/rhonchi. Gastrointestinal: Soft. No distention, no guarding, no rebound. Nontender.    Genitourinary/rectal:Deferred Musculoskeletal: Nontender with normal  range of motion in all extremities. No joint effusions.  No lower extremity tenderness.  No edema. Neurologic:  Normal speech and language. No gross or focal neurologic deficits are appreciated. Skin:  Skin is warm, dry and intact. No rash noted. Psychiatric: Mood and affect are normal. Speech and behavior are normal. Patient exhibits appropriate insight and judgment.   ____________________________________________  LABS (pertinent positives/negatives) I, Lisa Roca, MD the attending physician have reviewed the labs noted below.  Labs Reviewed  BASIC METABOLIC PANEL - Abnormal; Notable for the following components:      Result Value   Chloride 116 (*)    CO2 20 (*)    BUN 55 (*)    Creatinine, Ser 3.88 (*)    Calcium 8.5 (*)    GFR calc non Af Amer 13 (*)    GFR calc Af Amer 15 (*)    All other components within normal limits  CBC WITH DIFFERENTIAL/PLATELET - Abnormal; Notable for the following components:   WBC 3.2 (*)    RBC 2.42 (*)    Hemoglobin 7.2 (*)    HCT 22.4 (*)    RDW 16.8 (*)    Lymphs Abs 0.6 (*)    All other components within normal limits  TROPONIN I -  Abnormal; Notable for the following components:   Troponin I 0.03 (*)    All other components within normal limits  PROTIME-INR - Abnormal; Notable for the following components:   Prothrombin Time 39.4 (*)    INR 4.09 (*)    All other components within normal limits  HEMOGLOBIN AND HEMATOCRIT, BLOOD  PREPARE RBC (CROSSMATCH)  TYPE AND SCREEN    ____________________________________________    EKG I, Lisa Roca, MD, the attending physician have personally viewed and interpreted all ECGs.  86 bpm, undetermined rhythm, irregularly irregular.  Nonspecific intraventricular conduction delay.  Occasional paced beats.  Nonspecific ST and T wave ____________________________________________  RADIOLOGY All Xrays were viewed by me.  Imaging interpreted by Radiologist, and I, Lisa Roca, MD the attending physician have reviewed the radiologist interpretation noted below.  Chest x-ray two-view:IMPRESSION: Interval worsening in the appearance of the right lung consistent with worsening pneumonia or less likely asymmetric pulmonary edema. Moderate-sized right pleural effusion with basilar atelectasis or infiltrate. If the patient is not exhibiting symptoms of pneumonia and/or CHF, chest CT scanning now may be indicated. __________________________________________  PROCEDURES  Procedure(s) performed: None  Critical Care performed: None  ____________________________________________  No current facility-administered medications on file prior to encounter.    Current Outpatient Medications on File Prior to Encounter  Medication Sig Dispense Refill  . amLODipine (NORVASC) 5 MG tablet Take 1 tablet (5 mg total) by mouth daily. 30 tablet 0  . aspirin EC 81 MG tablet Take 81 mg by mouth daily.    . cefUROXime (CEFTIN) 250 MG tablet Take 1 tablet (250 mg total) by mouth 2 (two) times daily. 10 tablet 0  . cyanocobalamin 500 MCG tablet Take 500 mcg by mouth 2 (two) times daily.    Marland Kitchen  docusate sodium (COLACE) 100 MG capsule Take 1 capsule (100 mg total) by mouth 2 (two) times daily as needed for mild constipation. 10 capsule 0  . Fluticasone-Salmeterol (ADVAIR) 250-50 MCG/DOSE AEPB Inhale 1 puff into the lungs 2 (two) times daily.    Marland Kitchen levothyroxine (SYNTHROID, LEVOTHROID) 25 MCG tablet Take 1 tablet (25 mcg total) by mouth daily before breakfast. 30 tablet 0  . Multiple Vitamins-Minerals (MULTIVITAMIN WITH MINERALS) tablet Take 1 tablet  by mouth daily.    . rosuvastatin (CRESTOR) 5 MG tablet Take 1 tablet (5 mg total) by mouth daily at 6 PM. 30 tablet   . tamsulosin (FLOMAX) 0.4 MG CAPS capsule Take 1 capsule (0.4 mg total) by mouth daily. 30 capsule 0  . warfarin (COUMADIN) 3 MG tablet Take 3 mg by mouth daily. Take as directed by anticoagulation clinic    . acetaminophen (TYLENOL) 325 MG tablet Take 1 tablet (325 mg total) by mouth every 6 (six) hours as needed for mild pain.    Marland Kitchen albuterol (PROVENTIL HFA;VENTOLIN HFA) 108 (90 Base) MCG/ACT inhaler Inhale 2 puffs into the lungs every 6 (six) hours as needed for wheezing or shortness of breath.    Marland Kitchen HYDROcodone-acetaminophen (NORCO/VICODIN) 5-325 MG tablet Take 1-2 tablets by mouth every 4 (four) hours as needed for moderate pain. (Patient not taking: Reported on 01/27/2017) 30 tablet 0    ____________________________________________  ED COURSE / ASSESSMENT AND PLAN  Pertinent labs & imaging results that were available during my care of the patient were reviewed by me and considered in my medical decision making (see chart for details).   Overall reported amount of hemoptysis sounds relatively small.  Because of hemoptysis, unclear, but given his prior history of supratherapeutic INR, and currently on Coumadin, may be hemoptysis related to that.  He is hemodynamic stable and not having any ongoing or acute symptoms.  Chest CT followed chest x-ray shows right-sided pneumonia, will cover with azithromycin.  He does not  appear septic.    Anemic to 7.2, he has had blood transitions in the past due to chronic anemia.  Most recent was around 7.7.  It seems that he is been drifting lower again.  No reported history of GI bleeding or black or bloody stools.  Discussed receiving emergency blood today in the emergency department with patient's PCP, and we will go ahead and give 1 unit here.  Otherwise he does not really have any indications that I feel he needs hospital admission for.  For elevated INR will asked patient to hold Coumadin times 2 days and have INR recheck on Friday.  Patient care transferred to Dr. Cherylann Banas at shift change 9 PM.  Patient awaiting repeat hemoglobin, if no change in plan, patient may be discharged with my prepared discharge instructions.  DIFFERENTIAL DIAGNOSIS: Including but not limited to pneumonia, PE, vomiting blood, supratherapeutic INR, nosebleed, lung cancer, etc.  CONSULTATIONS: I spoke with patient's PCP, Dr.Hande -agree that may not need hospitalization, but does recommend transfusion.  Agrees to see patient on Friday for repeat blood work and INR recheck.   Patient / Family / Caregiver informed of clinical course, medical decision-making process, and agree with plan.   I discussed return precautions, follow-up instructions, and discharge instructions with patient and/or family.  Discharge Instructions : You are evaluated for coughing up blood, and you are found to have slightly low hemoglobin which is anemia and was given 1 unit of blood here in the emergency department.  Your Coumadin blood thinning level was too thin, do not take your Coumadin tonight or tomorrow, you need to have a repeat blood draw at Dr. Linton Ham office on Friday.  Return to the emergency room immediately for any new or worsening condition including new or worsening coughing of blood, trouble breathing, shortness of breath, chest pain, fever, confusion altered mental status, or any other symptoms  concerning to you.  ___________________________________________   FINAL CLINICAL IMPRESSION(S) / ED DIAGNOSES   Final  diagnoses:  Hemoptysis  Community acquired pneumonia of right lung, unspecified part of lung  Supratherapeutic INR  Symptomatic anemia              Note: This dictation was prepared with Dragon dictation. Any transcriptional errors that result from this process are unintentional    Lisa Roca, MD 02/01/17 2123

## 2017-02-01 NOTE — ED Notes (Signed)
Spoke with EDP, per EDP give blood transfusion over 2 hours.

## 2017-02-01 NOTE — Discharge Instructions (Addendum)
You are evaluated for coughing up blood, and you are found to have slightly low hemoglobin which is anemia and was given 1 unit of blood here in the emergency department.  Your Coumadin blood thinning level was too thin, do not take your Coumadin tonight or tomorrow, you need to have a repeat blood draw at Dr. Linton Ham office on Friday.  Return to the emergency room immediately for any new or worsening condition including new or worsening coughing of blood, trouble breathing, shortness of breath, chest pain, fever, confusion altered mental status, or any other symptoms concerning to you.

## 2017-02-02 DIAGNOSIS — I130 Hypertensive heart and chronic kidney disease with heart failure and stage 1 through stage 4 chronic kidney disease, or unspecified chronic kidney disease: Secondary | ICD-10-CM | POA: Diagnosis not present

## 2017-02-02 DIAGNOSIS — D631 Anemia in chronic kidney disease: Secondary | ICD-10-CM | POA: Diagnosis not present

## 2017-02-02 DIAGNOSIS — I482 Chronic atrial fibrillation: Secondary | ICD-10-CM | POA: Diagnosis not present

## 2017-02-02 DIAGNOSIS — I69354 Hemiplegia and hemiparesis following cerebral infarction affecting left non-dominant side: Secondary | ICD-10-CM | POA: Diagnosis not present

## 2017-02-02 DIAGNOSIS — I10 Essential (primary) hypertension: Secondary | ICD-10-CM | POA: Diagnosis not present

## 2017-02-02 DIAGNOSIS — J449 Chronic obstructive pulmonary disease, unspecified: Secondary | ICD-10-CM | POA: Diagnosis not present

## 2017-02-02 DIAGNOSIS — I50810 Right heart failure, unspecified: Secondary | ICD-10-CM | POA: Diagnosis not present

## 2017-02-02 DIAGNOSIS — G959 Disease of spinal cord, unspecified: Secondary | ICD-10-CM | POA: Diagnosis not present

## 2017-02-02 DIAGNOSIS — N189 Chronic kidney disease, unspecified: Secondary | ICD-10-CM | POA: Diagnosis not present

## 2017-02-02 DIAGNOSIS — I2510 Atherosclerotic heart disease of native coronary artery without angina pectoris: Secondary | ICD-10-CM | POA: Diagnosis not present

## 2017-02-02 LAB — TYPE AND SCREEN
ABO/RH(D): A POS
Antibody Screen: NEGATIVE
Unit division: 0

## 2017-02-02 LAB — BPAM RBC
Blood Product Expiration Date: 201812052359
ISSUE DATE / TIME: 201811071736
Unit Type and Rh: 5100

## 2017-02-03 DIAGNOSIS — I10 Essential (primary) hypertension: Secondary | ICD-10-CM | POA: Diagnosis not present

## 2017-02-06 DIAGNOSIS — R531 Weakness: Secondary | ICD-10-CM | POA: Diagnosis not present

## 2017-02-06 DIAGNOSIS — I495 Sick sinus syndrome: Secondary | ICD-10-CM | POA: Diagnosis not present

## 2017-02-06 DIAGNOSIS — D649 Anemia, unspecified: Secondary | ICD-10-CM | POA: Diagnosis not present

## 2017-02-06 DIAGNOSIS — I10 Essential (primary) hypertension: Secondary | ICD-10-CM | POA: Diagnosis not present

## 2017-02-06 DIAGNOSIS — N183 Chronic kidney disease, stage 3 (moderate): Secondary | ICD-10-CM | POA: Diagnosis not present

## 2017-02-06 DIAGNOSIS — J181 Lobar pneumonia, unspecified organism: Secondary | ICD-10-CM | POA: Diagnosis not present

## 2017-02-06 DIAGNOSIS — J42 Unspecified chronic bronchitis: Secondary | ICD-10-CM | POA: Diagnosis not present

## 2017-02-06 DIAGNOSIS — I482 Chronic atrial fibrillation: Secondary | ICD-10-CM | POA: Diagnosis not present

## 2017-02-07 DIAGNOSIS — I10 Essential (primary) hypertension: Secondary | ICD-10-CM | POA: Diagnosis not present

## 2017-02-07 DIAGNOSIS — I495 Sick sinus syndrome: Secondary | ICD-10-CM | POA: Diagnosis not present

## 2017-02-08 DIAGNOSIS — I10 Essential (primary) hypertension: Secondary | ICD-10-CM | POA: Diagnosis not present

## 2017-02-09 DIAGNOSIS — I10 Essential (primary) hypertension: Secondary | ICD-10-CM | POA: Diagnosis not present

## 2017-02-10 DIAGNOSIS — I10 Essential (primary) hypertension: Secondary | ICD-10-CM | POA: Diagnosis not present

## 2017-02-13 DIAGNOSIS — I130 Hypertensive heart and chronic kidney disease with heart failure and stage 1 through stage 4 chronic kidney disease, or unspecified chronic kidney disease: Secondary | ICD-10-CM | POA: Diagnosis not present

## 2017-02-13 DIAGNOSIS — J449 Chronic obstructive pulmonary disease, unspecified: Secondary | ICD-10-CM | POA: Diagnosis not present

## 2017-02-13 DIAGNOSIS — I482 Chronic atrial fibrillation: Secondary | ICD-10-CM | POA: Diagnosis not present

## 2017-02-13 DIAGNOSIS — G959 Disease of spinal cord, unspecified: Secondary | ICD-10-CM | POA: Diagnosis not present

## 2017-02-13 DIAGNOSIS — I69354 Hemiplegia and hemiparesis following cerebral infarction affecting left non-dominant side: Secondary | ICD-10-CM | POA: Diagnosis not present

## 2017-02-13 DIAGNOSIS — I4891 Unspecified atrial fibrillation: Secondary | ICD-10-CM | POA: Diagnosis not present

## 2017-02-13 DIAGNOSIS — D631 Anemia in chronic kidney disease: Secondary | ICD-10-CM | POA: Diagnosis not present

## 2017-02-13 DIAGNOSIS — I2510 Atherosclerotic heart disease of native coronary artery without angina pectoris: Secondary | ICD-10-CM | POA: Diagnosis not present

## 2017-02-13 DIAGNOSIS — I10 Essential (primary) hypertension: Secondary | ICD-10-CM | POA: Diagnosis not present

## 2017-02-13 DIAGNOSIS — I50810 Right heart failure, unspecified: Secondary | ICD-10-CM | POA: Diagnosis not present

## 2017-02-13 DIAGNOSIS — N189 Chronic kidney disease, unspecified: Secondary | ICD-10-CM | POA: Diagnosis not present

## 2017-02-14 DIAGNOSIS — J90 Pleural effusion, not elsewhere classified: Secondary | ICD-10-CM | POA: Diagnosis not present

## 2017-02-14 DIAGNOSIS — R042 Hemoptysis: Secondary | ICD-10-CM | POA: Diagnosis not present

## 2017-02-14 DIAGNOSIS — I495 Sick sinus syndrome: Secondary | ICD-10-CM | POA: Diagnosis not present

## 2017-02-14 DIAGNOSIS — I482 Chronic atrial fibrillation: Secondary | ICD-10-CM | POA: Diagnosis not present

## 2017-02-14 DIAGNOSIS — I10 Essential (primary) hypertension: Secondary | ICD-10-CM | POA: Diagnosis not present

## 2017-02-14 DIAGNOSIS — G603 Idiopathic progressive neuropathy: Secondary | ICD-10-CM | POA: Diagnosis not present

## 2017-02-14 DIAGNOSIS — Z7901 Long term (current) use of anticoagulants: Secondary | ICD-10-CM | POA: Diagnosis not present

## 2017-02-14 DIAGNOSIS — J42 Unspecified chronic bronchitis: Secondary | ICD-10-CM | POA: Diagnosis not present

## 2017-02-14 DIAGNOSIS — N183 Chronic kidney disease, stage 3 (moderate): Secondary | ICD-10-CM | POA: Diagnosis not present

## 2017-02-15 DIAGNOSIS — I10 Essential (primary) hypertension: Secondary | ICD-10-CM | POA: Diagnosis not present

## 2017-02-20 DIAGNOSIS — I482 Chronic atrial fibrillation: Secondary | ICD-10-CM | POA: Diagnosis not present

## 2017-02-20 DIAGNOSIS — I69354 Hemiplegia and hemiparesis following cerebral infarction affecting left non-dominant side: Secondary | ICD-10-CM | POA: Diagnosis not present

## 2017-02-20 DIAGNOSIS — D631 Anemia in chronic kidney disease: Secondary | ICD-10-CM | POA: Diagnosis not present

## 2017-02-20 DIAGNOSIS — G959 Disease of spinal cord, unspecified: Secondary | ICD-10-CM | POA: Diagnosis not present

## 2017-02-20 DIAGNOSIS — I2510 Atherosclerotic heart disease of native coronary artery without angina pectoris: Secondary | ICD-10-CM | POA: Diagnosis not present

## 2017-02-20 DIAGNOSIS — N189 Chronic kidney disease, unspecified: Secondary | ICD-10-CM | POA: Diagnosis not present

## 2017-02-20 DIAGNOSIS — I10 Essential (primary) hypertension: Secondary | ICD-10-CM | POA: Diagnosis not present

## 2017-02-20 DIAGNOSIS — I130 Hypertensive heart and chronic kidney disease with heart failure and stage 1 through stage 4 chronic kidney disease, or unspecified chronic kidney disease: Secondary | ICD-10-CM | POA: Diagnosis not present

## 2017-02-20 DIAGNOSIS — J449 Chronic obstructive pulmonary disease, unspecified: Secondary | ICD-10-CM | POA: Diagnosis not present

## 2017-02-20 DIAGNOSIS — I50810 Right heart failure, unspecified: Secondary | ICD-10-CM | POA: Diagnosis not present

## 2017-02-21 DIAGNOSIS — I10 Essential (primary) hypertension: Secondary | ICD-10-CM | POA: Diagnosis not present

## 2017-02-22 DIAGNOSIS — I10 Essential (primary) hypertension: Secondary | ICD-10-CM | POA: Diagnosis not present

## 2017-02-24 DIAGNOSIS — I10 Essential (primary) hypertension: Secondary | ICD-10-CM | POA: Diagnosis not present

## 2017-02-27 DIAGNOSIS — S020XXA Fracture of vault of skull, initial encounter for closed fracture: Secondary | ICD-10-CM | POA: Diagnosis not present

## 2017-02-27 DIAGNOSIS — I10 Essential (primary) hypertension: Secondary | ICD-10-CM | POA: Diagnosis not present

## 2017-02-27 DIAGNOSIS — R32 Unspecified urinary incontinence: Secondary | ICD-10-CM | POA: Diagnosis not present

## 2017-02-28 DIAGNOSIS — I10 Essential (primary) hypertension: Secondary | ICD-10-CM | POA: Diagnosis not present

## 2017-03-01 DIAGNOSIS — I10 Essential (primary) hypertension: Secondary | ICD-10-CM | POA: Diagnosis not present

## 2017-03-02 DIAGNOSIS — I10 Essential (primary) hypertension: Secondary | ICD-10-CM | POA: Diagnosis not present

## 2017-03-03 ENCOUNTER — Emergency Department: Payer: Medicare HMO

## 2017-03-03 ENCOUNTER — Other Ambulatory Visit: Payer: Self-pay

## 2017-03-03 ENCOUNTER — Inpatient Hospital Stay
Admission: EM | Admit: 2017-03-03 | Discharge: 2017-03-28 | DRG: 377 | Disposition: E | Payer: Medicare HMO | Attending: Internal Medicine | Admitting: Internal Medicine

## 2017-03-03 DIAGNOSIS — I69354 Hemiplegia and hemiparesis following cerebral infarction affecting left non-dominant side: Secondary | ICD-10-CM | POA: Diagnosis not present

## 2017-03-03 DIAGNOSIS — Z950 Presence of cardiac pacemaker: Secondary | ICD-10-CM

## 2017-03-03 DIAGNOSIS — K922 Gastrointestinal hemorrhage, unspecified: Principal | ICD-10-CM | POA: Diagnosis present

## 2017-03-03 DIAGNOSIS — I739 Peripheral vascular disease, unspecified: Secondary | ICD-10-CM | POA: Diagnosis present

## 2017-03-03 DIAGNOSIS — R4182 Altered mental status, unspecified: Secondary | ICD-10-CM

## 2017-03-03 DIAGNOSIS — Z7951 Long term (current) use of inhaled steroids: Secondary | ICD-10-CM

## 2017-03-03 DIAGNOSIS — J9600 Acute respiratory failure, unspecified whether with hypoxia or hypercapnia: Secondary | ICD-10-CM

## 2017-03-03 DIAGNOSIS — I2510 Atherosclerotic heart disease of native coronary artery without angina pectoris: Secondary | ICD-10-CM | POA: Diagnosis present

## 2017-03-03 DIAGNOSIS — R579 Shock, unspecified: Secondary | ICD-10-CM | POA: Diagnosis not present

## 2017-03-03 DIAGNOSIS — D6832 Hemorrhagic disorder due to extrinsic circulating anticoagulants: Secondary | ICD-10-CM | POA: Diagnosis present

## 2017-03-03 DIAGNOSIS — D649 Anemia, unspecified: Secondary | ICD-10-CM

## 2017-03-03 DIAGNOSIS — I10 Essential (primary) hypertension: Secondary | ICD-10-CM | POA: Diagnosis not present

## 2017-03-03 DIAGNOSIS — R0602 Shortness of breath: Secondary | ICD-10-CM | POA: Diagnosis not present

## 2017-03-03 DIAGNOSIS — G9341 Metabolic encephalopathy: Secondary | ICD-10-CM | POA: Diagnosis not present

## 2017-03-03 DIAGNOSIS — E162 Hypoglycemia, unspecified: Secondary | ICD-10-CM | POA: Diagnosis present

## 2017-03-03 DIAGNOSIS — Z7982 Long term (current) use of aspirin: Secondary | ICD-10-CM

## 2017-03-03 DIAGNOSIS — J90 Pleural effusion, not elsewhere classified: Secondary | ICD-10-CM | POA: Diagnosis not present

## 2017-03-03 DIAGNOSIS — Z8249 Family history of ischemic heart disease and other diseases of the circulatory system: Secondary | ICD-10-CM

## 2017-03-03 DIAGNOSIS — D469 Myelodysplastic syndrome, unspecified: Secondary | ICD-10-CM | POA: Diagnosis present

## 2017-03-03 DIAGNOSIS — R6521 Severe sepsis with septic shock: Secondary | ICD-10-CM | POA: Diagnosis not present

## 2017-03-03 DIAGNOSIS — N179 Acute kidney failure, unspecified: Secondary | ICD-10-CM | POA: Diagnosis present

## 2017-03-03 DIAGNOSIS — J189 Pneumonia, unspecified organism: Secondary | ICD-10-CM | POA: Diagnosis not present

## 2017-03-03 DIAGNOSIS — N184 Chronic kidney disease, stage 4 (severe): Secondary | ICD-10-CM | POA: Diagnosis not present

## 2017-03-03 DIAGNOSIS — N400 Enlarged prostate without lower urinary tract symptoms: Secondary | ICD-10-CM | POA: Diagnosis present

## 2017-03-03 DIAGNOSIS — Z7189 Other specified counseling: Secondary | ICD-10-CM | POA: Diagnosis not present

## 2017-03-03 DIAGNOSIS — D62 Acute posthemorrhagic anemia: Secondary | ICD-10-CM | POA: Diagnosis present

## 2017-03-03 DIAGNOSIS — J9601 Acute respiratory failure with hypoxia: Secondary | ICD-10-CM | POA: Diagnosis not present

## 2017-03-03 DIAGNOSIS — T45515A Adverse effect of anticoagulants, initial encounter: Secondary | ICD-10-CM | POA: Diagnosis present

## 2017-03-03 DIAGNOSIS — K920 Hematemesis: Secondary | ICD-10-CM | POA: Diagnosis not present

## 2017-03-03 DIAGNOSIS — R34 Anuria and oliguria: Secondary | ICD-10-CM | POA: Diagnosis not present

## 2017-03-03 DIAGNOSIS — I4891 Unspecified atrial fibrillation: Secondary | ICD-10-CM | POA: Diagnosis present

## 2017-03-03 DIAGNOSIS — Z66 Do not resuscitate: Secondary | ICD-10-CM | POA: Diagnosis not present

## 2017-03-03 DIAGNOSIS — Z515 Encounter for palliative care: Secondary | ICD-10-CM | POA: Diagnosis not present

## 2017-03-03 DIAGNOSIS — J440 Chronic obstructive pulmonary disease with acute lower respiratory infection: Secondary | ICD-10-CM | POA: Diagnosis present

## 2017-03-03 DIAGNOSIS — A419 Sepsis, unspecified organism: Secondary | ICD-10-CM | POA: Diagnosis present

## 2017-03-03 DIAGNOSIS — Z87891 Personal history of nicotine dependence: Secondary | ICD-10-CM

## 2017-03-03 DIAGNOSIS — E785 Hyperlipidemia, unspecified: Secondary | ICD-10-CM | POA: Diagnosis present

## 2017-03-03 DIAGNOSIS — I469 Cardiac arrest, cause unspecified: Secondary | ICD-10-CM | POA: Diagnosis present

## 2017-03-03 DIAGNOSIS — T68XXXA Hypothermia, initial encounter: Secondary | ICD-10-CM | POA: Diagnosis present

## 2017-03-03 DIAGNOSIS — I468 Cardiac arrest due to other underlying condition: Secondary | ICD-10-CM | POA: Diagnosis present

## 2017-03-03 DIAGNOSIS — I5032 Chronic diastolic (congestive) heart failure: Secondary | ICD-10-CM | POA: Diagnosis present

## 2017-03-03 DIAGNOSIS — Z7901 Long term (current) use of anticoagulants: Secondary | ICD-10-CM

## 2017-03-03 DIAGNOSIS — I5030 Unspecified diastolic (congestive) heart failure: Secondary | ICD-10-CM | POA: Diagnosis not present

## 2017-03-03 DIAGNOSIS — E872 Acidosis: Secondary | ICD-10-CM | POA: Diagnosis present

## 2017-03-03 DIAGNOSIS — R571 Hypovolemic shock: Secondary | ICD-10-CM | POA: Diagnosis present

## 2017-03-03 DIAGNOSIS — E039 Hypothyroidism, unspecified: Secondary | ICD-10-CM | POA: Diagnosis present

## 2017-03-03 DIAGNOSIS — Z4682 Encounter for fitting and adjustment of non-vascular catheter: Secondary | ICD-10-CM | POA: Diagnosis not present

## 2017-03-03 DIAGNOSIS — G4733 Obstructive sleep apnea (adult) (pediatric): Secondary | ICD-10-CM | POA: Diagnosis present

## 2017-03-03 DIAGNOSIS — Z952 Presence of prosthetic heart valve: Secondary | ICD-10-CM

## 2017-03-03 DIAGNOSIS — E875 Hyperkalemia: Secondary | ICD-10-CM | POA: Diagnosis present

## 2017-03-03 DIAGNOSIS — R918 Other nonspecific abnormal finding of lung field: Secondary | ICD-10-CM | POA: Diagnosis not present

## 2017-03-03 DIAGNOSIS — I130 Hypertensive heart and chronic kidney disease with heart failure and stage 1 through stage 4 chronic kidney disease, or unspecified chronic kidney disease: Secondary | ICD-10-CM | POA: Diagnosis present

## 2017-03-03 DIAGNOSIS — D61818 Other pancytopenia: Secondary | ICD-10-CM | POA: Diagnosis present

## 2017-03-03 DIAGNOSIS — R570 Cardiogenic shock: Secondary | ICD-10-CM | POA: Diagnosis present

## 2017-03-03 DIAGNOSIS — Z452 Encounter for adjustment and management of vascular access device: Secondary | ICD-10-CM | POA: Diagnosis not present

## 2017-03-03 DIAGNOSIS — Z79890 Hormone replacement therapy (postmenopausal): Secondary | ICD-10-CM

## 2017-03-03 DIAGNOSIS — Z9911 Dependence on respirator [ventilator] status: Secondary | ICD-10-CM

## 2017-03-03 DIAGNOSIS — R0603 Acute respiratory distress: Secondary | ICD-10-CM

## 2017-03-03 DIAGNOSIS — J80 Acute respiratory distress syndrome: Secondary | ICD-10-CM | POA: Diagnosis not present

## 2017-03-03 LAB — CBC WITH DIFFERENTIAL/PLATELET
Basophils Absolute: 0 K/uL (ref 0–0.1)
Basophils Relative: 0 %
Eosinophils Absolute: 0 K/uL (ref 0–0.7)
Eosinophils Relative: 0 %
HCT: 21.2 % — ABNORMAL LOW (ref 40.0–52.0)
Hemoglobin: 6.7 g/dL — ABNORMAL LOW (ref 13.0–18.0)
Lymphocytes Relative: 6 %
Lymphs Abs: 0.1 K/uL — ABNORMAL LOW (ref 1.0–3.6)
MCH: 30.6 pg (ref 26.0–34.0)
MCHC: 31.8 g/dL — ABNORMAL LOW (ref 32.0–36.0)
MCV: 96.4 fL (ref 80.0–100.0)
Monocytes Absolute: 0.1 K/uL — ABNORMAL LOW (ref 0.2–1.0)
Monocytes Relative: 6 %
Neutro Abs: 1.9 K/uL (ref 1.4–6.5)
Neutrophils Relative %: 88 %
Platelets: 77 K/uL — ABNORMAL LOW (ref 150–440)
RBC: 2.2 MIL/uL — ABNORMAL LOW (ref 4.40–5.90)
RDW: 18.7 % — ABNORMAL HIGH (ref 11.5–14.5)
WBC: 2.2 K/uL — ABNORMAL LOW (ref 3.8–10.6)

## 2017-03-03 LAB — PHOSPHORUS
PHOSPHORUS: 4.2 mg/dL (ref 2.5–4.6)
Phosphorus: 4 mg/dL (ref 2.5–4.6)

## 2017-03-03 LAB — MAGNESIUM
Magnesium: 2.5 mg/dL — ABNORMAL HIGH (ref 1.7–2.4)
Magnesium: 2.4 mg/dL (ref 1.7–2.4)

## 2017-03-03 LAB — APTT: aPTT: 80 seconds — ABNORMAL HIGH (ref 24–36)

## 2017-03-03 LAB — BLOOD GAS, ARTERIAL
Acid-base deficit: 12.7 mmol/L — ABNORMAL HIGH (ref 0.0–2.0)
BICARBONATE: 14.5 mmol/L — AB (ref 20.0–28.0)
FIO2: 0.5
MECHVT: 600 mL
O2 SAT: 98.7 %
PATIENT TEMPERATURE: 37
PEEP/CPAP: 5 cmH2O
PH ART: 7.2 — AB (ref 7.350–7.450)
PO2 ART: 144 mmHg — AB (ref 83.0–108.0)
RATE: 15 resp/min
pCO2 arterial: 37 mmHg (ref 32.0–48.0)

## 2017-03-03 LAB — PREPARE RBC (CROSSMATCH)

## 2017-03-03 LAB — BASIC METABOLIC PANEL
ANION GAP: 6 (ref 5–15)
BUN: 72 mg/dL — ABNORMAL HIGH (ref 6–20)
CALCIUM: 8.1 mg/dL — AB (ref 8.9–10.3)
CHLORIDE: 117 mmol/L — AB (ref 101–111)
CO2: 15 mmol/L — AB (ref 22–32)
Creatinine, Ser: 3.76 mg/dL — ABNORMAL HIGH (ref 0.61–1.24)
GFR calc non Af Amer: 14 mL/min — ABNORMAL LOW (ref >60)
GFR, EST AFRICAN AMERICAN: 16 mL/min — AB (ref >60)
GLUCOSE: 95 mg/dL (ref 65–99)
POTASSIUM: 5.9 mmol/L — AB (ref 3.5–5.1)
Sodium: 138 mmol/L (ref 135–145)

## 2017-03-03 LAB — GLUCOSE, CAPILLARY: GLUCOSE-CAPILLARY: 106 mg/dL — AB (ref 65–99)

## 2017-03-03 LAB — COMPREHENSIVE METABOLIC PANEL WITH GFR
ALT: 52 U/L (ref 17–63)
AST: 82 U/L — ABNORMAL HIGH (ref 15–41)
Albumin: 2.6 g/dL — ABNORMAL LOW (ref 3.5–5.0)
Alkaline Phosphatase: 73 U/L (ref 38–126)
Anion gap: 6 (ref 5–15)
BUN: 73 mg/dL — ABNORMAL HIGH (ref 6–20)
CO2: 16 mmol/L — ABNORMAL LOW (ref 22–32)
Calcium: 8.9 mg/dL (ref 8.9–10.3)
Chloride: 115 mmol/L — ABNORMAL HIGH (ref 101–111)
Creatinine, Ser: 4.01 mg/dL — ABNORMAL HIGH (ref 0.61–1.24)
GFR calc Af Amer: 15 mL/min — ABNORMAL LOW
GFR calc non Af Amer: 13 mL/min — ABNORMAL LOW
Glucose, Bld: 134 mg/dL — ABNORMAL HIGH (ref 65–99)
Potassium: 6.2 mmol/L — ABNORMAL HIGH (ref 3.5–5.1)
Sodium: 137 mmol/L (ref 135–145)
Total Bilirubin: 0.5 mg/dL (ref 0.3–1.2)
Total Protein: 6 g/dL — ABNORMAL LOW (ref 6.5–8.1)

## 2017-03-03 LAB — TROPONIN I: TROPONIN I: 0.03 ng/mL — AB (ref <0.03)

## 2017-03-03 LAB — MRSA PCR SCREENING: MRSA by PCR: NEGATIVE

## 2017-03-03 LAB — PROTIME-INR
INR: 7.16
INR: 2.37
PROTHROMBIN TIME: 25.7 s — AB (ref 11.4–15.2)
Prothrombin Time: 61 s — ABNORMAL HIGH (ref 11.4–15.2)

## 2017-03-03 LAB — PROCALCITONIN: Procalcitonin: 0.46 ng/mL

## 2017-03-03 MED ORDER — PHENYLEPHRINE HCL 10 MG/ML IJ SOLN
0.0000 ug/min | Freq: Once | INTRAVENOUS | Status: AC
  Administered 2017-03-03: 20 ug/min via INTRAVENOUS
  Filled 2017-03-03: qty 1

## 2017-03-03 MED ORDER — POLYETHYLENE GLYCOL 3350 17 G PO PACK: 17.0000 g | PACK | Freq: Every day | ORAL | Status: DC | PRN

## 2017-03-03 MED ORDER — MIDAZOLAM HCL 5 MG/5ML IJ SOLN
INTRAMUSCULAR | Status: AC
  Filled 2017-03-03: qty 5

## 2017-03-03 MED ORDER — MIDAZOLAM HCL 5 MG/5ML IJ SOLN
INTRAMUSCULAR | Status: AC
  Administered 2017-03-03: 4 mg via INTRAVENOUS
  Filled 2017-03-03: qty 5

## 2017-03-03 MED ORDER — VITAMIN K1 10 MG/ML IJ SOLN
10.0000 mg | Freq: Once | INTRAVENOUS | Status: AC
  Administered 2017-03-04: 10 mg via INTRAVENOUS
  Filled 2017-03-03 (×2): qty 1

## 2017-03-03 MED ORDER — SODIUM CHLORIDE 0.9 % IV SOLN
80.0000 mg | Freq: Once | INTRAVENOUS | Status: AC
  Administered 2017-03-03: 80 mg via INTRAVENOUS
  Filled 2017-03-03: qty 80

## 2017-03-03 MED ORDER — SENNOSIDES 8.8 MG/5ML PO SYRP
5.0000 mL | ORAL_SOLUTION | Freq: Two times a day (BID) | ORAL | Status: DC | PRN
  Filled 2017-03-03: qty 5

## 2017-03-03 MED ORDER — ACETAMINOPHEN 650 MG RE SUPP: 650.0000 mg | Freq: Four times a day (QID) | RECTAL | Status: DC | PRN

## 2017-03-03 MED ORDER — ONDANSETRON HCL 4 MG PO TABS: 4.0000 mg | ORAL_TABLET | Freq: Four times a day (QID) | ORAL | Status: DC | PRN

## 2017-03-03 MED ORDER — MIDAZOLAM HCL 2 MG/2ML IJ SOLN
1.0000 mg | INTRAMUSCULAR | Status: AC | PRN
  Administered 2017-03-04: 1 mg via INTRAVENOUS
  Administered 2017-03-04 – 2017-03-05 (×2): 2 mg via INTRAVENOUS
  Filled 2017-03-03 (×3): qty 2

## 2017-03-03 MED ORDER — PROTHROMBIN COMPLEX CONC HUMAN 500 UNITS IV KIT: 2000.0000 [IU] | PACK | Freq: Once | INTRAVENOUS | Status: DC

## 2017-03-03 MED ORDER — SODIUM CHLORIDE 0.9 % IV BOLUS (SEPSIS)
1000.0000 mL | Freq: Once | INTRAVENOUS | Status: AC
  Administered 2017-03-03: 1000 mL via INTRAVENOUS

## 2017-03-03 MED ORDER — FENTANYL 2500MCG IN NS 250ML (10MCG/ML) PREMIX INFUSION
25.0000 ug/h | INTRAVENOUS | Status: DC
  Administered 2017-03-03: 50 ug/h via INTRAVENOUS
  Administered 2017-03-04 (×2): 200 ug/h via INTRAVENOUS
  Administered 2017-03-05: 125 ug/h via INTRAVENOUS
  Filled 2017-03-03 (×3): qty 250

## 2017-03-03 MED ORDER — ETOMIDATE 2 MG/ML IV SOLN: 0.6000 mg/kg | Freq: Once | INTRAVENOUS | Status: DC

## 2017-03-03 MED ORDER — LEVOFLOXACIN IN D5W 750 MG/150ML IV SOLN
750.0000 mg | Freq: Once | INTRAVENOUS | Status: AC
  Administered 2017-03-03: 750 mg via INTRAVENOUS
  Filled 2017-03-03: qty 150

## 2017-03-03 MED ORDER — FENTANYL CITRATE (PF) 100 MCG/2ML IJ SOLN
INTRAMUSCULAR | Status: AC
Start: 2017-03-03 — End: 2017-03-04
  Filled 2017-03-03: qty 2

## 2017-03-03 MED ORDER — ONDANSETRON HCL 4 MG/2ML IJ SOLN: 4.0000 mg | Freq: Four times a day (QID) | INTRAMUSCULAR | Status: DC | PRN

## 2017-03-03 MED ORDER — DOPAMINE-DEXTROSE 3.2-5 MG/ML-% IV SOLN
INTRAVENOUS | Status: AC
  Administered 2017-03-03: 22:00:00
  Filled 2017-03-03: qty 250

## 2017-03-03 MED ORDER — FENTANYL BOLUS VIA INFUSION
25.0000 ug | INTRAVENOUS | Status: DC | PRN
  Filled 2017-03-03: qty 50

## 2017-03-03 MED ORDER — PROTHROMBIN COMPLEX CONC HUMAN 500 UNITS IV KIT
1590.0000 [IU] | PACK | Freq: Once | Status: DC
  Filled 2017-03-03: qty 1064

## 2017-03-03 MED ORDER — PANTOPRAZOLE SODIUM 40 MG IV SOLR
8.0000 mg/h | INTRAVENOUS | Status: AC
  Administered 2017-03-03 – 2017-03-06 (×7): 8 mg/h via INTRAVENOUS
  Filled 2017-03-03 (×7): qty 80

## 2017-03-03 MED ORDER — PHENYLEPHRINE HCL 10 MG/ML IJ SOLN
0.0000 ug/min | INTRAVENOUS | Status: DC
  Administered 2017-03-03: 20 ug/min via INTRAVENOUS
  Filled 2017-03-03 (×2): qty 1

## 2017-03-03 MED ORDER — DEXTROSE 5 % IV SOLN
500.0000 mg | INTRAVENOUS | Status: DC
  Filled 2017-03-03: qty 500

## 2017-03-03 MED ORDER — MIDAZOLAM HCL 2 MG/2ML IJ SOLN
4.0000 mg | INTRAMUSCULAR | Status: DC | PRN
  Administered 2017-03-03 (×2): 4 mg via INTRAVENOUS

## 2017-03-03 MED ORDER — EMPTY CONTAINERS FLEXIBLE MISC
1578.0000 [IU] | Freq: Once | Status: DC
  Filled 2017-03-03: qty 1578

## 2017-03-03 MED ORDER — SODIUM CHLORIDE 0.9 % IV SOLN
1.0000 g | Freq: Once | INTRAVENOUS | Status: AC
  Administered 2017-03-04: 1 g via INTRAVENOUS
  Filled 2017-03-03: qty 10

## 2017-03-03 MED ORDER — ALBUTEROL SULFATE (2.5 MG/3ML) 0.083% IN NEBU: 2.5000 mg | INHALATION_SOLUTION | RESPIRATORY_TRACT | Status: DC | PRN

## 2017-03-03 MED ORDER — ETOMIDATE 2 MG/ML IV SOLN
0.3000 mg/kg | Freq: Once | INTRAVENOUS | Status: AC
  Administered 2017-03-03: 21 mg via INTRAVENOUS

## 2017-03-03 MED ORDER — PANTOPRAZOLE SODIUM 40 MG IV SOLR: 40.0000 mg | Freq: Two times a day (BID) | INTRAVENOUS | Status: DC

## 2017-03-03 MED ORDER — DEXTROSE 5 % IV SOLN: 10.0000 mg | Freq: Once | INTRAVENOUS | Status: DC

## 2017-03-03 MED ORDER — ROCURONIUM BROMIDE 50 MG/5ML IV SOLN
0.6000 mg/kg | Freq: Once | INTRAVENOUS | Status: AC
  Administered 2017-03-03: 42 mg via INTRAVENOUS
  Filled 2017-03-03: qty 4.2

## 2017-03-03 MED ORDER — PROTHROMBIN COMPLEX CONC HUMAN 500 UNITS IV KIT
1595.0000 [IU] | PACK | Freq: Once | Status: AC
  Administered 2017-03-03: 1595 [IU] via INTRAVENOUS
  Filled 2017-03-03: qty 1595

## 2017-03-03 MED ORDER — BISACODYL 10 MG RE SUPP: 10.0000 mg | Freq: Every day | RECTAL | Status: DC | PRN

## 2017-03-03 MED ORDER — FENTANYL CITRATE (PF) 2500 MCG/50ML IJ SOLN: 0.0000 ug/h | INTRAMUSCULAR | Status: DC

## 2017-03-03 MED ORDER — SODIUM CHLORIDE 0.9 % IV SOLN
10.0000 mL/h | Freq: Once | INTRAVENOUS | Status: AC
  Administered 2017-03-03: 10 mL/h via INTRAVENOUS

## 2017-03-03 MED ORDER — MIDAZOLAM HCL 2 MG/2ML IJ SOLN
1.0000 mg | INTRAMUSCULAR | Status: DC | PRN
  Administered 2017-03-07 – 2017-03-08 (×5): 2 mg via INTRAVENOUS
  Filled 2017-03-03 (×5): qty 2

## 2017-03-03 MED ORDER — SODIUM CHLORIDE 0.9% FLUSH
3.0000 mL | Freq: Two times a day (BID) | INTRAVENOUS | Status: DC
  Administered 2017-03-03 – 2017-03-06 (×4): 3 mL via INTRAVENOUS
  Administered 2017-03-06: 10 mL via INTRAVENOUS
  Administered 2017-03-07 – 2017-03-08 (×3): 3 mL via INTRAVENOUS

## 2017-03-03 MED ORDER — FENTANYL 2500MCG IN NS 250ML (10MCG/ML) PREMIX INFUSION
0.0000 ug/h | INTRAVENOUS | Status: DC
  Administered 2017-03-03: 25 ug/h via INTRAVENOUS
  Filled 2017-03-03: qty 250

## 2017-03-03 MED ORDER — ACETAMINOPHEN 325 MG PO TABS: 650.0000 mg | ORAL_TABLET | Freq: Four times a day (QID) | ORAL | Status: DC | PRN

## 2017-03-03 MED ORDER — DEXTROSE 5 % IV SOLN
1.0000 g | INTRAVENOUS | Status: DC
  Administered 2017-03-03: 1 g via INTRAVENOUS
  Filled 2017-03-03 (×2): qty 10

## 2017-03-03 MED ORDER — DOPAMINE-DEXTROSE 3.2-5 MG/ML-% IV SOLN
0.0000 ug/kg/min | INTRAVENOUS | Status: DC
  Administered 2017-03-03: 10 ug/kg/min via INTRAVENOUS

## 2017-03-03 MED ORDER — FENTANYL CITRATE (PF) 100 MCG/2ML IJ SOLN
100.0000 ug | Freq: Once | INTRAMUSCULAR | Status: AC
  Administered 2017-03-03: 100 ug via INTRAVENOUS

## 2017-03-03 MED ORDER — ROCURONIUM BROMIDE 50 MG/5ML IV SOLN
0.3000 mg/kg | Freq: Once | INTRAVENOUS | Status: DC
  Filled 2017-03-03: qty 2.1

## 2017-03-03 NOTE — Progress Notes (Signed)
eLink Physician-Brief Progress Note Patient Name: Kyle Vasquez DOB: 25-Feb-1932 MRN: [id number]   Date of Service  03/26/2017  HPI/Events of Note  3 M with known history of diastolic CHF, AF on Coumadin, complained of some shortness of breath to his family and then collapsed.  EMS was summoned.  He was found to have cardiac arrest and had CPR for 6 minutes and 2 rounds of epinephrine.  Was brought to the emergency room with a King airway and had to be intubated.  He was slowly waking up but not protecting his airway.  Core temperature was found to be 83 F.  He was also found to have anion gap metabolic acidosis, anemia, elevated INR at 7 and NG tube showing blood.  He was found to have a Hgb of 6.7 and  received 2 units of blood.  Received a dose of Versed for sedation.  Later was hypotensive and had to be started on dopamine.  Now is on NEO for BP support.    On camera check the patient is intubated and sedated with Fentanyl.  He is currently on 40 mcg of NEO with titration ongoing.  Current BP of 65/51 (57), HR of 60, RR of 20 and sats of 94%.  Of note his potassium on presentation was 6.2 with some AoCRI.     eICU Interventions  Plan of care per primary admitting team. Vent/sedation orders in place Will give 1 amp of Calcium Gluconate given potassium.  Unclear if the arrhthymia was related to hyperkalemia Continue to monitor via [facility]     Intervention Category Evaluation Type: New Patient Evaluation  DETERDING,ELIZABETH 03/12/2017, 10:52 PM

## 2017-03-03 NOTE — ED Notes (Signed)
Bair hugger on pt

## 2017-03-03 NOTE — H&P (Signed)
[facility] NAME: Kyle Vasquez    MR#:  [id number]  DATE OF BIRTH:  09/07/1931  DATE OF ADMISSION:  02/26/2017  PRIMARY CARE PHYSICIAN: Tracie Harrier, MD   REQUESTING/REFERRING PHYSICIAN: Dr. Archie Balboa  CHIEF COMPLAINT:   Chief Complaint  Patient presents with  . Respiratory Distress    HISTORY OF PRESENT ILLNESS:  Kyle Vasquez  is a 83 y.o. male with a known history of diastolic congestive heart failure, atrial fibrillation on Coumadin, complained of some shortness of breath to his family and then collapsed.  EMS was summoned.  He was found to have cardiac arrest and had CPR for 6 minutes and 2 rounds of epinephrine.  Was brought to the emergency room with a King airway and had to be intubated.  He was slowly waking up but not protecting his airway.  Core temperature was found to be 83 F.  He was also found to have anion gap metabolic acidosis, anemia, elevated INR at 7 and NG tube showing blood.  Patient received 2 units of blood.  Received a dose of Versed for sedation.  Later was hypotensive and had to be started on dopamine.  PAST MEDICAL HISTORY:   Past Medical History:  Diagnosis Date  . Anemia   . Atrial fibrillation ([place])   . Bradycardia   . Chronic kidney disease   . COPD (chronic obstructive pulmonary disease) ([place])   . Coronary artery disease   . Depression   . Heart murmur   . HLD (hyperlipidemia)   . Hypertension   . Hypothyroidism   . Lumbar myelopathy ([place])   . MDS (myelodysplastic syndrome) ([place])   . OSA (obstructive sleep apnea)   . Stroke [facility])    left sided weakness  . Syncope     PAST SURGICAL HISTORY:   Past Surgical History:  Procedure Laterality Date  . ABDOMINAL AORTIC ANEURYSM REPAIR    . BACK SURGERY    . CARDIAC SURGERY     mitral valve surgery  . CARDIAC VALVE REPLACEMENT    . NECK SURGERY    . PACEMAKER INSERTION Left 07/20/2016   Procedure: INSERTION PACEMAKER;  Surgeon:  Isaias Cowman, MD;  Location: [facility] ORS;  Service: Cardiovascular;  Laterality: Left;    SOCIAL HISTORY:   Social History   Tobacco Use  . Smoking status: Former Research scientist (life sciences)  . Smokeless tobacco: Never Used  Substance Use Topics  . Alcohol use: No    FAMILY HISTORY:   Family History  Problem Relation Age of Onset  . Hypertension Mother   . Congestive Heart Failure Father   . Hypertension Father     DRUG ALLERGIES:  No Known Allergies  REVIEW OF SYSTEMS:   Review of Systems  Unable to perform ROS: Intubated    MEDICATIONS AT HOME:   Prior to Admission medications   Medication Sig Start Date End Date Taking? Authorizing Provider  acetaminophen (TYLENOL) 325 MG tablet Take 1 tablet (325 mg total) by mouth every 6 (six) hours as needed for mild pain. 12/14/16   Gouru, Illene Silver, MD  albuterol (PROVENTIL HFA;VENTOLIN HFA) 108 (90 Base) MCG/ACT inhaler Inhale 2 puffs into the lungs every 6 (six) hours as needed for wheezing or shortness of breath.    [provider]  amLODipine (NORVASC) 5 MG tablet Take 1 tablet (5 mg total) by mouth daily. 07/01/16   Fritzi Mandes, MD  aspirin EC 81 MG tablet Take 81 mg by mouth daily.  [provider]  azithromycin (ZITHROMAX) 250 MG tablet 250mg  once daily for 4 more days 02/01/17   Lisa Roca, MD  cyanocobalamin 500 MCG tablet Take 500 mcg by mouth 2 (two) times daily.    [provider]  docusate sodium (COLACE) 100 MG capsule Take 1 capsule (100 mg total) by mouth 2 (two) times daily as needed for mild constipation. 12/14/16   Gouru, Illene Silver, MD  Fluticasone-Salmeterol (ADVAIR) 250-50 MCG/DOSE AEPB Inhale 1 puff into the lungs 2 (two) times daily.    [provider]  HYDROcodone-acetaminophen (NORCO/VICODIN) 5-325 MG tablet Take 1-2 tablets by mouth every 4 (four) hours as needed for moderate pain. Patient not taking: Reported on 01/27/2017 09/21/16   Epifanio Lesches, MD  levothyroxine (SYNTHROID,  LEVOTHROID) 25 MCG tablet Take 1 tablet (25 mcg total) by mouth daily before breakfast. 07/22/16   Clabe Seal, PA-C  Multiple Vitamins-Minerals (MULTIVITAMIN WITH MINERALS) tablet Take 1 tablet by mouth daily.    [provider]  rosuvastatin (CRESTOR) 5 MG tablet Take 1 tablet (5 mg total) by mouth daily at 6 PM. 07/21/16   Clabe Seal, PA-C  tamsulosin (FLOMAX) 0.4 MG CAPS capsule Take 1 capsule (0.4 mg total) by mouth daily. 01/28/17   Vaughan Basta, MD  warfarin (COUMADIN) 3 MG tablet Take 3 mg by mouth daily. Take as directed by anticoagulation clinic    [provider]     VITAL SIGNS:  Blood pressure (!) 89/62, pulse (!) 59, temperature (!) 87.2 F (30.7 C), resp. rate 20, height 6\' 5"  (1.956 m), weight 72.1 kg (159 lb), SpO2 96 %.  PHYSICAL EXAMINATION:  Physical Exam  GENERAL:  83 y.o.-year-old patient lying in the bed .  Looks critically ill EYES: Pupils equal, round, reactive to light and accommodation.  HEENT: ET tube in place NECK:  Supple, no jugular venous distention.  LUNGS: Bilateral wheezing CARDIOVASCULAR: S1, S2  ABDOMEN: Soft, , nondistended. Bowel sounds present.  EXTREMITIES: No pedal edema PSYCHIATRIC: The patient is sedated SKIN: No obvious rash, lesion, or ulcer.   LABORATORY PANEL:   CBC Recent Labs  Lab 02/27/2017 1827  WBC 2.2*  HGB 6.7*  HCT 21.2*  PLT 77*   ------------------------------------------------------------------------------------------------------------------  Chemistries  Recent Labs  Lab 03/05/2017 1827  NA 137  K 6.2*  CL 115*  CO2 16*  GLUCOSE 134*  BUN 73*  CREATININE 4.01*  CALCIUM 8.9  AST 82*  ALT 52  ALKPHOS 73  BILITOT 0.5   ------------------------------------------------------------------------------------------------------------------  Cardiac Enzymes Recent Labs  Lab 03/17/2017 1827  TROPONINI 0.03*    ------------------------------------------------------------------------------------------------------------------  RADIOLOGY:  Dg Abdomen 1 View  Result Date: 03/01/2017 CLINICAL DATA:  Encounter for orogastric tube placement. EXAM: ABDOMEN - 1 VIEW COMPARISON:  None. FINDINGS: The tip and side port of a gastric tube are seen in the expected location of the stomach. The included heart is enlarged. Pleural thickening and/or small loculated fluid is noted along the periphery the right hemithorax. Partially visualized distal aortic stent graft. Lumbar fusion hardware noted at L2-3. No radio-opaque calculi or other significant radiographic abnormality are seen. IMPRESSION: 1. Gastric tube in the expected location of the stomach. 2. Cardiomegaly with pleural thickening versus small loculated pleural fluid. Electronically Signed   By: Ashley Royalty M.D.   On: 03/16/2017 19:04   Dg Chest Portable 1 View  Result Date: 03/18/2017 CLINICAL DATA:  Patient found on floor by family in with agonal breathing. EXAM: PORTABLE CHEST 1 VIEW COMPARISON:  02/01/2017 FINDINGS: Cardiomegaly with  aortic atherosclerosis. Endotracheal tube is noted 4.3 cm above the carina in satisfactory position. Pulmonary vascular redistribution is seen consistent with pulmonary edema. Superimposed airspace opacities in the periphery of the right upper and central left upper lobes cannot concomitant pneumonia. No left effusion. The right lateral costophrenic angle is included as part of the abdomen radiographs acquired sequentially with this exam. There does appear to be pleural thickening or a small fluid loculation along the periphery of the right hemithorax. Spinal fusion hardware noted of the included lower cervical spine. External defibrillator paddles project over the cardiac silhouette. No acute osseous abnormality. IMPRESSION: 1. Cardiomegaly with aortic atherosclerosis. Pleural thickening versus small amount of loculated fluid along  the periphery of the visualized right hemithorax. 2. Satisfactory endotracheal tube position. 3. Diffuse pulmonary vascular redistribution consistent with pulmonary edema. 4. Superimposed pneumonia in the upper lobes is not excluded. Electronically Signed   By: Ashley Royalty M.D.   On: 03/16/2017 18:53     IMPRESSION AND PLAN:   * Cardiac arrest likely due to hypotension from GI bleed Continue full ventilatory support.  Will admit to ICU.  Discussed with intensivist. Patient was waking up while being intubated.  No need for hypothermia protocol.  *Hypovolemic shock due to GI bleed.  2 units of blood transfused.  Start dopamine.  Fluid resuscitation.  *Metabolic acidosis due to acute renal failure and likely lactic acidosis.  Repeat labs.  *GI bleed.  2 units transfused.  Start Protonix drip.  Consult GI.  *Supratherapeutic INR while being on Coumadin.  Coumadin held.  Patient received vitamin K and K Centra.  Patient is critically ill with high risk for cardiac arrest and death.  All the records are reviewed and case discussed with ED provider. Management plans discussed with the patient, family and they are in agreement.  CODE STATUS: FULL CODE  TOTAL CC TIME TAKING CARE OF THIS PATIENT: 40 minutes.   Leia Alf Marisabel Macpherson M.D on 03/05/2017 at 7:43 PM  Between 7am to 6pm - Pager - [phone]  After 6pm go to [email or web address] - password [email or web address] [facility]  Office  [phone]  CC: Primary care physician; Tracie Harrier, MD  Note: This dictation was prepared with Dragon dictation along with smaller phrase technology. Any transcriptional errors that result from this process are unintentional.

## 2017-03-03 NOTE — ED Provider Notes (Signed)
[facility] Emergency Department Provider Note  ____________________________________________   I have reviewed the triage vital signs and the nursing notes.   HISTORY  Chief Complaint Respiratory Distress   History limited by and level 5 caveat due to: Altered Mental Status   HPI Kyle Vasquez is a 83 y.o. male who presents to the emergency department today via EMS as emergency traffic because of respiratory distress and unresponsiveness. Per report family found the patient on the floor today. The patient was having difficulty with his breathing. When EMS arrived they described the patient has having agonal breathing. Initially a weak pulse. During transport they did initiate CPR. State roughly 6 minutes of CPR. King airway was placed.    Per medical record review patient has a history of COPD, CKD, CAD.  Past Medical History:  Diagnosis Date  . Anemia   . Atrial fibrillation ([place])   . Bradycardia   . Chronic kidney disease   . COPD (chronic obstructive pulmonary disease) ([place])   . Coronary artery disease   . Depression   . Heart murmur   . HLD (hyperlipidemia)   . Hypertension   . Hypothyroidism   . Lumbar myelopathy ([place])   . MDS (myelodysplastic syndrome) ([place])   . OSA (obstructive sleep apnea)   . Stroke [facility])    left sided weakness  . Syncope     Patient Active Problem List   Diagnosis Date Noted  . UTI (urinary tract infection) 01/27/2017  . Hematoma 12/12/2016  . Carotid atherosclerosis, bilateral 11/25/2016  . PVD (peripheral vascular disease) ([place]) 11/25/2016  . AAA (abdominal aortic aneurysm) without rupture ([place]) 11/25/2016  . Elevated troponin 09/20/2016  . Sick sinus syndrome ([place]) 07/20/2016  . Syncope 06/29/2016  . ARF (acute renal failure) ([place]) 06/29/2016  . Bradycardia 09/08/2015  . Symptomatic bradycardia 09/07/2015  . Syncope and collapse 09/07/2015  . Atrial fibrillation ([place]) 09/07/2015  . HTN (hypertension)  09/07/2015  . Physical deconditioning 09/07/2015  . H/O: CVA (cerebrovascular accident) 09/07/2015    Past Surgical History:  Procedure Laterality Date  . ABDOMINAL AORTIC ANEURYSM REPAIR    . BACK SURGERY    . CARDIAC SURGERY     mitral valve surgery  . CARDIAC VALVE REPLACEMENT    . NECK SURGERY    . PACEMAKER INSERTION Left 07/20/2016   Procedure: INSERTION PACEMAKER;  Surgeon: Isaias Cowman, MD;  Location: [facility] ORS;  Service: Cardiovascular;  Laterality: Left;    Prior to Admission medications   Medication Sig Start Date End Date Taking? Authorizing Provider  acetaminophen (TYLENOL) 325 MG tablet Take 1 tablet (325 mg total) by mouth every 6 (six) hours as needed for mild pain. 12/14/16   Gouru, Illene Silver, MD  albuterol (PROVENTIL HFA;VENTOLIN HFA) 108 (90 Base) MCG/ACT inhaler Inhale 2 puffs into the lungs every 6 (six) hours as needed for wheezing or shortness of breath.    [provider]  amLODipine (NORVASC) 5 MG tablet Take 1 tablet (5 mg total) by mouth daily. 07/01/16   Fritzi Mandes, MD  aspirin EC 81 MG tablet Take 81 mg by mouth daily.    [provider]  azithromycin (ZITHROMAX) 250 MG tablet 250mg  once daily for 4 more days 02/01/17   Lisa Roca, MD  cyanocobalamin 500 MCG tablet Take 500 mcg by mouth 2 (two) times daily.    [provider]  docusate sodium (COLACE) 100 MG capsule Take 1 capsule (100 mg total) by mouth 2 (two) times daily as needed for  mild constipation. 12/14/16   Gouru, Illene Silver, MD  Fluticasone-Salmeterol (ADVAIR) 250-50 MCG/DOSE AEPB Inhale 1 puff into the lungs 2 (two) times daily.    [provider]  HYDROcodone-acetaminophen (NORCO/VICODIN) 5-325 MG tablet Take 1-2 tablets by mouth every 4 (four) hours as needed for moderate pain. Patient not taking: Reported on 01/27/2017 09/21/16   Epifanio Lesches, MD  levothyroxine (SYNTHROID, LEVOTHROID) 25 MCG tablet Take 1 tablet (25 mcg total) by mouth daily before  breakfast. 07/22/16   Clabe Seal, PA-C  Multiple Vitamins-Minerals (MULTIVITAMIN WITH MINERALS) tablet Take 1 tablet by mouth daily.    [provider]  rosuvastatin (CRESTOR) 5 MG tablet Take 1 tablet (5 mg total) by mouth daily at 6 PM. 07/21/16   Clabe Seal, PA-C  tamsulosin (FLOMAX) 0.4 MG CAPS capsule Take 1 capsule (0.4 mg total) by mouth daily. 01/28/17   Vaughan Basta, MD  warfarin (COUMADIN) 3 MG tablet Take 3 mg by mouth daily. Take as directed by anticoagulation clinic    [provider]    Allergies Patient has no known allergies.  Family History  Problem Relation Age of Onset  . Hypertension Mother   . Congestive Heart Failure Father   . Hypertension Father     Social History Social History   Tobacco Use  . Smoking status: Former Research scientist (life sciences)  . Smokeless tobacco: Never Used  Substance Use Topics  . Alcohol use: No  . Drug use: No    Review of Systems Unable to obtain given medical condition.  ____________________________________________   PHYSICAL EXAM:  VITAL SIGNS: ED Triage Vitals  Enc Vitals Group     BP 03/19/2017 1821 125/73     Pulse Rate 03/12/2017 1821 60     Resp 03/22/2017 1821 19     Temp 02/25/2017 1830 (!) 85.8 F (29.9 C)     Temp src --      SpO2 03/19/2017 1821 100 %     Weight 03/21/2017 1822 159 lb (72.1 kg)     Height 03/15/2017 1822 6\' 5"  (1.956 m)   Constitutional: King airway in place. Patient will open eyes to loud stimuli. Cold. Eyes: Conjunctivae are normal. PERRL.  ENT   Head: Normocephalic and atraumatic.   Nose: No congestion/rhinnorhea.   Mouth/Throat: King airway in place.   Neck: No stridor. Hematological/Lymphatic/Immunilogical: No cervical lymphadenopathy. Cardiovascular: Normal rate, regular rhythm.  No murmurs, rubs, or gallops.  Respiratory: King airway in place. Spontaneous respirations noted. Gastrointestinal: Soft and non tender. No rebound. No guarding.  Genitourinary:  Deferred Musculoskeletal: No lower extremity edema. Neurologic: Opens eye to verbal stimuli. Tries to remove painful stimuli. Skin:  Skin is warm, dry and intact. No rash noted.  ____________________________________________    LABS (pertinent positives/negatives)  CBC wbc 2.2, hgb 6.7, plt 77 INR 7.16 Trop 0.03 CMP k 6.2, cr 4.01, glu 134 ABG pH 7.20 ____________________________________________   EKG  I, Nance Pear, attending physician, personally viewed and interpreted this EKG  EKG Time: 1844 Rate: 70 Rhythm: a-v dual paced copmlexes Axis: left axis deviation Intervals: qtc 580 QRS: wide ST changes: no st elevation equivalent Impression: abnormal ekg   ____________________________________________    RADIOLOGY  CXR ET tube in okay position Concern for some edema  KUB G tube in place  I, Cherysh Epperly, personally viewed and evaluated these images (plain radiographs) as part of my medical decision making. ____________________________________________   PROCEDURES  Procedures  CRITICAL CARE Performed by: Nance Pear   Total critical care time: 50 minutes  Critical care time was exclusive of separately billable procedures and treating other patients.  Critical care was necessary to treat or prevent imminent or life-threatening deterioration.  Critical care was time spent personally by me on the following activities: development of treatment plan with patient and/or surrogate as well as nursing, discussions with consultants, evaluation of patient's response to treatment, examination of patient, obtaining history from patient or surrogate, ordering and performing treatments and interventions, ordering and review of laboratory studies, ordering and review of radiographic studies, pulse oximetry and re-evaluation of patient's condition.  INTUBATION Performed by: Nance Pear  Required items: required blood products, implants, devices, and  special equipment available Patient identity confirmed: provided demographic data and hospital-assigned identification number Time out: Immediately prior to procedure a "time out" was called to verify the correct patient, procedure, equipment, support staff and site/side marked as required.  Indications: cardiac arrest, unresponsive  Intubation method: Glidescope   Preoxygenation: BVM  Sedatives: Etomidate Paralytic: Roceronium  Tube Size: 7.5 cuffed  Post-procedure assessment: chest rise and ETCO2 monitor Breath sounds: equal and absent over the epigastrium Tube secured with: ETT holder Chest x-ray interpreted by radiologist and me.  Chest x-ray findings: endotracheal tube in appropriate position  Patient tolerated the procedure well with no immediate complications.    ____________________________________________   INITIAL IMPRESSION / ASSESSMENT AND PLAN / ED COURSE  Pertinent labs & imaging results that were available during my care of the patient were reviewed by me and considered in my medical decision making (see chart for details).  Patient presented via EMS as emergency traffic.  Patient did undergo CPR with EMS.  Upon arrival patient was very minimally responsive.  However given history of COPD and CHF and given family's initial concern for respiratory distress I had concern that primary [place] respiratory failure.  King area was switched out for ET tube.  In addition the patient was found to be significantly hypothermic.  Bear hugger and warm fluids were started.  Furthermore once NG tube was placed bloody contents were suctioned out.  This was not bright red blood but darker.  However given blood in that fact the patient was on warfarin emergency blood was ordered.  INR did come back significantly elevated at 7.  Kcentra and vitamin K were ordered.  I discussed with the family the patient's current situation.  Will be admitted to the hospitalist service and ICU level of  care.  ____________________________________________   FINAL CLINICAL IMPRESSION(S) / ED DIAGNOSES  Final diagnoses:  Respiratory distress  Anemia, unspecified type  Gastrointestinal hemorrhage, unspecified gastrointestinal hemorrhage type  Altered mental status, unspecified altered mental status type  Hypothermia, initial encounter     Note: This dictation was prepared with Dragon dictation. Any transcriptional errors that result from this process are unintentional     Nance Pear, MD 03/04/17 [id number]

## 2017-03-03 NOTE — ED Notes (Signed)
Report to Levada Dy, RN by Warren Lacy, RN

## 2017-03-03 NOTE — ED Notes (Signed)
Warm fluids started 

## 2017-03-03 NOTE — ED Triage Notes (Addendum)
Pt came to ED via EMS from home. Pt found on floor by family. PT was agonal breathing on EMS arrival, witnessed arrest. CPR done for approximately 6 minutes. 2 round of epi given. King airway placed by EMS. bp 125/72 on arrival with a pulse of 64. cbg 124.

## 2017-03-03 NOTE — ED Notes (Signed)
Admitting MD consulted regarding pt BP, orders for fluids only, no pressors at this time. This RN called CCU and CCU states pt needs to be more stabilized prior to transfer to ccu.

## 2017-03-03 NOTE — Progress Notes (Signed)
ANTIBIOTIC CONSULT NOTE - INITIAL  Pharmacy Consult for Ceftriaxone , Azithromycin  Indication: pneumonia  No Known Allergies  Patient Measurements: Height: 6\' 5"  (195.6 cm) Weight: 159 lb (72.1 kg) IBW/kg (Calculated) : 89.1 Adjusted Body Weight:   Vital Signs: Temp: 90.4 F (32.4 C) (12/07 2025) BP: 56/46 (12/07 2025) Pulse Rate: 60 (12/07 2025) Intake/Output from previous day: No intake/output data recorded. Intake/Output from this shift: Total I/O In: 903 [I.V.:3; IV Piggyback:900] Out: -   Labs: Recent Labs    02/26/2017 1827  WBC 2.2*  HGB 6.7*  PLT 77*  CREATININE 4.01*   Estimated Creatinine Clearance: 14.2 mL/min (A) (by C-G formula based on SCr of 4.01 mg/dL (H)). No results for input(s): VANCOTROUGH, VANCOPEAK, VANCORANDOM, GENTTROUGH, GENTPEAK, GENTRANDOM, TOBRATROUGH, TOBRAPEAK, TOBRARND, AMIKACINPEAK, AMIKACINTROU, AMIKACIN in the last 72 hours.   Microbiology: No results found for this or any previous visit (from the past 720 hour(s)).  Medical History: Past Medical History:  Diagnosis Date  . Anemia   . Atrial fibrillation ([place])   . Bradycardia   . Chronic kidney disease   . COPD (chronic obstructive pulmonary disease) ([place])   . Coronary artery disease   . Depression   . Heart murmur   . HLD (hyperlipidemia)   . Hypertension   . Hypothyroidism   . Lumbar myelopathy ([place])   . MDS (myelodysplastic syndrome) ([place])   . OSA (obstructive sleep apnea)   . Stroke [facility])    left sided weakness  . Syncope     Medications:  Medications Prior to Admission  Medication Sig Dispense Refill Last Dose  . acetaminophen (TYLENOL) 325 MG tablet Take 1 tablet (325 mg total) by mouth every 6 (six) hours as needed for mild pain.   prn at prn  . albuterol (PROVENTIL HFA;VENTOLIN HFA) 108 (90 Base) MCG/ACT inhaler Inhale 2 puffs into the lungs every 6 (six) hours as needed for wheezing or shortness of breath.   prn at prn  . amLODipine (NORVASC) 2.5 MG tablet  Take 1 tablet by mouth daily.     Marland Kitchen amLODipine (NORVASC) 5 MG tablet Take 1 tablet (5 mg total) by mouth daily. 30 tablet 0 01/31/2017 at 0800  . aspirin EC 81 MG tablet Take 81 mg by mouth daily.   01/31/2017 at 0800  . azithromycin (ZITHROMAX) 250 MG tablet 250mg  once daily for 4 more days 4 each 0   . cyanocobalamin 500 MCG tablet Take 500 mcg by mouth 2 (two) times daily.   01/31/2017 at 1800  . docusate sodium (COLACE) 100 MG capsule Take 1 capsule (100 mg total) by mouth 2 (two) times daily as needed for mild constipation. 10 capsule 0 01/31/2017 at 1800  . Fluticasone-Salmeterol (ADVAIR) 250-50 MCG/DOSE AEPB Inhale 1 puff into the lungs 2 (two) times daily.   01/31/2017 at 1800  . HYDROcodone-acetaminophen (NORCO/VICODIN) 5-325 MG tablet Take 1-2 tablets by mouth every 4 (four) hours as needed for moderate pain. (Patient not taking: Reported on 01/27/2017) 30 tablet 0 prn at prn  . levothyroxine (SYNTHROID, LEVOTHROID) 25 MCG tablet Take 1 tablet (25 mcg total) by mouth daily before breakfast. 30 tablet 0 01/31/2017 at 0800  . Multiple Vitamins-Minerals (MULTIVITAMIN WITH MINERALS) tablet Take 1 tablet by mouth daily.   01/31/2017 at 0800  . rosuvastatin (CRESTOR) 5 MG tablet Take 1 tablet (5 mg total) by mouth daily at 6 PM. 30 tablet  01/31/2017 at 1800  . tamsulosin (FLOMAX) 0.4 MG CAPS capsule Take 1 capsule (0.4 mg  total) by mouth daily. 30 capsule 0 01/31/2017 at 0800  . warfarin (COUMADIN) 3 MG tablet Take 3 mg by mouth daily. Take as directed by anticoagulation clinic   01/31/2017 at 0800   Assessment: Pt received levaquin 750 mg IV X 1 on 12/7 @  19:34.  CrCl = 14.2 ml/min   Goal of Therapy:  resolution of infection  Plan:  Expected duration 7 days with resolution of temperature and/or normalization of WBC   Ceftriaxone 1 gm IV Q24H ordered to start on 12/7 @ 22:00 Azithromycin 500 mg IV Q24H ordered to start on 12/8 @ 18:00 as pt received levaquin on 12/7 @ 19:34.   Kyle Vasquez  D 03/25/2017,10:01 PM

## 2017-03-03 NOTE — Progress Notes (Signed)
ANTICOAGULATION CONSULT NOTE - Initial Consult  Pharmacy Consult for [facility] Indication: elevated INR   No Known Allergies  Patient Measurements: Height: 6\' 5"  (195.6 cm) Weight: 159 lb (72.1 kg) IBW/kg (Calculated) : 89.1 Heparin Dosing Weight:   Vital Signs: Temp: 90.4 F (32.4 C) (12/07 2025) BP: 56/46 (12/07 2025) Pulse Rate: 60 (12/07 2025)  Labs: Recent Labs    03/07/2017 1827  HGB 6.7*  HCT 21.2*  PLT 77*  APTT 80*  LABPROT 61.0*  INR 7.16*  CREATININE 4.01*  TROPONINI 0.03*    Estimated Creatinine Clearance: 14.2 mL/min (A) (by C-G formula based on SCr of 4.01 mg/dL (H)).   Medical History: Past Medical History:  Diagnosis Date  . Anemia   . Atrial fibrillation ([place])   . Bradycardia   . Chronic kidney disease   . COPD (chronic obstructive pulmonary disease) ([place])   . Coronary artery disease   . Depression   . Heart murmur   . HLD (hyperlipidemia)   . Hypertension   . Hypothyroidism   . Lumbar myelopathy ([place])   . MDS (myelodysplastic syndrome) ([place])   . OSA (obstructive sleep apnea)   . Stroke [facility])    left sided weakness  . Syncope     Medications:  Medications Prior to Admission  Medication Sig Dispense Refill Last Dose  . acetaminophen (TYLENOL) 325 MG tablet Take 1 tablet (325 mg total) by mouth every 6 (six) hours as needed for mild pain.   prn at prn  . albuterol (PROVENTIL HFA;VENTOLIN HFA) 108 (90 Base) MCG/ACT inhaler Inhale 2 puffs into the lungs every 6 (six) hours as needed for wheezing or shortness of breath.   prn at prn  . amLODipine (NORVASC) 2.5 MG tablet Take 1 tablet by mouth daily.     Marland Kitchen amLODipine (NORVASC) 5 MG tablet Take 1 tablet (5 mg total) by mouth daily. 30 tablet 0 01/31/2017 at 0800  . aspirin EC 81 MG tablet Take 81 mg by mouth daily.   01/31/2017 at 0800  . azithromycin (ZITHROMAX) 250 MG tablet 250mg  once daily for 4 more days 4 each 0   . cyanocobalamin 500 MCG tablet Take 500 mcg by mouth 2 (two) times daily.    01/31/2017 at 1800  . docusate sodium (COLACE) 100 MG capsule Take 1 capsule (100 mg total) by mouth 2 (two) times daily as needed for mild constipation. 10 capsule 0 01/31/2017 at 1800  . Fluticasone-Salmeterol (ADVAIR) 250-50 MCG/DOSE AEPB Inhale 1 puff into the lungs 2 (two) times daily.   01/31/2017 at 1800  . HYDROcodone-acetaminophen (NORCO/VICODIN) 5-325 MG tablet Take 1-2 tablets by mouth every 4 (four) hours as needed for moderate pain. (Patient not taking: Reported on 01/27/2017) 30 tablet 0 prn at prn  . levothyroxine (SYNTHROID, LEVOTHROID) 25 MCG tablet Take 1 tablet (25 mcg total) by mouth daily before breakfast. 30 tablet 0 01/31/2017 at 0800  . Multiple Vitamins-Minerals (MULTIVITAMIN WITH MINERALS) tablet Take 1 tablet by mouth daily.   01/31/2017 at 0800  . rosuvastatin (CRESTOR) 5 MG tablet Take 1 tablet (5 mg total) by mouth daily at 6 PM. 30 tablet  01/31/2017 at 1800  . tamsulosin (FLOMAX) 0.4 MG CAPS capsule Take 1 capsule (0.4 mg total) by mouth daily. 30 capsule 0 01/31/2017 at 0800  . warfarin (COUMADIN) 3 MG tablet Take 3 mg by mouth daily. Take as directed by anticoagulation clinic   01/31/2017 at 0800    Assessment: Pharmacy consulted to dose/monitor KCentra in this 83  year old male s/p cardiac arrest .  Pt had cardiac arrest secondary to hypotension due to GI bleed.   Pt was on warfarin at home ;  12/07 INR = 7.16  Goal of Therapy:  INR < 2    Plan:  Will order INR 1 hr post KCentra administration and then check INR Q6H X 4 and then check daily X 2 .   Gwynneth Fabio D 03/20/2017,9:21 PM

## 2017-03-03 NOTE — Consult Note (Signed)
PULMONARY / CRITICAL CARE MEDICINE   Name: Kyle Vasquez MRN: [id number] DOB: 09/02/1931    ADMISSION DATE:  03/02/2017   CONSULTATION DATE:  03/24/2017  REFERRING MD:  Dr. Darvin Neighbours  REASON: Cardiac arrest  HISTORY OF PRESENT ILLNESS:   This is an 83 y/o male with a PMH as indicated below who presented to the ED via EMS following a cardiac arrest event at home. History is obtained from ED and EMS records as patient is currently intubated and sedated. Per ED records, patient was found on the floor by family. He had agonal respirations and proceeded to have a witnessed arrest. CPR was initiated and sustained for 6 minutes. Patient received 2 doses of epinephrine and a king airway was placed. Upon arrival in the ED, airway was replaced with an ETT. No shocks administered. His blood presure  And HR were stable upon arrival. His ED work-up revealed a Hg/HCT of 6.7/21.2, platelets of 77, INR of 7.16, PT 61, aPTT 80, Creatinine of 4.0 and a potassium of 6.2. He is being admitted to the ICU for further management  PAST MEDICAL HISTORY :  He  has a past medical history of Anemia, Atrial fibrillation ([place]), Bradycardia, Chronic kidney disease, COPD (chronic obstructive pulmonary disease) ([place]), Coronary artery disease, Depression, Heart murmur, HLD (hyperlipidemia), Hypertension, Hypothyroidism, Lumbar myelopathy ([place]), MDS (myelodysplastic syndrome) ([place]), OSA (obstructive sleep apnea), Stroke ([place]), and Syncope.  PAST SURGICAL HISTORY: He  has a past surgical history that includes Cardiac surgery; Back surgery; Neck surgery; Abdominal aortic aneurysm repair; Cardiac valve replacement; and Pacemaker insertion (Left, 07/20/2016).  No Known Allergies  No current facility-administered medications on file prior to encounter.    Current Outpatient Medications on File Prior to Encounter  Medication Sig  . acetaminophen (TYLENOL) 325 MG tablet Take 1 tablet (325 mg total) by mouth every 6 (six) hours as  needed for mild pain.  Marland Kitchen albuterol (PROVENTIL HFA;VENTOLIN HFA) 108 (90 Base) MCG/ACT inhaler Inhale 2 puffs into the lungs every 6 (six) hours as needed for wheezing or shortness of breath.  Marland Kitchen amLODipine (NORVASC) 2.5 MG tablet Take 1 tablet by mouth daily.  Marland Kitchen amLODipine (NORVASC) 5 MG tablet Take 1 tablet (5 mg total) by mouth daily.  Marland Kitchen aspirin EC 81 MG tablet Take 81 mg by mouth daily.  Marland Kitchen azithromycin (ZITHROMAX) 250 MG tablet 250mg  once daily for 4 more days  . cyanocobalamin 500 MCG tablet Take 500 mcg by mouth 2 (two) times daily.  Marland Kitchen docusate sodium (COLACE) 100 MG capsule Take 1 capsule (100 mg total) by mouth 2 (two) times daily as needed for mild constipation.  . Fluticasone-Salmeterol (ADVAIR) 250-50 MCG/DOSE AEPB Inhale 1 puff into the lungs 2 (two) times daily.  Marland Kitchen HYDROcodone-acetaminophen (NORCO/VICODIN) 5-325 MG tablet Take 1-2 tablets by mouth every 4 (four) hours as needed for moderate pain. (Patient not taking: Reported on 01/27/2017)  . levothyroxine (SYNTHROID, LEVOTHROID) 25 MCG tablet Take 1 tablet (25 mcg total) by mouth daily before breakfast.  . Multiple Vitamins-Minerals (MULTIVITAMIN WITH MINERALS) tablet Take 1 tablet by mouth daily.  . rosuvastatin (CRESTOR) 5 MG tablet Take 1 tablet (5 mg total) by mouth daily at 6 PM.  . tamsulosin (FLOMAX) 0.4 MG CAPS capsule Take 1 capsule (0.4 mg total) by mouth daily.  Marland Kitchen warfarin (COUMADIN) 3 MG tablet Take 3 mg by mouth daily. Take as directed by anticoagulation clinic    FAMILY HISTORY:  His indicated that the status of his mother is unknown. He indicated that  the status of his father is unknown.   SOCIAL HISTORY: He  reports that he has quit smoking. he has never used smokeless tobacco. He reports that he does not drink alcohol or use drugs.  REVIEW OF SYSTEMS:   Unable to obtain as patient is intubated and sedated  SUBJECTIVE:   VITAL SIGNS: BP (!) 89/62   Pulse (!) 59   Temp (!) 87.2 F (30.7 C)   Resp 20   Ht  6\' 5"  (1.956 m)   Wt 72.1 kg (159 lb)   SpO2 96%   BMI 18.85 kg/m   HEMODYNAMICS:    VENTILATOR SETTINGS: Vent Mode: AC FiO2 (%):  [40 %-50 %] 40 % Set Rate:  [15 bmp] 15 bmp Vt Set:  [600 mL] 600 mL PEEP:  [5 cmH20] 5 cmH20  INTAKE / OUTPUT: No intake/output data recorded.  PHYSICAL EXAMINATION: General:  Sedated, NAD Neuro: Biting ETT, withdraws to pain HEENT: PERRLA, edentulous, neck is supple without JVD Cardiovascular: Irregular, S1/S2, no MRG, +2 pulses, no edema Lungs: Bilateral breath sounds, no wheezing Abdomen: non-distended, normal BS X4 Musculoskeletal:  +ROM, no deformities Skin:  Cold and clammy, pale  LABS:  BMET Recent Labs  Lab 03/11/2017 1827  NA 137  K 6.2*  CL 115*  CO2 16*  BUN 73*  CREATININE 4.01*  GLUCOSE 134*    Electrolytes Recent Labs  Lab 03/16/2017 1827  CALCIUM 8.9    CBC Recent Labs  Lab 03/20/2017 1827  WBC 2.2*  HGB 6.7*  HCT 21.2*  PLT 77*    Coag's Recent Labs  Lab 03/02/2017 1827  APTT 80*  INR 7.16*    Sepsis Markers No results for input(s): LATICACIDVEN, PROCALCITON, O2SATVEN in the last 168 hours.  ABG Recent Labs  Lab 03/26/2017 1834  PHART 7.20*  PCO2ART 37  PO2ART 144*    Liver Enzymes Recent Labs  Lab 03/04/2017 1827  AST 82*  ALT 52  ALKPHOS 73  BILITOT 0.5  ALBUMIN 2.6*    Cardiac Enzymes Recent Labs  Lab 03/02/2017 1827  TROPONINI 0.03*    Glucose No results for input(s): GLUCAP in the last 168 hours.  Imaging Dg Abdomen 1 View  Result Date: 03/21/2017 CLINICAL DATA:  Encounter for orogastric tube placement. EXAM: ABDOMEN - 1 VIEW COMPARISON:  None. FINDINGS: The tip and side port of a gastric tube are seen in the expected location of the stomach. The included heart is enlarged. Pleural thickening and/or small loculated fluid is noted along the periphery the right hemithorax. Partially visualized distal aortic stent graft. Lumbar fusion hardware noted at L2-3. No radio-opaque  calculi or other significant radiographic abnormality are seen. IMPRESSION: 1. Gastric tube in the expected location of the stomach. 2. Cardiomegaly with pleural thickening versus small loculated pleural fluid. Electronically Signed   By: Ashley Royalty M.D.   On: 02/25/2017 19:04   Dg Chest Portable 1 View  Result Date: 03/10/2017 CLINICAL DATA:  Patient found on floor by family in with agonal breathing. EXAM: PORTABLE CHEST 1 VIEW COMPARISON:  02/01/2017 FINDINGS: Cardiomegaly with aortic atherosclerosis. Endotracheal tube is noted 4.3 cm above the carina in satisfactory position. Pulmonary vascular redistribution is seen consistent with pulmonary edema. Superimposed airspace opacities in the periphery of the right upper and central left upper lobes cannot concomitant pneumonia. No left effusion. The right lateral costophrenic angle is included as part of the abdomen radiographs acquired sequentially with this exam. There does appear to be pleural thickening or a small fluid  loculation along the periphery of the right hemithorax. Spinal fusion hardware noted of the included lower cervical spine. External defibrillator paddles project over the cardiac silhouette. No acute osseous abnormality. IMPRESSION: 1. Cardiomegaly with aortic atherosclerosis. Pleural thickening versus small amount of loculated fluid along the periphery of the visualized right hemithorax. 2. Satisfactory endotracheal tube position. 3. Diffuse pulmonary vascular redistribution consistent with pulmonary edema. 4. Superimposed pneumonia in the upper lobes is not excluded. Electronically Signed   By: Ashley Royalty M.D.   On: 03/02/2017 18:53   STUDIES:  2-D echo pending  CULTURES: Blood cultures x 2 Urine culture Respiratory   ANTIBIOTICS: Ceftriaxone Azithromycin SIGNIFICANT EVENTS: 12/07>a=cardiac arrest>admitted  LINES/TUBES: 12/07 PIVs ETT Foley  DISCUSSION: 83 Y/O male presenting with cardiac arrest, cummunity acquired  pneumonia, cardiogenic/hypovolemic shock, acute blood loss anemia, coumadin induced coagulopathy, acute on chronic renal failure, thrombocytopenia, neutropenia and hyperkalemia  ASSESSMENT / PLAN:  PULMONARY A: Acute respiratory failure secondary to cardiac arrest ARDS  Acute pulmonary edema Community acquired pneumonia Severe metabolic acidosis P:   Full vent support with current settings VAP protocol Nebulized bronchodilators ABG and CXR as needed Abx for CAP as above Influenza vaccine prior to discharge if not taken yet Bicarb infusion and vent changes CARDIOVASCULAR A:  Cardiac arrest Cardiogenic/hypovolemic shock H/O Afib P:  Hemodynamic monitoring per ICU protocol CVP Q4H IV fluids HR control with prn metoprolol Pressors to maintain mean arterial blood pressure less than 65 Transfuse per protocol RENAL A:   Acute on chronic renal failure Hyperkalemia P:   IV fluids Monitor and correct electrolytes Nephrology consult Patient may require CRRT  GASTROINTESTINAL A:   Acute GI bleed P:   GI consulted; we will probably scope patient today Protonix infusion Blood products as needed  HEMATOLOGIC A:   Acute blood loss anemia Coumadin induced coagulaopathy P:  Kcentra DC Coumadin Trend PT/INR Transfuse blood products as needed  INFECTIOUS A:   CAP Sepsis-urinary versus pulmonary source P:   Antibiotics as above Follow-up cultures  ENDOCRINE A:   H/o Hypothyroidism   P:   Resume home medications Monitor blood glucose and treat hypoglycemia  NEUROLOGIC A:   Acute metabolic encephalopathy 2/2 sepsis and cardiac arrest P:   RASS goal: 0 to -1 Monitor neurological status Fentanyl and as needed Versed for vent sedation and discomfort  FAMILY  - Updates: Patient's son and daughter updated at bedside.  All questions answered  - Inter-disciplinary family meet or Palliative Care meeting due by:  day [place]. [facility] ANP-BC Pulmonary  and Critical Care Medicine [facility] Pager [phone] or [phone] 03/12/2017, 8:01 PM

## 2017-03-03 NOTE — ED Notes (Signed)
Rapid infuser set up with more fluid going in

## 2017-03-03 NOTE — ED Notes (Signed)
IO put in by EMS in left tibia

## 2017-03-04 ENCOUNTER — Inpatient Hospital Stay: Payer: Medicare HMO

## 2017-03-04 ENCOUNTER — Inpatient Hospital Stay: Admit: 2017-03-04 | Payer: Medicare HMO

## 2017-03-04 DIAGNOSIS — D62 Acute posthemorrhagic anemia: Secondary | ICD-10-CM

## 2017-03-04 DIAGNOSIS — R570 Cardiogenic shock: Secondary | ICD-10-CM

## 2017-03-04 DIAGNOSIS — K922 Gastrointestinal hemorrhage, unspecified: Secondary | ICD-10-CM

## 2017-03-04 DIAGNOSIS — J9600 Acute respiratory failure, unspecified whether with hypoxia or hypercapnia: Secondary | ICD-10-CM

## 2017-03-04 DIAGNOSIS — J9601 Acute respiratory failure with hypoxia: Secondary | ICD-10-CM

## 2017-03-04 DIAGNOSIS — K920 Hematemesis: Secondary | ICD-10-CM

## 2017-03-04 DIAGNOSIS — E875 Hyperkalemia: Secondary | ICD-10-CM

## 2017-03-04 LAB — BLOOD GAS, ARTERIAL
ACID-BASE DEFICIT: 10.6 mmol/L — AB (ref 0.0–2.0)
Acid-base deficit: 12.1 mmol/L — ABNORMAL HIGH (ref 0.0–2.0)
BICARBONATE: 16.3 mmol/L — AB (ref 20.0–28.0)
BICARBONATE: 16.8 mmol/L — AB (ref 20.0–28.0)
FIO2: 0.4
FIO2: 1
MECHANICAL RATE: 15
MECHVT: 600 mL
MECHVT: 600 mL
Mechanical Rate: 20
O2 SAT: 99.6 %
O2 Saturation: 99.7 %
PATIENT TEMPERATURE: 37
PCO2 ART: 43 mmHg (ref 32.0–48.0)
PEEP: 5 cmH2O
PEEP: 5 cmH2O
PH ART: 7.2 — AB (ref 7.350–7.450)
PO2 ART: 237 mmHg — AB (ref 83.0–108.0)
Patient temperature: 37
pCO2 arterial: 49 mmHg — ABNORMAL HIGH (ref 32.0–48.0)
pH, Arterial: 7.13 — CL (ref 7.350–7.450)
pO2, Arterial: 218 mmHg — ABNORMAL HIGH (ref 83.0–108.0)

## 2017-03-04 LAB — RENAL FUNCTION PANEL
ALBUMIN: 2.2 g/dL — AB (ref 3.5–5.0)
ALBUMIN: 2.1 g/dL — AB (ref 3.5–5.0)
ANION GAP: 8 (ref 5–15)
Albumin: 2.2 g/dL — ABNORMAL LOW (ref 3.5–5.0)
Anion gap: 6 (ref 5–15)
Anion gap: 7 (ref 5–15)
BUN: 63 mg/dL — AB (ref 6–20)
BUN: 52 mg/dL — AB (ref 6–20)
BUN: 43 mg/dL — ABNORMAL HIGH (ref 6–20)
CALCIUM: 7.4 mg/dL — AB (ref 8.9–10.3)
CALCIUM: 7.2 mg/dL — AB (ref 8.9–10.3)
CHLORIDE: 116 mmol/L — AB (ref 101–111)
CHLORIDE: 107 mmol/L (ref 101–111)
CO2: 17 mmol/L — AB (ref 22–32)
CO2: 21 mmol/L — ABNORMAL LOW (ref 22–32)
CO2: 22 mmol/L (ref 22–32)
CREATININE: 3.33 mg/dL — AB (ref 0.61–1.24)
CREATININE: 2.82 mg/dL — AB (ref 0.61–1.24)
Calcium: 7.3 mg/dL — ABNORMAL LOW (ref 8.9–10.3)
Chloride: 111 mmol/L (ref 101–111)
Creatinine, Ser: 2.38 mg/dL — ABNORMAL HIGH (ref 0.61–1.24)
GFR calc Af Amer: 18 mL/min — ABNORMAL LOW (ref >60)
GFR calc Af Amer: 22 mL/min — ABNORMAL LOW (ref >60)
GFR calc Af Amer: 27 mL/min — ABNORMAL LOW (ref >60)
GFR calc non Af Amer: 16 mL/min — ABNORMAL LOW (ref >60)
GFR calc non Af Amer: 24 mL/min — ABNORMAL LOW (ref >60)
GFR, EST NON AFRICAN AMERICAN: 19 mL/min — AB (ref >60)
GLUCOSE: 77 mg/dL (ref 65–99)
GLUCOSE: 72 mg/dL (ref 65–99)
Glucose, Bld: 61 mg/dL — ABNORMAL LOW (ref 65–99)
PHOSPHORUS: 3.3 mg/dL (ref 2.5–4.6)
PHOSPHORUS: 2.9 mg/dL (ref 2.5–4.6)
POTASSIUM: 4.6 mmol/L (ref 3.5–5.1)
Phosphorus: 3.7 mg/dL (ref 2.5–4.6)
Potassium: 5.4 mmol/L — ABNORMAL HIGH (ref 3.5–5.1)
Potassium: 5.1 mmol/L (ref 3.5–5.1)
SODIUM: 139 mmol/L (ref 135–145)
SODIUM: 139 mmol/L (ref 135–145)
Sodium: 137 mmol/L (ref 135–145)

## 2017-03-04 LAB — COMPREHENSIVE METABOLIC PANEL
ALK PHOS: 61 U/L (ref 38–126)
ALT: 51 U/L (ref 17–63)
ALT: 60 U/L (ref 17–63)
ANION GAP: 5 (ref 5–15)
AST: 91 U/L — AB (ref 15–41)
AST: 110 U/L — ABNORMAL HIGH (ref 15–41)
Albumin: 2.2 g/dL — ABNORMAL LOW (ref 3.5–5.0)
Albumin: 2.3 g/dL — ABNORMAL LOW (ref 3.5–5.0)
Alkaline Phosphatase: 67 U/L (ref 38–126)
Anion gap: 6 (ref 5–15)
BUN: 68 mg/dL — AB (ref 6–20)
BUN: 65 mg/dL — ABNORMAL HIGH (ref 6–20)
CALCIUM: 7.9 mg/dL — AB (ref 8.9–10.3)
CHLORIDE: 118 mmol/L — AB (ref 101–111)
CHLORIDE: 118 mmol/L — AB (ref 101–111)
CO2: 14 mmol/L — ABNORMAL LOW (ref 22–32)
CO2: 17 mmol/L — ABNORMAL LOW (ref 22–32)
CREATININE: 3.65 mg/dL — AB (ref 0.61–1.24)
Calcium: 7.5 mg/dL — ABNORMAL LOW (ref 8.9–10.3)
Creatinine, Ser: 3.69 mg/dL — ABNORMAL HIGH (ref 0.61–1.24)
GFR calc Af Amer: 16 mL/min — ABNORMAL LOW (ref >60)
GFR calc non Af Amer: 14 mL/min — ABNORMAL LOW (ref >60)
GFR, EST AFRICAN AMERICAN: 16 mL/min — AB (ref >60)
GFR, EST NON AFRICAN AMERICAN: 14 mL/min — AB (ref >60)
Glucose, Bld: 46 mg/dL — ABNORMAL LOW (ref 65–99)
Glucose, Bld: 41 mg/dL — CL (ref 65–99)
Potassium: 5.8 mmol/L — ABNORMAL HIGH (ref 3.5–5.1)
Potassium: 5.6 mmol/L — ABNORMAL HIGH (ref 3.5–5.1)
SODIUM: 140 mmol/L (ref 135–145)
Sodium: 138 mmol/L (ref 135–145)
Total Bilirubin: 0.7 mg/dL (ref 0.3–1.2)
Total Bilirubin: 0.9 mg/dL (ref 0.3–1.2)
Total Protein: 5.1 g/dL — ABNORMAL LOW (ref 6.5–8.1)
Total Protein: 5.6 g/dL — ABNORMAL LOW (ref 6.5–8.1)

## 2017-03-04 LAB — CBC
HCT: 20.9 % — ABNORMAL LOW (ref 40.0–52.0)
HCT: 20.4 % — ABNORMAL LOW (ref 40.0–52.0)
HEMATOCRIT: 27.5 % — AB (ref 40.0–52.0)
HEMOGLOBIN: 6.9 g/dL — AB (ref 13.0–18.0)
HEMOGLOBIN: 9.2 g/dL — AB (ref 13.0–18.0)
Hemoglobin: 6.6 g/dL — ABNORMAL LOW (ref 13.0–18.0)
MCH: 31.1 pg (ref 26.0–34.0)
MCH: 31.3 pg (ref 26.0–34.0)
MCH: 30.8 pg (ref 26.0–34.0)
MCHC: 33.1 g/dL (ref 32.0–36.0)
MCHC: 32.4 g/dL (ref 32.0–36.0)
MCHC: 33.6 g/dL (ref 32.0–36.0)
MCV: 94 fL (ref 80.0–100.0)
MCV: 96.7 fL (ref 80.0–100.0)
MCV: 91.8 fL (ref 80.0–100.0)
PLATELETS: 74 10*3/uL — AB (ref 150–440)
PLATELETS: 70 10*3/uL — AB (ref 150–440)
Platelets: 110 10*3/uL — ABNORMAL LOW (ref 150–440)
RBC: 2.23 MIL/uL — AB (ref 4.40–5.90)
RBC: 2.11 MIL/uL — ABNORMAL LOW (ref 4.40–5.90)
RBC: 3 MIL/uL — ABNORMAL LOW (ref 4.40–5.90)
RDW: 16.8 % — ABNORMAL HIGH (ref 11.5–14.5)
RDW: 17.6 % — AB (ref 11.5–14.5)
RDW: 17.1 % — ABNORMAL HIGH (ref 11.5–14.5)
WBC: 1.9 10*3/uL — ABNORMAL LOW (ref 3.8–10.6)
WBC: 1.7 10*3/uL — AB (ref 3.8–10.6)
WBC: 2.2 10*3/uL — ABNORMAL LOW (ref 3.8–10.6)

## 2017-03-04 LAB — PROTIME-INR
INR: 2.37
INR: 1.93
INR: 1.86
INR: 1.74
PROTHROMBIN TIME: 21.9 s — AB (ref 11.4–15.2)
Prothrombin Time: 25.7 seconds — ABNORMAL HIGH (ref 11.4–15.2)
Prothrombin Time: 21.3 seconds — ABNORMAL HIGH (ref 11.4–15.2)
Prothrombin Time: 20.2 seconds — ABNORMAL HIGH (ref 11.4–15.2)

## 2017-03-04 LAB — GLUCOSE, CAPILLARY
GLUCOSE-CAPILLARY: 53 mg/dL — AB (ref 65–99)
GLUCOSE-CAPILLARY: 62 mg/dL — AB (ref 65–99)
Glucose-Capillary: 118 mg/dL — ABNORMAL HIGH (ref 65–99)
Glucose-Capillary: 76 mg/dL (ref 65–99)
Glucose-Capillary: 73 mg/dL (ref 65–99)
Glucose-Capillary: 62 mg/dL — ABNORMAL LOW (ref 65–99)
Glucose-Capillary: 104 mg/dL — ABNORMAL HIGH (ref 65–99)

## 2017-03-04 LAB — PHOSPHORUS: PHOSPHORUS: 4.5 mg/dL (ref 2.5–4.6)

## 2017-03-04 LAB — APTT: APTT: 48 s — AB (ref 24–36)

## 2017-03-04 LAB — LACTIC ACID, PLASMA
LACTIC ACID, VENOUS: 1.9 mmol/L (ref 0.5–1.9)
Lactic Acid, Venous: 2.9 mmol/L (ref 0.5–1.9)

## 2017-03-04 LAB — PREPARE RBC (CROSSMATCH)

## 2017-03-04 LAB — MAGNESIUM: Magnesium: 2.1 mg/dL (ref 1.7–2.4)

## 2017-03-04 LAB — TROPONIN I: TROPONIN I: 0.38 ng/mL — AB (ref <0.03)

## 2017-03-04 LAB — PROCALCITONIN: Procalcitonin: 0.45 ng/mL

## 2017-03-04 MED ORDER — DEXTROSE 50 % IV SOLN
50.0000 mL | Freq: Once | INTRAVENOUS | Status: AC
  Administered 2017-03-04: 50 mL via INTRAVENOUS

## 2017-03-04 MED ORDER — SODIUM CHLORIDE 0.9 % IV BOLUS (SEPSIS)
1000.0000 mL | INTRAVENOUS | Status: AC
  Administered 2017-03-04 (×2): 1000 mL via INTRAVENOUS

## 2017-03-04 MED ORDER — VANCOMYCIN HCL IN DEXTROSE 1-5 GM/200ML-% IV SOLN
1000.0000 mg | INTRAVENOUS | Status: DC
  Administered 2017-03-04: 1000 mg via INTRAVENOUS
  Filled 2017-03-04: qty 200

## 2017-03-04 MED ORDER — NOREPINEPHRINE BITARTRATE 1 MG/ML IV SOLN
0.0000 ug/min | INTRAVENOUS | Status: DC
  Administered 2017-03-04: 20 ug/min via INTRAVENOUS
  Administered 2017-03-04: 35 ug/min via INTRAVENOUS
  Administered 2017-03-04: 30 ug/min via INTRAVENOUS
  Administered 2017-03-05: 20 ug/min via INTRAVENOUS
  Administered 2017-03-05: 50 ug/min via INTRAVENOUS
  Administered 2017-03-06: 16 ug/min via INTRAVENOUS
  Administered 2017-03-06: 8 ug/min via INTRAVENOUS
  Administered 2017-03-06: 18 ug/min via INTRAVENOUS
  Administered 2017-03-06: 25 ug/min via INTRAVENOUS
  Administered 2017-03-06: 23 ug/min via INTRAVENOUS
  Administered 2017-03-06: 12 ug/min via INTRAVENOUS
  Administered 2017-03-06: 18 ug/min via INTRAVENOUS
  Administered 2017-03-06: 25 ug/min via INTRAVENOUS
  Filled 2017-03-04 (×6): qty 16

## 2017-03-04 MED ORDER — PIPERACILLIN-TAZOBACTAM 3.375 G IVPB: 3.3750 g | Freq: Two times a day (BID) | INTRAVENOUS | Status: DC

## 2017-03-04 MED ORDER — DOPAMINE-DEXTROSE 3.2-5 MG/ML-% IV SOLN
INTRAVENOUS | Status: AC
  Administered 2017-03-04: 20 ug/kg/min
  Filled 2017-03-04: qty 250

## 2017-03-04 MED ORDER — PIPERACILLIN-TAZOBACTAM 3.375 G IVPB
3.3750 g | Freq: Three times a day (TID) | INTRAVENOUS | Status: DC
  Administered 2017-03-04 – 2017-03-06 (×6): 3.375 g via INTRAVENOUS
  Filled 2017-03-04 (×6): qty 50

## 2017-03-04 MED ORDER — PHENYLEPHRINE HCL 10 MG/ML IJ SOLN
0.0000 ug/min | INTRAMUSCULAR | Status: DC
  Administered 2017-03-04: 250 ug/min via INTRAVENOUS
  Administered 2017-03-04: 200 ug/min via INTRAVENOUS
  Administered 2017-03-04: 300 ug/min via INTRAVENOUS
  Administered 2017-03-04: 400 ug/min via INTRAVENOUS
  Administered 2017-03-04: 250 ug/min via INTRAVENOUS
  Administered 2017-03-04: 400 ug/min via INTRAVENOUS
  Administered 2017-03-04: 65 ug/min via INTRAVENOUS
  Administered 2017-03-05: 160 ug/min via INTRAVENOUS
  Administered 2017-03-05: 260 ug/min via INTRAVENOUS
  Administered 2017-03-05: 290 ug/min via INTRAVENOUS
  Administered 2017-03-05: 280 ug/min via INTRAVENOUS
  Administered 2017-03-05 (×2): 290.667 ug/min via INTRAVENOUS
  Administered 2017-03-05: 290 ug/min via INTRAVENOUS
  Administered 2017-03-05: 290.667 ug/min via INTRAVENOUS
  Administered 2017-03-06: 90 ug/min via INTRAVENOUS
  Filled 2017-03-04: qty 4
  Filled 2017-03-04: qty 40
  Filled 2017-03-04 (×2): qty 4
  Filled 2017-03-04: qty 40
  Filled 2017-03-04: qty 4
  Filled 2017-03-04 (×5): qty 40
  Filled 2017-03-04: qty 4
  Filled 2017-03-04 (×2): qty 40
  Filled 2017-03-04: qty 4
  Filled 2017-03-04 (×2): qty 40
  Filled 2017-03-04: qty 4
  Filled 2017-03-04 (×2): qty 40
  Filled 2017-03-04 (×2): qty 4

## 2017-03-04 MED ORDER — CHLORHEXIDINE GLUCONATE 0.12% ORAL RINSE (MEDLINE KIT)
15.0000 mL | Freq: Two times a day (BID) | OROMUCOSAL | Status: DC
  Administered 2017-03-04 – 2017-03-08 (×9): 15 mL via OROMUCOSAL

## 2017-03-04 MED ORDER — VANCOMYCIN HCL IN DEXTROSE 1-5 GM/200ML-% IV SOLN
1000.0000 mg | Freq: Once | INTRAVENOUS | Status: AC
  Administered 2017-03-04: 1000 mg via INTRAVENOUS
  Filled 2017-03-04: qty 200

## 2017-03-04 MED ORDER — HEPARIN SODIUM (PORCINE) 1000 UNIT/ML DIALYSIS
1000.0000 [IU] | INTRAMUSCULAR | Status: DC | PRN
  Filled 2017-03-04: qty 6

## 2017-03-04 MED ORDER — DEXTROSE 10 % IV SOLN
INTRAVENOUS | Status: DC
  Administered 2017-03-04 – 2017-03-05 (×4): via INTRAVENOUS
  Administered 2017-03-06: 125 mL/h via INTRAVENOUS
  Administered 2017-03-06 – 2017-03-08 (×5): via INTRAVENOUS

## 2017-03-04 MED ORDER — INSULIN REGULAR HUMAN 100 UNIT/ML IJ SOLN
10.0000 [IU] | Freq: Once | INTRAMUSCULAR | Status: AC
  Administered 2017-03-04: 10 [IU] via INTRAVENOUS
  Filled 2017-03-04: qty 0.1

## 2017-03-04 MED ORDER — DEXTROSE 50 % IV SOLN
INTRAVENOUS | Status: AC
  Administered 2017-03-04: 50 mL via INTRAVENOUS
  Filled 2017-03-04: qty 50

## 2017-03-04 MED ORDER — SODIUM BICARBONATE 8.4 % IV SOLN
INTRAVENOUS | Status: DC
  Administered 2017-03-04 – 2017-03-05 (×3): via INTRAVENOUS
  Filled 2017-03-04 (×7): qty 150

## 2017-03-04 MED ORDER — SODIUM CHLORIDE 0.9 % IV SOLN
Freq: Once | INTRAVENOUS | Status: AC
  Administered 2017-03-04: 10 mL/h via INTRAVENOUS

## 2017-03-04 MED ORDER — ORAL CARE MOUTH RINSE
15.0000 mL | OROMUCOSAL | Status: DC
  Administered 2017-03-04 – 2017-03-08 (×41): 15 mL via OROMUCOSAL

## 2017-03-04 MED ORDER — DEXTROSE 50 % IV SOLN
INTRAVENOUS | Status: AC
  Filled 2017-03-04: qty 50

## 2017-03-04 MED ORDER — PUREFLOW DIALYSIS SOLUTION
INTRAVENOUS | Status: DC
  Administered 2017-03-04 (×2): via INTRAVENOUS_CENTRAL
  Administered 2017-03-04 – 2017-03-05 (×4): 3 via INTRAVENOUS_CENTRAL
  Administered 2017-03-06: 1 mL via INTRAVENOUS_CENTRAL
  Administered 2017-03-06 – 2017-03-07 (×2): 3 via INTRAVENOUS_CENTRAL
  Administered 2017-03-08: 10:00:00 via INTRAVENOUS_CENTRAL
  Administered 2017-03-08: 3 via INTRAVENOUS_CENTRAL

## 2017-03-04 MED ORDER — PHENYLEPHRINE HCL 10 MG/ML IJ SOLN: 0.0000 ug/min | INTRAVENOUS | Status: DC

## 2017-03-04 MED ORDER — FUROSEMIDE 10 MG/ML IJ SOLN
20.0000 mg | Freq: Once | INTRAMUSCULAR | Status: AC
  Administered 2017-03-04: 20 mg via INTRAVENOUS
  Filled 2017-03-04: qty 2

## 2017-03-04 MED ORDER — DEXTROSE 50 % IV SOLN
1.0000 | Freq: Once | INTRAVENOUS | Status: AC
  Administered 2017-03-04 – 2017-03-05 (×2): 50 mL via INTRAVENOUS
  Filled 2017-03-04: qty 50

## 2017-03-04 MED ORDER — VASOPRESSIN 20 UNIT/ML IV SOLN
0.0300 [IU]/min | INTRAVENOUS | Status: DC
  Administered 2017-03-04 – 2017-03-07 (×4): 0.03 [IU]/min via INTRAVENOUS
  Filled 2017-03-04 (×4): qty 2

## 2017-03-04 NOTE — Progress Notes (Signed)
Pulmonary/critical care  Procedure note Dialysis catheter placement Indication: Renal failure requiring CRRT Consent is obtained and on chart Procedural pause performed Performed under ultrasound guidance Complete contact barrier precautions utilized Subdermal lidocaine given Under direct visualization the left internal jugular vein was cannulated without difficulty First pass was unable to pass guidewire. Repositioned subsequently successful pass guidewire Needle was removed Using the Seldinger technique the subcutaneous tissue was dilated Dialysis catheter was placed Guidewire was removed intact Both ports had dark nonpulsatile venous return Flushed Stat portable chest x-ray ordered reveals adequate placement of dialysis catheter without any obvious consultation  Hermelinda Dellen, D.O.

## 2017-03-04 NOTE — Procedures (Signed)
Arterial Catheter Insertion Procedure Note Kyle Vasquez [id number] Jan 11, 1932  Procedure: Insertion of Arterial Catheter  Indications: Blood pressure monitoring and Frequent blood sampling  Procedure Details Consent: Unable to obtain consent because of emergent medical necessity. Time Out: Verified patient identification, verified procedure, site/side was marked, verified correct patient position, special equipment/implants available, medications/allergies/relevent history reviewed, required imaging and test results available.  Performed  Maximum sterile technique was used including antiseptics, cap, gloves, gown, hand hygiene, mask and sheet. Skin prep: Chlorhexidine; local anesthetic administered 20 gauge catheter was inserted into right femoral artery using the Seldinger technique.  Evaluation Blood flow good; BP tracing good. Complications: No apparent complications.  Procedure performed under direct supervision of Dr.Conforti. Ultrasound utilized for realtime vessel cannulation   Amos Micheals [facility] ANP-BC Pulmonary and Critical Care Medicine [facility] Pager [phone] or [phone] 03/04/2017

## 2017-03-04 NOTE — Progress Notes (Signed)
UNMATCHED BLOOD PRODUCT NOTE  Compare the patient ID on the blood tag to the patient ID on the hospital armband and Blood Bank armband. Then confirm the unit number on the blood tag matches the unit number on the blood product.  If a discrepancy is discovered return the product to blood bank immediately.   Blood Product Type: Fresh Frozen Plasma   Start Time: 0400  Starting Rate:  999   Stop Time: 7579   All Other Documentation should be documented within the Blood Admin Flowsheet per policy.   FFP given during code situation. Green sheet misplaced during code. No signs or symptoms of reaction noted.

## 2017-03-04 NOTE — Consult Note (Signed)
[facility] Cardiology  CARDIOLOGY CONSULT NOTE  Patient ID: Kyle Vasquez MRN: [id number] DOB/AGE: Jan 27, 1932 83 y.o.  Admit date: 03/23/2017 Referring Physician [facility]ician [facility] Primary Cardiologist Fath Reason for Consultation status post cardiopulmonary arrest  HPI: 83 year old gentleman referred for evaluation following cardiopulmonary arrest.  Patient has a history of atrial fibrillation, sick sinus syndrome status post pacemaker, chronic diastolic congestive heart failure, chronic kidney disease,.  The patient was complaining of shortness of breath, collapsed, EMS was called, CPR was performed, the patient was brought to [facility] emergency room where he was intubated, noted to have a core temperature of 83 F, with metabolic acidosis, INR 7, marked anemia with hemoglobin 6.7, and admission creatinine of 4.01.  ECG reveals atrial pacing with ventricular pacing.  Admission troponin was 0.38.  The patient currently is a DO NOT RESUSCITATE, awaiting short-term dialysis.  Review of systems complete and found to be negative unless listed above     Past Medical History:  Diagnosis Date  . Anemia   . Atrial fibrillation ([place])   . Bradycardia   . Chronic kidney disease   . COPD (chronic obstructive pulmonary disease) ([place])   . Coronary artery disease   . Depression   . Heart murmur   . HLD (hyperlipidemia)   . Hypertension   . Hypothyroidism   . Lumbar myelopathy ([place])   . MDS (myelodysplastic syndrome) ([place])   . OSA (obstructive sleep apnea)   . Stroke [facility])    left sided weakness  . Syncope     Past Surgical History:  Procedure Laterality Date  . ABDOMINAL AORTIC ANEURYSM REPAIR    . BACK SURGERY    . CARDIAC SURGERY     mitral valve surgery  . CARDIAC VALVE REPLACEMENT    . NECK SURGERY    . PACEMAKER INSERTION Left 07/20/2016   Procedure: INSERTION PACEMAKER;  Surgeon: Isaias Cowman, MD;  Location: [facility] ORS;  Service: Cardiovascular;  Laterality: Left;     Medications Prior to Admission  Medication Sig Dispense Refill Last Dose  . acetaminophen (TYLENOL) 325 MG tablet Take 1 tablet (325 mg total) by mouth every 6 (six) hours as needed for mild pain.   prn at prn  . albuterol (PROVENTIL HFA;VENTOLIN HFA) 108 (90 Base) MCG/ACT inhaler Inhale 2 puffs into the lungs every 6 (six) hours as needed for wheezing or shortness of breath.   prn at prn  . amLODipine (NORVASC) 2.5 MG tablet Take 1 tablet by mouth daily.     Marland Kitchen amLODipine (NORVASC) 5 MG tablet Take 1 tablet (5 mg total) by mouth daily. 30 tablet 0 01/31/2017 at 0800  . aspirin EC 81 MG tablet Take 81 mg by mouth daily.   01/31/2017 at 0800  . azithromycin (ZITHROMAX) 250 MG tablet 250mg  once daily for 4 more days 4 each 0   . cyanocobalamin 500 MCG tablet Take 500 mcg by mouth 2 (two) times daily.   01/31/2017 at 1800  . docusate sodium (COLACE) 100 MG capsule Take 1 capsule (100 mg total) by mouth 2 (two) times daily as needed for mild constipation. 10 capsule 0 01/31/2017 at 1800  . Fluticasone-Salmeterol (ADVAIR) 250-50 MCG/DOSE AEPB Inhale 1 puff into the lungs 2 (two) times daily.   01/31/2017 at 1800  . HYDROcodone-acetaminophen (NORCO/VICODIN) 5-325 MG tablet Take 1-2 tablets by mouth every 4 (four) hours as needed for moderate pain. (Patient not taking: Reported on 01/27/2017) 30 tablet 0 prn at prn  . levothyroxine (SYNTHROID, LEVOTHROID) 25 MCG tablet  Take 1 tablet (25 mcg total) by mouth daily before breakfast. 30 tablet 0 01/31/2017 at 0800  . Multiple Vitamins-Minerals (MULTIVITAMIN WITH MINERALS) tablet Take 1 tablet by mouth daily.   01/31/2017 at 0800  . rosuvastatin (CRESTOR) 5 MG tablet Take 1 tablet (5 mg total) by mouth daily at 6 PM. 30 tablet  01/31/2017 at 1800  . tamsulosin (FLOMAX) 0.4 MG CAPS capsule Take 1 capsule (0.4 mg total) by mouth daily. 30 capsule 0 01/31/2017 at 0800  . warfarin (COUMADIN) 3 MG tablet Take 3 mg by mouth daily. Take as directed by anticoagulation clinic    01/31/2017 at 0800   Social History   Socioeconomic History  . Marital status: Married    Spouse name: Not on file  . Number of children: Not on file  . Years of education: Not on file  . Highest education level: Not on file  Social Needs  . Financial resource strain: Not on file  . Food insecurity - worry: Not on file  . Food insecurity - inability: Not on file  . Transportation needs - medical: Not on file  . Transportation needs - non-medical: Not on file  Occupational History  . Not on file  Tobacco Use  . Smoking status: Former Research scientist (life sciences)  . Smokeless tobacco: Never Used  Substance and Sexual Activity  . Alcohol use: No  . Drug use: No  . Sexual activity: Not on file  Other Topics Concern  . Not on file  Social History Narrative   Lives at home with wife, bedbound at baseline    Family History  Problem Relation Age of Onset  . Hypertension Mother   . Congestive Heart Failure Father   . Hypertension Father       Review of systems complete and found to be negative unless listed above      PHYSICAL EXAM  General: Well developed, well nourished, in no acute distress HEENT:  Normocephalic and atramatic Neck:  No JVD.  Lungs: Clear bilaterally to auscultation and percussion. Heart: HRRR . Normal S1 and S2 without gallops or murmurs.  Abdomen: Bowel sounds are positive, abdomen soft and non-tender  Msk:  Back normal, normal gait. Normal strength and tone for age. Extremities: No clubbing, cyanosis or edema.   Neuro: Alert and oriented X 3. Psych:  Good affect, responds appropriately  Labs:   Lab Results  Component Value Date   WBC 2.2 (L) 03/04/2017   HGB 9.2 (L) 03/04/2017   HCT 27.5 (L) 03/04/2017   MCV 91.8 03/04/2017   PLT 110 (L) 03/04/2017    Recent Labs  Lab 03/04/17 0608  NA 140  K 5.6*  CL 118*  CO2 17*  BUN 65*  CREATININE 3.69*  CALCIUM 7.5*  PROT 5.6*  BILITOT 0.9  ALKPHOS 67  ALT 60  AST 110*  GLUCOSE 41*   Lab Results   Component Value Date   CKTOTAL 154 09/19/2016   CKMB 2.0 04/30/2013   TROPONINI 0.38 (HH) 03/04/2017    Lab Results  Component Value Date   CHOL 209 (H) 09/19/2016   Lab Results  Component Value Date   HDL 37 (L) 09/19/2016   Lab Results  Component Value Date   LDLCALC 150 (H) 09/19/2016   Lab Results  Component Value Date   TRIG 110 09/19/2016   Lab Results  Component Value Date   CHOLHDL 5.6 09/19/2016   No results found for: LDLDIRECT    Radiology: Dg Abdomen 1 View  Result  Date: 03/01/2017 CLINICAL DATA:  Encounter for orogastric tube placement. EXAM: ABDOMEN - 1 VIEW COMPARISON:  None. FINDINGS: The tip and side port of a gastric tube are seen in the expected location of the stomach. The included heart is enlarged. Pleural thickening and/or small loculated fluid is noted along the periphery the right hemithorax. Partially visualized distal aortic stent graft. Lumbar fusion hardware noted at L2-3. No radio-opaque calculi or other significant radiographic abnormality are seen. IMPRESSION: 1. Gastric tube in the expected location of the stomach. 2. Cardiomegaly with pleural thickening versus small loculated pleural fluid. Electronically Signed   By: Ashley Royalty M.D.   On: 03/24/2017 19:04   Dg Chest Port 1 View  Result Date: 03/04/2017 CLINICAL DATA:  82 year old male with acute respiratory failure. EXAM: PORTABLE CHEST 1 VIEW COMPARISON:  Chest radiograph dated 03/07/2017 FINDINGS: The endotracheal tube remains above the carina in stable positioning. There is right-sided pleural effusion and right lung base atelectasis/ infiltrate. Diffuse interstitial coarsening and left lung base atelectatic changes. Pleural thickening or loculated fluid in the right upper lobe. Stable cardiac silhouette. Atherosclerotic calcification of the aortic arch. Left pectoral pacemaker device. IMPRESSION: Right-sided pleural effusion and right lung base atelectasis/ infiltrate. Overall no significant  interval change in the appearance of the lungs compared to the earlier radiograph. Electronically Signed   By: Anner Crete M.D.   On: 03/04/2017 07:17   Dg Chest Portable 1 View  Result Date: 03/25/2017 CLINICAL DATA:  Patient found on floor by family in with agonal breathing. EXAM: PORTABLE CHEST 1 VIEW COMPARISON:  02/01/2017 FINDINGS: Cardiomegaly with aortic atherosclerosis. Endotracheal tube is noted 4.3 cm above the carina in satisfactory position. Pulmonary vascular redistribution is seen consistent with pulmonary edema. Superimposed airspace opacities in the periphery of the right upper and central left upper lobes cannot concomitant pneumonia. No left effusion. The right lateral costophrenic angle is included as part of the abdomen radiographs acquired sequentially with this exam. There does appear to be pleural thickening or a small fluid loculation along the periphery of the right hemithorax. Spinal fusion hardware noted of the included lower cervical spine. External defibrillator paddles project over the cardiac silhouette. No acute osseous abnormality. IMPRESSION: 1. Cardiomegaly with aortic atherosclerosis. Pleural thickening versus small amount of loculated fluid along the periphery of the visualized right hemithorax. 2. Satisfactory endotracheal tube position. 3. Diffuse pulmonary vascular redistribution consistent with pulmonary edema. 4. Superimposed pneumonia in the upper lobes is not excluded. Electronically Signed   By: Ashley Royalty M.D.   On: 03/22/2017 18:53    EKG: Atrial pacing with ventricular pacing  ASSESSMENT AND PLAN:   1.  Status post cardiopulmonary arrest, with lactic acidosis, acute renal failure, marked anemia, stable heart rhythm, likely not due to acute coronary syndrome or ST elevation myocardial infarction, made DNR, awaiting short-term dialysis  Recommendations  1.  Agree with overall current therapy 2.  Defer emergent cardiac catheterization 3.  Agree  with short-term dialysis 4.  DNR  Signed: Isaias Cowman MD,PhD, [facility] 03/04/2017, 9:23 AM

## 2017-03-04 NOTE — Progress Notes (Signed)
ANTICOAGULATION CONSULT NOTE - Initial Consult  Pharmacy Consult for [facility] Indication: elevated INR   No Known Allergies  Patient Measurements: Height: 5\' 6"  (167.6 cm) Weight: 156 lb 15.5 oz (71.2 kg) IBW/kg (Calculated) : 63.8 Heparin Dosing Weight:   Vital Signs: Temp: 95.7 F (35.4 C) (12/08 1500) Temp Source: Core (12/08 0700) BP: 90/60 (12/08 1500) Pulse Rate: 72 (12/08 1500)  Labs: Recent Labs    03/13/2017 1827  03/04/17 0102 03/04/17 0331 03/04/17 0332 03/04/17 0608 03/04/17 1048  HGB 6.7*  --  6.9* 6.6*  --  9.2*  --   HCT 21.2*  --  20.9* 20.4*  --  27.5*  --   PLT 77*  --  74* 70*  --  110*  --   APTT 80*  --   --   --   --  48*  --   LABPROT 61.0*   < >  --   --  25.7* 21.9* 21.3*  INR 7.16*   < >  --   --  2.37 1.93 1.86  CREATININE 4.01*   < >  --  3.65*  --  3.69* 3.33*  TROPONINI 0.03*  --   --   --   --  0.38*  --    < > = values in this interval not displayed.    Estimated Creatinine Clearance: 15.2 mL/min (A) (by C-G formula based on SCr of 3.33 mg/dL (H)).   Medical History: Past Medical History:  Diagnosis Date  . Anemia   . Atrial fibrillation ([place])   . Bradycardia   . Chronic kidney disease   . COPD (chronic obstructive pulmonary disease) ([place])   . Coronary artery disease   . Depression   . Heart murmur   . HLD (hyperlipidemia)   . Hypertension   . Hypothyroidism   . Lumbar myelopathy ([place])   . MDS (myelodysplastic syndrome) ([place])   . OSA (obstructive sleep apnea)   . Stroke [facility])    left sided weakness  . Syncope     Medications:  Medications Prior to Admission  Medication Sig Dispense Refill Last Dose  . acetaminophen (TYLENOL) 325 MG tablet Take 1 tablet (325 mg total) by mouth every 6 (six) hours as needed for mild pain.   prn at prn  . albuterol (PROVENTIL HFA;VENTOLIN HFA) 108 (90 Base) MCG/ACT inhaler Inhale 2 puffs into the lungs every 6 (six) hours as needed for wheezing or shortness of breath.   prn at prn  .  amLODipine (NORVASC) 2.5 MG tablet Take 1 tablet by mouth daily.     Marland Kitchen amLODipine (NORVASC) 5 MG tablet Take 1 tablet (5 mg total) by mouth daily. 30 tablet 0 01/31/2017 at 0800  . aspirin EC 81 MG tablet Take 81 mg by mouth daily.   01/31/2017 at 0800  . azithromycin (ZITHROMAX) 250 MG tablet 250mg  once daily for 4 more days 4 each 0   . cyanocobalamin 500 MCG tablet Take 500 mcg by mouth 2 (two) times daily.   01/31/2017 at 1800  . docusate sodium (COLACE) 100 MG capsule Take 1 capsule (100 mg total) by mouth 2 (two) times daily as needed for mild constipation. 10 capsule 0 01/31/2017 at 1800  . Fluticasone-Salmeterol (ADVAIR) 250-50 MCG/DOSE AEPB Inhale 1 puff into the lungs 2 (two) times daily.   01/31/2017 at 1800  . HYDROcodone-acetaminophen (NORCO/VICODIN) 5-325 MG tablet Take 1-2 tablets by mouth every 4 (four) hours as needed for moderate pain. (Patient not taking: Reported  on 01/27/2017) 30 tablet 0 prn at prn  . levothyroxine (SYNTHROID, LEVOTHROID) 25 MCG tablet Take 1 tablet (25 mcg total) by mouth daily before breakfast. 30 tablet 0 01/31/2017 at 0800  . Multiple Vitamins-Minerals (MULTIVITAMIN WITH MINERALS) tablet Take 1 tablet by mouth daily.   01/31/2017 at 0800  . rosuvastatin (CRESTOR) 5 MG tablet Take 1 tablet (5 mg total) by mouth daily at 6 PM. 30 tablet  01/31/2017 at 1800  . tamsulosin (FLOMAX) 0.4 MG CAPS capsule Take 1 capsule (0.4 mg total) by mouth daily. 30 capsule 0 01/31/2017 at 0800  . warfarin (COUMADIN) 3 MG tablet Take 3 mg by mouth daily. Take as directed by anticoagulation clinic   01/31/2017 at 0800    Assessment: Pharmacy consulted to dose/monitor Eppie Gibson in this 83 year old male s/p cardiac arrest .  Pt had cardiac arrest secondary to hypotension due to GI bleed.   Pt was on warfarin at home ;  12/07 INR = 7.16  Goal of Therapy:  INR < 2    Plan:  Will order INR 1 hr post KCentra administration and then check INR Q6H X 4 and then check daily X 2 .   12/8 @ 06:08   INR = 1.93 12/8 @ 10:48  INR = 1.86  Monike Bragdon K, RPH 03/04/2017,3:32 PM

## 2017-03-04 NOTE — Progress Notes (Signed)
Patient ID: Khambrel Amsden, male   DOB: Aug 25, 1931, 83 y.o.   MRN: [id number]  [facility] PROGRESS NOTE  Elvis Laufer ZJQ:[id number] DOB: Apr 01, 1931 DOA: 03/14/2017 PCP: Tracie Harrier, MD  HPI/Subjective: Patient intubated and sedated  Objective: Vitals:   03/04/17 1200 03/04/17 1300  BP: 113/71 96/68  Pulse: 66 71  Resp: 20 20  Temp: (!) 95.2 F (35.1 C) (!) 94.6 F (34.8 C)  SpO2: 97% 97%    Filed Weights   03/15/2017 1825 03/05/2017 2130 03/05/2017 2232  Weight: 91 kg (200 lb 9.9 oz) 81.5 kg (179 lb 10.8 oz) 71.2 kg (156 lb 15.5 oz)    ROS: Review of Systems  Unable to perform ROS: Acuity of condition   Exam: Physical Exam  Constitutional: He is intubated.  HENT:  Nose: No mucosal edema.  Unable to look into mouth  Eyes: Conjunctivae and lids are normal.  Neck: Carotid bruit is not present. No thyromegaly present.  Cardiovascular: S1 normal, S2 normal and normal heart sounds.  Pulses:      Dorsalis pedis pulses are 1+ on the right side, and 1+ on the left side.  Respiratory: He is intubated. He has decreased breath sounds in the right lower field and the left lower field. He has rhonchi in the right middle field, the right lower field and the left lower field.  GI: Soft. Bowel sounds are decreased. There is no tenderness.  Musculoskeletal:       Right ankle: He exhibits swelling.       Left ankle: He exhibits swelling.  Neurological: He is unresponsive.  Skin: Skin is dry. No rash noted. Nails show no clubbing.  Psychiatric:  Patient intubated and sedated.      Data Reviewed: Basic Metabolic Panel: Recent Labs  Lab 03/16/2017 1827 02/27/2017 2138 03/27/2017 2320 03/04/17 0331 03/04/17 0608 03/04/17 1048  NA 137  --  138 138 140 139  K 6.2*  --  5.9* 5.8* 5.6* 5.4*  CL 115*  --  117* 118* 118* 116*  CO2 16*  --  15* 14* 17* 17*  GLUCOSE 134*  --  95 46* 41* 61*  BUN 73*  --  72* 68* 65* 63*  CREATININE 4.01*  --  3.76* 3.65* 3.69* 3.33*  CALCIUM  8.9  --  8.1* 7.9* 7.5* 7.4*  MG  --  2.5* 2.4  --  2.1  --   PHOS  --  4.0 4.2  --  4.5 3.7   Liver Function Tests: Recent Labs  Lab 03/07/2017 1827 03/04/17 0331 03/04/17 0608 03/04/17 1048  AST 82* 91* 110*  --   ALT 52 51 60  --   ALKPHOS 73 61 67  --   BILITOT 0.5 0.7 0.9  --   PROT 6.0* 5.1* 5.6*  --   ALBUMIN 2.6* 2.2* 2.3* 2.2*   No results for input(s): LIPASE, AMYLASE in the last 168 hours. No results for input(s): AMMONIA in the last 168 hours. CBC: Recent Labs  Lab 03/14/2017 1827 03/04/17 0102 03/04/17 0331 03/04/17 0608  WBC 2.2* 1.9* 1.7* 2.2*  NEUTROABS 1.9  --   --   --   HGB 6.7* 6.9* 6.6* 9.2*  HCT 21.2* 20.9* 20.4* 27.5*  MCV 96.4 94.0 96.7 91.8  PLT 77* 74* 70* 110*   Cardiac Enzymes: Recent Labs  Lab 03/27/2017 1827 03/04/17 0608  TROPONINI 0.03* 0.38*   BNP (last 3 results) No results for input(s): BNP in the last 8760 hours.  ProBNP (last  3 results) No results for input(s): PROBNP in the last 8760 hours.  CBG: Recent Labs  Lab 03/16/2017 2132 03/04/17 0724 03/04/17 1132  GLUCAP 106* 118* 76    Recent Results (from the past 240 hour(s))  MRSA PCR Screening     Status: None   Collection Time: 03/01/2017  9:15 PM  Result Value Ref Range Status   MRSA by PCR NEGATIVE NEGATIVE Final    Comment:        The GeneXpert MRSA Assay (FDA approved for NASAL specimens only), is one component of a comprehensive MRSA colonization surveillance program. It is not intended to diagnose MRSA infection nor to guide or monitor treatment for MRSA infections.   Culture, blood (Routine X 2) w Reflex to ID Panel     Status: None (Preliminary result)   Collection Time: 03/21/2017  9:39 PM  Result Value Ref Range Status   Specimen Description BLOOD LEFT HAND  Final   Special Requests   Final    BOTTLES DRAWN AEROBIC AND ANAEROBIC Blood Culture adequate volume   Culture NO GROWTH < 12 HOURS  Final   Report Status PENDING  Incomplete  Culture, blood  (Routine X 2) w Reflex to ID Panel     Status: None (Preliminary result)   Collection Time: 03/20/2017  9:43 PM  Result Value Ref Range Status   Specimen Description BLOOD RIGHT HAND  Final   Special Requests   Final    BOTTLES DRAWN AEROBIC AND ANAEROBIC Blood Culture adequate volume   Culture NO GROWTH < 12 HOURS  Final   Report Status PENDING  Incomplete     Studies: Dg Abdomen 1 View  Result Date: 03/22/2017 CLINICAL DATA:  Encounter for orogastric tube placement. EXAM: ABDOMEN - 1 VIEW COMPARISON:  None. FINDINGS: The tip and side port of a gastric tube are seen in the expected location of the stomach. The included heart is enlarged. Pleural thickening and/or small loculated fluid is noted along the periphery the right hemithorax. Partially visualized distal aortic stent graft. Lumbar fusion hardware noted at L2-3. No radio-opaque calculi or other significant radiographic abnormality are seen. IMPRESSION: 1. Gastric tube in the expected location of the stomach. 2. Cardiomegaly with pleural thickening versus small loculated pleural fluid. Electronically Signed   By: Ashley Royalty M.D.   On: 03/21/2017 19:04   Dg Chest Port 1 View  Result Date: 03/04/2017 CLINICAL DATA:  Central line placement EXAM: PORTABLE CHEST - 1 VIEW COMPARISON:  Earlier film of the same day FINDINGS: Left IJ HD catheter is been placed, tip near the junction of the left innominate vein to the SVC. No pneumothorax. Endotracheal tube and nasogastric tube remain in place. Left subclavian transvenous pacemaker incompletely visualized. Cervical fixation hardware noted. Coarse moderate interstitial airspace opacities throughout the visualized lungs. The bases are excluded. Visualized bones unremarkable. IMPRESSION: 1. Left IJ central line to the junction of left innominate vein to the SVC. No pneumothorax. Electronically Signed   By: Lucrezia Europe M.D.   On: 03/04/2017 10:12   Dg Chest Port 1 View  Result Date: 03/04/2017 CLINICAL  DATA:  83 year old male with acute respiratory failure. EXAM: PORTABLE CHEST 1 VIEW COMPARISON:  Chest radiograph dated 03/20/2017 FINDINGS: The endotracheal tube remains above the carina in stable positioning. There is right-sided pleural effusion and right lung base atelectasis/ infiltrate. Diffuse interstitial coarsening and left lung base atelectatic changes. Pleural thickening or loculated fluid in the right upper lobe. Stable cardiac silhouette. Atherosclerotic calcification of the  aortic arch. Left pectoral pacemaker device. IMPRESSION: Right-sided pleural effusion and right lung base atelectasis/ infiltrate. Overall no significant interval change in the appearance of the lungs compared to the earlier radiograph. Electronically Signed   By: Anner Crete M.D.   On: 03/04/2017 07:17   Dg Chest Portable 1 View  Result Date: 02/27/2017 CLINICAL DATA:  Patient found on floor by family in with agonal breathing. EXAM: PORTABLE CHEST 1 VIEW COMPARISON:  02/01/2017 FINDINGS: Cardiomegaly with aortic atherosclerosis. Endotracheal tube is noted 4.3 cm above the carina in satisfactory position. Pulmonary vascular redistribution is seen consistent with pulmonary edema. Superimposed airspace opacities in the periphery of the right upper and central left upper lobes cannot concomitant pneumonia. No left effusion. The right lateral costophrenic angle is included as part of the abdomen radiographs acquired sequentially with this exam. There does appear to be pleural thickening or a small fluid loculation along the periphery of the right hemithorax. Spinal fusion hardware noted of the included lower cervical spine. External defibrillator paddles project over the cardiac silhouette. No acute osseous abnormality. IMPRESSION: 1. Cardiomegaly with aortic atherosclerosis. Pleural thickening versus small amount of loculated fluid along the periphery of the visualized right hemithorax. 2. Satisfactory endotracheal tube  position. 3. Diffuse pulmonary vascular redistribution consistent with pulmonary edema. 4. Superimposed pneumonia in the upper lobes is not excluded. Electronically Signed   By: Ashley Royalty M.D.   On: 03/15/2017 18:53    Scheduled Meds: . chlorhexidine gluconate (MEDLINE KIT)  15 mL Mouth Rinse BID  . mouth rinse  15 mL Mouth Rinse 10 times per day  . sodium chloride flush  3 mL Intravenous Q12H   Continuous Infusions: . dextrose 50 mL/hr at 03/04/17 1100  . fentaNYL infusion INTRAVENOUS 200 mcg/hr (03/04/17 1100)  . norepinephrine (LEVOPHED) Adult infusion 30 mcg/min (03/04/17 1233)  . pantoprozole (PROTONIX) infusion 8 mg/hr (03/04/17 1100)  . phenylephrine (NEO-SYNEPHRINE) Adult infusion 200 mcg/min (03/04/17 1308)  . piperacillin-tazobactam (ZOSYN)  IV 3.375 g (03/04/17 1219)  . pureflow 2,000 mL/hr at 03/04/17 1036  .  sodium bicarbonate  infusion 1000 mL 125 mL/hr at 03/04/17 1259  . vancomycin    . vasopressin (PITRESSIN) infusion - *FOR SHOCK* 0.03 Units/min (03/04/17 1100)    Assessment/Plan:  1. Acute cardiac arrest.  Supportive care.  2. Hypovolemic shock versus cardiogenic shock.  Patient on 3 pressors to maintain blood pressure. 3. Acute hypoxic respiratory failure.  Continue ventilatory support.  Patient on 50% FiO2. 4. Acute anuric renal failure.  Patient started on CRRT today. 5. Acute blood loss anemia with supratherapeutic INR.  Patient received 2 units of packed red blood cells with good response.  Patient received vitamin K and K Centra. 6. Metabolic acidosis.  Continue vent management 7. Clinical sepsis with pneumonia on empiric antibiotics with vancomycin and Zosyn. 8. History of atrial fibrillation.  Holding all meds with hypotension and GI bleed 9. History of BPH.  Holding Flomax with hypotension 10. Hyperlipidemia unspecified holding Crestor 11. Hypothyroidism unspecified holding levothyroxine.  Code Status:     Code Status Orders  (From admission,  onward)        Start     Ordered   03/04/17 0955  Do not attempt resuscitation (DNR)  Continuous    Question Answer Comment  In the event of cardiac or respiratory ARREST Do not call a "code blue"   In the event of cardiac or respiratory ARREST Do not perform Intubation, CPR, defibrillation or ACLS   In the event  of cardiac or respiratory ARREST Use medication by any route, position, wound care, and other measures to relive pain and suffering. May use oxygen, suction and manual treatment of airway obstruction as needed for comfort.      03/04/17 0954    Code Status History    Date Active Date Inactive Code Status Order ID Comments User Context   03/02/2017 19:38 03/04/2017 09:54 Full Code [id number]  Hillary Bow, MD ED   01/27/2017 20:17 01/28/2017 20:34 DNR [id number]  Max Sane, MD ED   12/12/2016 15:56 12/14/2016 20:28 DNR [id number]  Vaughan Basta, MD Inpatient   09/19/2016 19:02 09/21/2016 19:55 Full Code [id number]  Bettey Costa, MD Inpatient   07/20/2016 16:50 07/21/2016 16:51 Full Code [id number]  Isaias Cowman, MD Inpatient   06/29/2016 19:37 07/01/2016 21:03 Full Code [id number]  Gladstone Lighter, MD Inpatient   09/07/2015 21:31 09/08/2015 19:58 Full Code [id number]  Quintella Baton, MD Inpatient    Advance Directive Documentation     Most Recent Value  Type of Advance Directive  Living will  Pre-existing out of facility DNR order (yellow form or pink MOST form)  No data  "MOST" Form in Place?  No data     Family Communication: Patient's son and patient's niece at the bedside when I rounded. Disposition Plan: To be determined based on clinical course.  Consultants:  Critical care specialist  Nephrology  Cardiology  Antibiotics:  Vancomycin  Zosyn  Time spent: 28 minutes  [place]

## 2017-03-04 NOTE — Consult Note (Signed)
Kyle Darby, MD [address]  [place]  [place], [place] [address]  Main: [phone]  Fax: [phone] Pager: [phone]   Consultation  Referring Provider:     No ref. provider found Primary Care Physician:  Tracie Harrier, MD Primary Gastroenterologist: None       Reason for Consultation:     Coffee ground emesis  Date of Admission:  03/27/2017 Date of Consultation:  03/04/2017         HPI:   Kyle Vasquez is a 83 y.o. male with h/o CHF, A. fib on Coumadin, sick sinus syndrome status post pacemaker admitted to ICU after cardiac arrest at his home, underwent CPR for 76mnutes and currently in shock requiring pressor support. Is currently on CVVHD. He was found to have supratherapeutic INR, hemoglobin 6.7 on admission. He also has thrombocytopenia. He was noted to have coffee-ground material from the OG tube. GI is consulted for further management. He is started on PPI drip. He is currently made DO NOT RESUSCITATE Family is bedside. Per his son, no known history of cirrhosis. No prior episodes of GI bleed  Per his nurse, he did not have further episodes of hematemesis or coffee-ground emesis, rectal bleeding or hematochezia. He responded appropriately to PRBCs. His current hemoglobin is 9.2 as of this morning  NSAIDs: None  Antiplts/Anticoagulants/Anti thrombotics: Coumadin  GI Procedures: Unknown  Past Medical History:  Diagnosis Date  . Anemia   . Atrial fibrillation ([facility]   . Bradycardia   . Chronic kidney disease   . COPD (chronic obstructive pulmonary disease) ([facility]   . Coronary artery disease   . Depression   . Heart murmur   . HLD (hyperlipidemia)   . Hypertension   . Hypothyroidism   . Lumbar myelopathy ([facility]   . MDS (myelodysplastic syndrome) ([facility]   . OSA (obstructive sleep apnea)   . Stroke ([facility]    left sided weakness  . Syncope     Past Surgical History:  Procedure Laterality Date  . ABDOMINAL AORTIC ANEURYSM REPAIR    . BACK  SURGERY    . CARDIAC SURGERY     mitral valve surgery  . CARDIAC VALVE REPLACEMENT    . NECK SURGERY    . PACEMAKER INSERTION Left 07/20/2016   Procedure: INSERTION PACEMAKER;  Surgeon: AIsaias Cowman MD;  Location: [facility] ORS;  Service: Cardiovascular;  Laterality: Left;    Prior to Admission medications   Medication Sig Start Date End Date Taking? Authorizing Provider  acetaminophen (TYLENOL) 325 MG tablet Take 1 tablet (325 mg total) by mouth every 6 (six) hours as needed for mild pain. 12/14/16   Gouru, AIllene Silver MD  albuterol (PROVENTIL HFA;VENTOLIN HFA) 108 (90 Base) MCG/ACT inhaler Inhale 2 puffs into the lungs every 6 (six) hours as needed for wheezing or shortness of breath.    [provider]  amLODipine (NORVASC) 2.5 MG tablet Take 1 tablet by mouth daily. 02/27/17   [provider]  amLODipine (NORVASC) 5 MG tablet Take 1 tablet (5 mg total) by mouth daily. 07/01/16   PFritzi Mandes MD  aspirin EC 81 MG tablet Take 81 mg by mouth daily.    [provider]  azithromycin (ZITHROMAX) 250 MG tablet 2536monce daily for 4 more days 02/01/17   LoLisa RocaMD  cyanocobalamin 500 MCG tablet Take 500 mcg by mouth 2 (two) times daily.    [provider]  docusate sodium (COLACE) 100 MG capsule Take 1 capsule (100 mg  total) by mouth 2 (two) times daily as needed for mild constipation. 12/14/16   Gouru, Illene Silver, MD  Fluticasone-Salmeterol (ADVAIR) 250-50 MCG/DOSE AEPB Inhale 1 puff into the lungs 2 (two) times daily.    [provider]  HYDROcodone-acetaminophen (NORCO/VICODIN) 5-325 MG tablet Take 1-2 tablets by mouth every 4 (four) hours as needed for moderate pain. Patient not taking: Reported on 01/27/2017 09/21/16   Epifanio Lesches, MD  levothyroxine (SYNTHROID, LEVOTHROID) 25 MCG tablet Take 1 tablet (25 mcg total) by mouth daily before breakfast. 07/22/16   Clabe Seal, PA-C  Multiple Vitamins-Minerals (MULTIVITAMIN WITH MINERALS) tablet Take 1  tablet by mouth daily.    [provider]  rosuvastatin (CRESTOR) 5 MG tablet Take 1 tablet (5 mg total) by mouth daily at 6 PM. 07/21/16   Clabe Seal, PA-C  tamsulosin (FLOMAX) 0.4 MG CAPS capsule Take 1 capsule (0.4 mg total) by mouth daily. 01/28/17   Vaughan Basta, MD  warfarin (COUMADIN) 3 MG tablet Take 3 mg by mouth daily. Take as directed by anticoagulation clinic    [provider]    Family History  Problem Relation Age of Onset  . Hypertension Mother   . Congestive Heart Failure Father   . Hypertension Father      Social History   Tobacco Use  . Smoking status: Former Research scientist (life sciences)  . Smokeless tobacco: Never Used  Substance Use Topics  . Alcohol use: No  . Drug use: No    Allergies as of 03/10/2017  . (No Known Allergies)    Review of Systems:    All systems reviewed and negative except where noted in HPI.   Physical Exam:  Vital signs in last 24 hours: Temp:  [85.1 F (29.5 C)-98.1 F (36.7 C)] 95.7 F (35.4 C) (12/08 1500) Pulse Rate:  [26-111] 72 (12/08 1500) Resp:  [0-23] 0 (12/08 1500) BP: (56-156)/(44-89) 90/60 (12/08 1500) SpO2:  [86 %-100 %] 95 % (12/08 1500) Arterial Line BP: (64-179)/(37-98) 104/57 (12/08 1500) FiO2 (%):  [40 %-100 %] 50 % (12/08 1420) Weight:  [156 lb 15.5 oz (71.2 kg)-200 lb 9.9 oz (91 kg)] 156 lb 15.5 oz (71.2 kg) (12/07 2232) Last BM Date: 03/18/2017 General:   Intubated, sedated, not in distress Head:  Normocephalic and atraumatic. Eyes:   No icterus.   Conjunctiva pink. Sluggish reacting to light Ears:  Normal auditory acuity. Neck:  Supple; no masses or thyroidomegaly Lungs: Respirations even and unlabored. Lungs clear to auscultation bilaterally.   No wheezes, crackles, or rhonchi.  Heart:  Tachycardia, irregular rhythm Abdomen:  Soft, nondistended, nontender. Normal bowel sounds. No appreciable masses or hepatomegaly.  No rebound or guarding.  Rectal:  Not performed. Msk:  Symmetrical without gross  deformities.  Extremities:  Without edema, cyanosis or clubbing. Neurologic: Sedated   LAB RESULTS: CBC Latest Ref Rng & Units 03/04/2017 03/04/2017 03/04/2017  WBC 3.8 - 10.6 K/uL 2.2(L) 1.7(L) 1.9(L)  Hemoglobin 13.0 - 18.0 g/dL 9.2(L) 6.6(L) 6.9(L)  Hematocrit 40.0 - 52.0 % 27.5(L) 20.4(L) 20.9(L)  Platelets 150 - 440 K/uL 110(L) 70(L) 74(L)    BMET BMP Latest Ref Rng & Units 03/04/2017 03/04/2017 03/04/2017  Glucose 65 - 99 mg/dL 61(L) 77 41(LL)  BUN 6 - 20 mg/dL 63(H) 52(H) 65(H)  Creatinine 0.61 - 1.24 mg/dL 3.33(H) 2.82(H) 3.69(H)  Sodium 135 - 145 mmol/L 139 139 140  Potassium 3.5 - 5.1 mmol/L 5.4(H) 5.1 5.6(H)  Chloride 101 - 111 mmol/L 116(H) 111 118(H)  CO2 22 - 32 mmol/L 17(L)  21(L) 17(L)  Calcium 8.9 - 10.3 mg/dL 7.4(L) 7.2(L) 7.5(L)    LFT Hepatic Function Latest Ref Rng & Units 03/04/2017 03/04/2017 03/04/2017  Total Protein 6.5 - 8.1 g/dL - - 5.6(L)  Albumin 3.5 - 5.0 g/dL 2.2(L) 2.2(L) 2.3(L)  AST 15 - 41 U/L - - 110(H)  ALT 17 - 63 U/L - - 60  Alk Phosphatase 38 - 126 U/L - - 67  Total Bilirubin 0.3 - 1.2 mg/dL - - 0.9     STUDIES: Dg Abdomen 1 View  Result Date: 03/27/2017 CLINICAL DATA:  Encounter for orogastric tube placement. EXAM: ABDOMEN - 1 VIEW COMPARISON:  None. FINDINGS: The tip and side port of a gastric tube are seen in the expected location of the stomach. The included heart is enlarged. Pleural thickening and/or small loculated fluid is noted along the periphery the right hemithorax. Partially visualized distal aortic stent graft. Lumbar fusion hardware noted at L2-3. No radio-opaque calculi or other significant radiographic abnormality are seen. IMPRESSION: 1. Gastric tube in the expected location of the stomach. 2. Cardiomegaly with pleural thickening versus small loculated pleural fluid. Electronically Signed   By: Ashley Royalty M.D.   On: 03/02/2017 19:04   Dg Chest Port 1 View  Result Date: 03/04/2017 CLINICAL DATA:  Central line placement EXAM:  PORTABLE CHEST - 1 VIEW COMPARISON:  Earlier film of the same day FINDINGS: Left IJ HD catheter is been placed, tip near the junction of the left innominate vein to the SVC. No pneumothorax. Endotracheal tube and nasogastric tube remain in place. Left subclavian transvenous pacemaker incompletely visualized. Cervical fixation hardware noted. Coarse moderate interstitial airspace opacities throughout the visualized lungs. The bases are excluded. Visualized bones unremarkable. IMPRESSION: 1. Left IJ central line to the junction of left innominate vein to the SVC. No pneumothorax. Electronically Signed   By: Lucrezia Europe M.D.   On: 03/04/2017 10:12   Dg Chest Port 1 View  Result Date: 03/04/2017 CLINICAL DATA:  83 year old male with acute respiratory failure. EXAM: PORTABLE CHEST 1 VIEW COMPARISON:  Chest radiograph dated 03/24/2017 FINDINGS: The endotracheal tube remains above the carina in stable positioning. There is right-sided pleural effusion and right lung base atelectasis/ infiltrate. Diffuse interstitial coarsening and left lung base atelectatic changes. Pleural thickening or loculated fluid in the right upper lobe. Stable cardiac silhouette. Atherosclerotic calcification of the aortic arch. Left pectoral pacemaker device. IMPRESSION: Right-sided pleural effusion and right lung base atelectasis/ infiltrate. Overall no significant interval change in the appearance of the lungs compared to the earlier radiograph. Electronically Signed   By: Anner Crete M.D.   On: 03/04/2017 07:17   Dg Chest Portable 1 View  Result Date: 03/05/2017 CLINICAL DATA:  Patient found on floor by family in with agonal breathing. EXAM: PORTABLE CHEST 1 VIEW COMPARISON:  02/01/2017 FINDINGS: Cardiomegaly with aortic atherosclerosis. Endotracheal tube is noted 4.3 cm above the carina in satisfactory position. Pulmonary vascular redistribution is seen consistent with pulmonary edema. Superimposed airspace opacities in the  periphery of the right upper and central left upper lobes cannot concomitant pneumonia. No left effusion. The right lateral costophrenic angle is included as part of the abdomen radiographs acquired sequentially with this exam. There does appear to be pleural thickening or a small fluid loculation along the periphery of the right hemithorax. Spinal fusion hardware noted of the included lower cervical spine. External defibrillator paddles project over the cardiac silhouette. No acute osseous abnormality. IMPRESSION: 1. Cardiomegaly with aortic atherosclerosis. Pleural thickening versus small  amount of loculated fluid along the periphery of the visualized right hemithorax. 2. Satisfactory endotracheal tube position. 3. Diffuse pulmonary vascular redistribution consistent with pulmonary edema. 4. Superimposed pneumonia in the upper lobes is not excluded. Electronically Signed   By: Ashley Royalty M.D.   On: 03/22/2017 18:53      Impression / Plan:   Kyle Vasquez is a 83 y.o. male with CHF, A. fib on Coumadin, COPD admitted to ICU after cardiopulmonary arrest status post CPR, currently on hemodynamic support with 3 different vasopressors, on CVVHD. GI consulted for coffee-ground emesis, and anemia in the setting of supratherapeutic INR. No known cirrhosis.  There is no evidence of active GI bleed at present - Continue pantoprazole drip - Monitor CBC, PT/INR closely - Hold Coumadin - Already evaluated by cardiology, no intervention at this time - Patient is critically ill to undergo any kind of GI procedures at this time and he is not actively bleeding at present - Overall prognosis is guarded - Discuss my recommendations with patient's son  Thank you for involving me in the care of this patient.      LOS: 1 day   Sherri Sear, MD  03/04/2017, 4:08 PM   Note: This dictation was prepared with Dragon dictation along with smaller phrase technology. Any transcriptional errors that result from this  process are unintentional.

## 2017-03-04 NOTE — Progress Notes (Signed)
PULMONARY / CRITICAL CARE MEDICINE   Name: Kyle Vasquez MRN: [id number] DOB: [birth year]    ADMISSION DATE:  03/05/2017   CONSULTATION DATE:  03/17/2017  REFERRING MD:  Dr. Darvin Neighbours  REASON: Cardiac arrest  HISTORY OF PRESENT ILLNESS:   This is an 83 y/o male with a PMH as indicated below who presented to the ED via EMS following a cardiac arrest event at home. History is obtained from ED and EMS records as patient is currently intubated and sedated. Per ED records, patient was found on the floor by family. He had agonal respirations and proceeded to have a witnessed arrest. CPR was initiated and sustained for 6 minutes. Patient received 2 doses of epinephrine and a king airway was placed. Upon arrival in the ED, airway was replaced with an ETT. No shocks administered. His blood presure  And HR were stable upon arrival. His ED work-up revealed a Hg/HCT of 6.7/21.2, platelets of 77, INR of 7.16, PT 61, aPTT 80, Creatinine of 4.0 and a potassium of 6.2. He is being admitted to the ICU for further management  SUBJECTIVE: Hypotension with severe metabolic acidosis overnight.  At about 2:30 a.m., patient became severely hypotensive despite transfusion, pressors and IV fluids.  Mean arterial blood pressure dropped to 35.  Left femoral triple-lumen catheter and right femoral A-line placed emergently.  Norepinephrine dopamine and vasopressin added.  Repeat labs showed a hemoglobin of 6.9 after 2 units of packed red blood cells.  She now 2 units of packed red blood cells, 1 unit of fresh frozen plasma and 1 unit of need of platelets administered rapidly.  Mean arterial pressure improved to 60s and 70s.  Repeat ABG showed worsening catabolic acidosis.  Patient started on bicarb infusion.  ABG at 6 AM showed a pH of 7.2, PCO2 of 43, PO2 of 218, bicarb of 16.8 and an SPO2 of 99.6%.  Patient is maintained on 3 pressors, bicarb infusion, Protonix infusion, and blood products.  Patient son and covering attending  notified as well as [place]: BP (!) 91/55   Pulse 75   Temp 98.1 F (36.7 C)   Resp 20   Ht 5\' 6"  (1.676 m)   Wt 71.2 kg (156 lb 15.5 oz)   SpO2 100%   BMI 25.34 kg/m   HEMODYNAMICS: CVP:  [30 mmHg-42 mmHg] 31 mmHg  VENTILATOR SETTINGS: Vent Mode: PRVC FiO2 (%):  [40 %-100 %] 100 % Set Rate:  [15 bmp-20 bmp] 20 bmp Vt Set:  [600 mL] 600 mL PEEP:  [5 cmH20] 5 cmH20  INTAKE / OUTPUT: I/O last 3 completed shifts: In: 3546.9 [I.V.:1976.9; Blood:630; Other:40; IV Piggyback:900] Out: 65 [Urine:65]  PHYSICAL EXAMINATION: General:  Sedated, NAD Neuro: Unresponsive to voice touch and noxious stimulus HEENT: PERRLA, edentulous, neck is supple without JVD Cardiovascular: Irregular, S1/S2, no MRG, +2 pulses, no edema Lungs: Bilateral breath sounds, no wheezing Abdomen: non-distended, normal BS X4 Musculoskeletal:  +ROM, no deformities Skin:  Cold and clammy, pale  LABS:  BMET Recent Labs  Lab 03/02/2017 2320 03/04/17 0331 03/04/17 0608  NA 138 138 140  K 5.9* 5.8* 5.6*  CL 117* 118* 118*  CO2 15* 14* 17*  BUN 72* 68* 65*  CREATININE 3.76* 3.65* 3.69*  GLUCOSE 95 46* 41*    Electrolytes Recent Labs  Lab 03/19/2017 2138 02/28/2017 2320 03/04/17 0331 03/04/17 0608  CALCIUM  --  8.1* 7.9* 7.5*  MG 2.5* 2.4  --  2.1  PHOS 4.0 4.2  --  4.5    CBC Recent Labs  Lab 03/04/17 0102 03/04/17 0331 03/04/17 0608  WBC 1.9* 1.7* 2.2*  HGB 6.9* 6.6* 9.2*  HCT 20.9* 20.4* 27.5*  PLT 74* 70* 110*    Coag's Recent Labs  Lab 03/11/2017 1827 02/28/2017 2320 03/04/17 0332 03/04/17 0608  APTT 80*  --   --  48*  INR 7.16* 2.37 2.37 1.93    Sepsis Markers Recent Labs  Lab 03/17/2017 2138 03/04/17 0331 03/04/17 0608  LATICACIDVEN  --  2.9* 1.9  PROCALCITON 0.46 0.45  --     ABG Recent Labs  Lab 02/25/2017 1834 03/04/17 0349 03/04/17 0604  PHART 7.20* 7.13* 7.20*  PCO2ART 37 49* 43  PO2ART 144* 237* 218*    Liver Enzymes Recent Labs  Lab  03/22/2017 1827 03/04/17 0331 03/04/17 0608  AST 82* 91* 110*  ALT 52 51 60  ALKPHOS 73 61 67  BILITOT 0.5 0.7 0.9  ALBUMIN 2.6* 2.2* 2.3*    Cardiac Enzymes Recent Labs  Lab 03/14/2017 1827 03/04/17 0608  TROPONINI 0.03* 0.38*    Glucose Recent Labs  Lab 03/17/2017 2132 03/04/17 0724  GLUCAP 106* 118*    Imaging Dg Abdomen 1 View  Result Date: 02/26/2017 CLINICAL DATA:  Encounter for orogastric tube placement. EXAM: ABDOMEN - 1 VIEW COMPARISON:  None. FINDINGS: The tip and side port of a gastric tube are seen in the expected location of the stomach. The included heart is enlarged. Pleural thickening and/or small loculated fluid is noted along the periphery the right hemithorax. Partially visualized distal aortic stent graft. Lumbar fusion hardware noted at L2-3. No radio-opaque calculi or other significant radiographic abnormality are seen. IMPRESSION: 1. Gastric tube in the expected location of the stomach. 2. Cardiomegaly with pleural thickening versus small loculated pleural fluid. Electronically Signed   By: Ashley Royalty M.D.   On: 03/22/2017 19:04   Dg Chest Port 1 View  Result Date: 03/04/2017 CLINICAL DATA:  83 year old male with acute respiratory failure. EXAM: PORTABLE CHEST 1 VIEW COMPARISON:  Chest radiograph dated 03/07/2017 FINDINGS: The endotracheal tube remains above the carina in stable positioning. There is right-sided pleural effusion and right lung base atelectasis/ infiltrate. Diffuse interstitial coarsening and left lung base atelectatic changes. Pleural thickening or loculated fluid in the right upper lobe. Stable cardiac silhouette. Atherosclerotic calcification of the aortic arch. Left pectoral pacemaker device. IMPRESSION: Right-sided pleural effusion and right lung base atelectasis/ infiltrate. Overall no significant interval change in the appearance of the lungs compared to the earlier radiograph. Electronically Signed   By: Anner Crete M.D.   On:  03/04/2017 07:17   Dg Chest Portable 1 View  Result Date: 02/25/2017 CLINICAL DATA:  Patient found on floor by family in with agonal breathing. EXAM: PORTABLE CHEST 1 VIEW COMPARISON:  02/01/2017 FINDINGS: Cardiomegaly with aortic atherosclerosis. Endotracheal tube is noted 4.3 cm above the carina in satisfactory position. Pulmonary vascular redistribution is seen consistent with pulmonary edema. Superimposed airspace opacities in the periphery of the right upper and central left upper lobes cannot concomitant pneumonia. No left effusion. The right lateral costophrenic angle is included as part of the abdomen radiographs acquired sequentially with this exam. There does appear to be pleural thickening or a small fluid loculation along the periphery of the right hemithorax. Spinal fusion hardware noted of the included lower cervical spine. External defibrillator paddles project over the cardiac silhouette. No acute osseous abnormality. IMPRESSION: 1. Cardiomegaly with aortic atherosclerosis. Pleural thickening versus small amount of loculated fluid  along the periphery of the visualized right hemithorax. 2. Satisfactory endotracheal tube position. 3. Diffuse pulmonary vascular redistribution consistent with pulmonary edema. 4. Superimposed pneumonia in the upper lobes is not excluded. Electronically Signed   By: Ashley Royalty M.D.   On: 03/25/2017 18:53   STUDIES:  2-D echo pending  CULTURES: Blood cultures x 2 Urine culture Respiratory   ANTIBIOTICS: Ceftriaxone Azithromycin SIGNIFICANT EVENTS: 12/07>a=cardiac arrest>admitted  LINES/TUBES: 12/07 PIVs ETT Foley  DISCUSSION: 83 Y/O male presenting with cardiac arrest, cummunity acquired pneumonia, cardiogenic/hypovolemic shock, acute blood loss anemia, coumadin induced coagulopathy, acute on chronic renal failure, thrombocytopenia, neutropenia and hyperkalemia  ASSESSMENT / PLAN:  PULMONARY A: Acute respiratory failure secondary to cardiac  arrest ARDS  Acute pulmonary edema Community acquired pneumonia Severe metabolic acidosis P:   Full vent support with current settings VAP protocol Nebulized bronchodilators Stat ABG reviewed; vent changes made Abx for CAP as above Influenza vaccine prior to discharge if not taken yet Continue bicarb infusion and vent changes needed CARDIOVASCULAR A:  Cardiac arrest Cardiogenic/hypovolemic shock H/O Afib P:  Hemodynamic monitoring per ICU protocol CVP Q4H IV fluids HR control with prn metoprolol Vasopressin, norepinephrine, and phenylephrine infusions to maintain mean arterial blood pressure less than 65 Transfuse per protocol RENAL A:   Acute on chronic renal failure Hyperkalemia P:   IV fluids Monitor and correct electrolytes Nephrology consult Patient may require CRRT  GASTROINTESTINAL A:   Acute GI bleed P:   GI consulted; we will probably scope patient today Spoke with the on-call GI physician who recommended correcting INR to less than 2 Continue Protonix infusion Blood products as needed Follow-up repeat CBC CMP ABG, magnesium and phosphorus  HEMATOLOGIC A:   Acute blood loss anemia Coumadin induced coagulaopathy P:  Continue Kcentra DC Coumadin Trend PT/INR Transfuse blood products as needed CBC posttransfusion INFECTIOUS A:   CAP Sepsis-urinary versus pulmonary source P:   Antibiotics as above Follow-up cultures  ENDOCRINE A:   H/o Hypothyroidism   P:   Resume home medications Monitor blood glucose and treat hypoglycemia  NEUROLOGIC A:   Acute metabolic encephalopathy 2/2 sepsis and cardiac arrest P:   RASS goal: 0 to -1 Monitor neurological status Fentanyl and as needed Versed for vent sedation and discomfort  FAMILY  - Updates: Patient's son updated on current changes in patient's status. - Inter-disciplinary family meet or Palliative Care meeting due by:  day 7  Total CC time >120 minutes  Ayisha Pol S. [facility]  ANP-BC Pulmonary and [place] Pager [phone] or [phone] 03/04/2017, 8:16 AM

## 2017-03-04 NOTE — Progress Notes (Signed)
ANTICOAGULATION CONSULT NOTE - Initial Consult  Pharmacy Consult for [facility] Indication: elevated INR   No Known Allergies  Patient Measurements: Height: 5\' 6"  (167.6 cm) Weight: 156 lb 15.5 oz (71.2 kg) IBW/kg (Calculated) : 63.8 Heparin Dosing Weight:   Vital Signs: Temp: 98.1 F (36.7 C) (12/08 0800) Temp Source: Oral (12/07 2232) BP: 91/55 (12/08 0800) Pulse Rate: 75 (12/08 0800)  Labs: Recent Labs    03/15/2017 1827 02/28/2017 2320 03/04/17 0102 03/04/17 0331 03/04/17 0332 03/04/17 0608  HGB 6.7*  --  6.9* 6.6*  --  9.2*  HCT 21.2*  --  20.9* 20.4*  --  27.5*  PLT 77*  --  74* 70*  --  110*  APTT 80*  --   --   --   --  48*  LABPROT 61.0* 25.7*  --   --  25.7* 21.9*  INR 7.16* 2.37  --   --  2.37 1.93  CREATININE 4.01* 3.76*  --  3.65*  --  3.69*  TROPONINI 0.03*  --   --   --   --  0.38*    Estimated Creatinine Clearance: 13.7 mL/min (A) (by C-G formula based on SCr of 3.69 mg/dL (H)).   Medical History: Past Medical History:  Diagnosis Date  . Anemia   . Atrial fibrillation ([place])   . Bradycardia   . Chronic kidney disease   . COPD (chronic obstructive pulmonary disease) ([place])   . Coronary artery disease   . Depression   . Heart murmur   . HLD (hyperlipidemia)   . Hypertension   . Hypothyroidism   . Lumbar myelopathy ([place])   . MDS (myelodysplastic syndrome) ([place])   . OSA (obstructive sleep apnea)   . Stroke [facility])    left sided weakness  . Syncope     Medications:  Medications Prior to Admission  Medication Sig Dispense Refill Last Dose  . acetaminophen (TYLENOL) 325 MG tablet Take 1 tablet (325 mg total) by mouth every 6 (six) hours as needed for mild pain.   prn at prn  . albuterol (PROVENTIL HFA;VENTOLIN HFA) 108 (90 Base) MCG/ACT inhaler Inhale 2 puffs into the lungs every 6 (six) hours as needed for wheezing or shortness of breath.   prn at prn  . amLODipine (NORVASC) 2.5 MG tablet Take 1 tablet by mouth daily.     Marland Kitchen amLODipine (NORVASC) 5  MG tablet Take 1 tablet (5 mg total) by mouth daily. 30 tablet 0 01/31/2017 at 0800  . aspirin EC 81 MG tablet Take 81 mg by mouth daily.   01/31/2017 at 0800  . azithromycin (ZITHROMAX) 250 MG tablet 250mg  once daily for 4 more days 4 each 0   . cyanocobalamin 500 MCG tablet Take 500 mcg by mouth 2 (two) times daily.   01/31/2017 at 1800  . docusate sodium (COLACE) 100 MG capsule Take 1 capsule (100 mg total) by mouth 2 (two) times daily as needed for mild constipation. 10 capsule 0 01/31/2017 at 1800  . Fluticasone-Salmeterol (ADVAIR) 250-50 MCG/DOSE AEPB Inhale 1 puff into the lungs 2 (two) times daily.   01/31/2017 at 1800  . HYDROcodone-acetaminophen (NORCO/VICODIN) 5-325 MG tablet Take 1-2 tablets by mouth every 4 (four) hours as needed for moderate pain. (Patient not taking: Reported on 01/27/2017) 30 tablet 0 prn at prn  . levothyroxine (SYNTHROID, LEVOTHROID) 25 MCG tablet Take 1 tablet (25 mcg total) by mouth daily before breakfast. 30 tablet 0 01/31/2017 at 0800  . Multiple Vitamins-Minerals (MULTIVITAMIN WITH  MINERALS) tablet Take 1 tablet by mouth daily.   01/31/2017 at 0800  . rosuvastatin (CRESTOR) 5 MG tablet Take 1 tablet (5 mg total) by mouth daily at 6 PM. 30 tablet  01/31/2017 at 1800  . tamsulosin (FLOMAX) 0.4 MG CAPS capsule Take 1 capsule (0.4 mg total) by mouth daily. 30 capsule 0 01/31/2017 at 0800  . warfarin (COUMADIN) 3 MG tablet Take 3 mg by mouth daily. Take as directed by anticoagulation clinic   01/31/2017 at 0800    Assessment: Pharmacy consulted to dose/monitor Kyle Vasquez in this 83 year old male s/p cardiac arrest .  Pt had cardiac arrest secondary to hypotension due to GI bleed.   Pt was on warfarin at home ;  12/07 INR = 7.16  Goal of Therapy:  INR < 2    Plan:  Will order INR 1 hr post KCentra administration and then check INR Q6H X 4 and then check daily X 2 .   12/8 @ 06:08  INR = 1.93  Amaree Leeper K, RPH 03/04/2017,8:19 AM

## 2017-03-04 NOTE — Progress Notes (Signed)
[facility], [place] 03/04/17  Subjective:   Patient is known to our practice from outpatient.  He was followed for chronic kidney disease.  He was last seen in October.More lately, his serum creatinine has been 3.8-4.0 range, GFR 15 Patient's son states that patient has been sick for the last few days.  His health has been going down over the last few weeks.  He has been coughing.  Over the last 2 days he had difficulty breathing.  EMS was called the day prior to admission.  His vitals were okay therefore they referred him to primary care doctor.  Patient got sick the next day.  His son reports that he had to call EMS back.  They performed CPR for a cardiac arrest. Patient is now currently intubated and is requiring ventilator support.  He is on multiple pressors.  His chest x-ray shows right-sided pleural effusion and right lung base infiltrate  Admission creatinine was 4.01, today's creatinine is 3.69 with potassium of 5.6 Patient is oliguric.  Nephrology consult has been requested to evaluate for dialysis  Objective:  Vital signs in last 24 hours:  Temp:  [85.1 F (29.5 C)-98.1 F (36.7 C)] 97.7 F (36.5 C) (12/08 0830) Pulse Rate:  [26-111] 83 (12/08 0830) Resp:  [0-23] 20 (12/08 0830) BP: (56-156)/(44-89) 100/58 (12/08 0830) SpO2:  [86 %-100 %] 92 % (12/08 0830) Arterial Line BP: (64-179)/(37-98) 105/51 (12/08 0830) FiO2 (%):  [40 %-100 %] 50 % (12/08 0812) Weight:  [71.2 kg (156 lb 15.5 oz)-81.5 kg (179 lb 10.8 oz)] 71.2 kg (156 lb 15.5 oz) (12/07 2232)  Weight change:  Filed Weights   03/13/2017 1822 03/05/2017 2130 03/22/2017 2232  Weight: 72.1 kg (159 lb) 81.5 kg (179 lb 10.8 oz) 71.2 kg (156 lb 15.5 oz)    Intake/Output:    Intake/Output Summary (Last 24 hours) at 03/04/2017 0846 Last data filed at 03/04/2017 0730 Gross per 24 hour  Intake 3745.37 ml  Output 65 ml  Net 3680.37 ml     Physical Exam: General:  Critically ill appearing  HEENT   ET tube, OG tube in place  Neck  no masses   Pulm/lungs  ventilator assisted  CVS/Heart  regular rhythm, pacer spikes seen  Abdomen:   Doughy consistency, decreased breath sounds  Extremities:  Some dependent peripheral edema  Neurologic:  Sedated  Skin:  No acute rashes  Dialysis Access:  To be placed       Basic Metabolic Panel:  Recent Labs  Lab 03/17/2017 1827 03/24/2017 2138 03/08/2017 2320 03/04/17 0331 03/04/17 0608  NA 137  --  138 138 140  K 6.2*  --  5.9* 5.8* 5.6*  CL 115*  --  117* 118* 118*  CO2 16*  --  15* 14* 17*  GLUCOSE 134*  --  95 46* 41*  BUN 73*  --  72* 68* 65*  CREATININE 4.01*  --  3.76* 3.65* 3.69*  CALCIUM 8.9  --  8.1* 7.9* 7.5*  MG  --  2.5* 2.4  --  2.1  PHOS  --  4.0 4.2  --  4.5     CBC: Recent Labs  Lab 02/26/2017 1827 03/04/17 0102 03/04/17 0331 03/04/17 0608  WBC 2.2* 1.9* 1.7* 2.2*  NEUTROABS 1.9  --   --   --   HGB 6.7* 6.9* 6.6* 9.2*  HCT 21.2* 20.9* 20.4* 27.5*  MCV 96.4 94.0 96.7 91.8  PLT 77* 74* 70* 110*  No results found for: HEPBSAG, HEPBSAB, HEPBIGM    Microbiology:  Recent Results (from the past 240 hour(s))  MRSA PCR Screening     Status: None   Collection Time: 03/06/2017  9:15 PM  Result Value Ref Range Status   MRSA by PCR NEGATIVE NEGATIVE Final    Comment:        The GeneXpert MRSA Assay (FDA approved for NASAL specimens only), is one component of a comprehensive MRSA colonization surveillance program. It is not intended to diagnose MRSA infection nor to guide or monitor treatment for MRSA infections.   Culture, blood (Routine X 2) w Reflex to ID Panel     Status: None (Preliminary result)   Collection Time: 03/05/2017  9:39 PM  Result Value Ref Range Status   Specimen Description BLOOD LEFT HAND  Final   Special Requests   Final    BOTTLES DRAWN AEROBIC AND ANAEROBIC Blood Culture adequate volume   Culture NO GROWTH < 12 HOURS  Final   Report Status PENDING  Incomplete  Culture, blood (Routine X  2) w Reflex to ID Panel     Status: None (Preliminary result)   Collection Time: 03/21/2017  9:43 PM  Result Value Ref Range Status   Specimen Description BLOOD RIGHT HAND  Final   Special Requests   Final    BOTTLES DRAWN AEROBIC AND ANAEROBIC Blood Culture adequate volume   Culture NO GROWTH < 12 HOURS  Final   Report Status PENDING  Incomplete    Coagulation Studies: Recent Labs    03/05/2017 1827 03/02/2017 2320 03/04/17 0332 03/04/17 0608  LABPROT 61.0* 25.7* 25.7* 21.9*  INR 7.16* 2.37 2.37 1.93    Urinalysis: No results for input(s): COLORURINE, LABSPEC, PHURINE, GLUCOSEU, HGBUR, BILIRUBINUR, KETONESUR, PROTEINUR, UROBILINOGEN, NITRITE, LEUKOCYTESUR in the last 72 hours.  Invalid input(s): APPERANCEUR    Imaging: Dg Abdomen 1 View  Result Date: 03/25/2017 CLINICAL DATA:  Encounter for orogastric tube placement. EXAM: ABDOMEN - 1 VIEW COMPARISON:  None. FINDINGS: The tip and side port of a gastric tube are seen in the expected location of the stomach. The included heart is enlarged. Pleural thickening and/or small loculated fluid is noted along the periphery the right hemithorax. Partially visualized distal aortic stent graft. Lumbar fusion hardware noted at L2-3. No radio-opaque calculi or other significant radiographic abnormality are seen. IMPRESSION: 1. Gastric tube in the expected location of the stomach. 2. Cardiomegaly with pleural thickening versus small loculated pleural fluid. Electronically Signed   By: Ashley Royalty M.D.   On: 03/21/2017 19:04   Dg Chest Port 1 View  Result Date: 03/04/2017 CLINICAL DATA:  83 year old male with acute respiratory failure. EXAM: PORTABLE CHEST 1 VIEW COMPARISON:  Chest radiograph dated 03/21/2017 FINDINGS: The endotracheal tube remains above the carina in stable positioning. There is right-sided pleural effusion and right lung base atelectasis/ infiltrate. Diffuse interstitial coarsening and left lung base atelectatic changes. Pleural  thickening or loculated fluid in the right upper lobe. Stable cardiac silhouette. Atherosclerotic calcification of the aortic arch. Left pectoral pacemaker device. IMPRESSION: Right-sided pleural effusion and right lung base atelectasis/ infiltrate. Overall no significant interval change in the appearance of the lungs compared to the earlier radiograph. Electronically Signed   By: Anner Crete M.D.   On: 03/04/2017 07:17   Dg Chest Portable 1 View  Result Date: 03/18/2017 CLINICAL DATA:  Patient found on floor by family in with agonal breathing. EXAM: PORTABLE CHEST 1 VIEW COMPARISON:  02/01/2017 FINDINGS: Cardiomegaly with aortic  atherosclerosis. Endotracheal tube is noted 4.3 cm above the carina in satisfactory position. Pulmonary vascular redistribution is seen consistent with pulmonary edema. Superimposed airspace opacities in the periphery of the right upper and central left upper lobes cannot concomitant pneumonia. No left effusion. The right lateral costophrenic angle is included as part of the abdomen radiographs acquired sequentially with this exam. There does appear to be pleural thickening or a small fluid loculation along the periphery of the right hemithorax. Spinal fusion hardware noted of the included lower cervical spine. External defibrillator paddles project over the cardiac silhouette. No acute osseous abnormality. IMPRESSION: 1. Cardiomegaly with aortic atherosclerosis. Pleural thickening versus small amount of loculated fluid along the periphery of the visualized right hemithorax. 2. Satisfactory endotracheal tube position. 3. Diffuse pulmonary vascular redistribution consistent with pulmonary edema. 4. Superimposed pneumonia in the upper lobes is not excluded. Electronically Signed   By: Ashley Royalty M.D.   On: 03/24/2017 18:53     Medications:   . dextrose 50 mL/hr at 03/04/17 0730  . fentaNYL infusion INTRAVENOUS 200 mcg/hr (03/04/17 0807)  . norepinephrine (LEVOPHED) Adult  infusion 29.973 mcg/min (03/04/17 0730)  . pantoprozole (PROTONIX) infusion 8 mg/hr (03/04/17 0843)  . phenylephrine (NEO-SYNEPHRINE) Adult infusion 400 mcg/min (03/04/17 0845)  .  sodium bicarbonate  infusion 1000 mL 125 mL/hr at 03/04/17 0730  . vasopressin (PITRESSIN) infusion - *FOR SHOCK* 0.03 Units/min (03/04/17 0730)   . chlorhexidine gluconate (MEDLINE KIT)  15 mL Mouth Rinse BID  . fentaNYL      . mouth rinse  15 mL Mouth Rinse 10 times per day  . midazolam      . sodium chloride flush  3 mL Intravenous Q12H   acetaminophen **OR** acetaminophen, albuterol, bisacodyl, fentaNYL, midazolam, midazolam, ondansetron **OR** ondansetron (ZOFRAN) IV, polyethylene glycol, sennosides  Assessment/ Plan:  83 y.o. African American male with Atrial fibrillation, sick sinus syndrome, Pacemaker placement 06/2016, hypertension, hypothyroidism, myelodysplastic syndrome, obstructive sleep apnea, stroke with left-sided weakness, history of syncope, chronic kidney disease stage IV  1.  ARF on CKD st 4 2.  Acute Resp Failure. ARDS 3.  Hyperkalemia 4.  Lactic Acidosis 5.  Coagulopathy  Patient is critically ill.  He has developed oliguric acute renal failure likely due to sepsis and pneumonia.  He has underlying chronic kidney disease stage IV.  His baseline creatinine is 3.8/GFR 15.  Previously an outpatient, according to our discussions, patient's and son and wife had decided for non-dialytic conservative management.  Patient's son at this time wants to try short-term dialysis to see if his illness can be reversed .  We discussed that it is possible that he may recover from pneumonia but might end up on permanent dialysis.  Family understands that.  They want to continue to try aggressive measures  Plan: Start continuous renal replacement therapy.  ICU team will place the dialysis catheter. We will use 2K bath.  Ultrafiltration @ 50 cc an hour. Monitor Closely.   LOS: [place] 12/8/20188:46  AM  [facility] [place], [place]

## 2017-03-04 NOTE — Progress Notes (Signed)
Informed NP that patient's cbg was 62. Patient on D10 at 24. Got order changed to D10 at 75 and recheck in 1 hour and then use hypogylcemic protocol if still low. After 1 hour at 17 patient cbg was still 62. D50 administered and rechecked in 15 minutes. CBG up to 104. Will resume q 4 hour checks and medicate as needed at that time. Both peripheral and arterial line read the same thing.

## 2017-03-04 NOTE — Progress Notes (Signed)
Pharmacy Antibiotic Note  Kyle Vasquez is a 83 y.o. male admitted on 03/18/2017 with pneumonia.  Pharmacy has been consulted for zosyn and vancomycin dosing.  Plan: Vancomycin 1000mg  IV every 48 hours.  Goal trough 15-20 mcg/mL. Zosyn 3.375g IV q8h (4 hour infusion). Patient is on continuous venovenous ultrafiltration, zosyn currently dosed 3.375gm iv q8h, with vancomycin at 1gm iv q48h. These will likely need to be adjusted should the renal therapy change. Please monitor closely  Height: 5\' 6"  (167.6 cm) Weight: 156 lb 15.5 oz (71.2 kg) IBW/kg (Calculated) : 63.8  Temp (24hrs), Avg:91 F (32.8 C), Min:85.1 F (29.5 C), Max:98.1 F (36.7 C)  Recent Labs  Lab 03/07/2017 1827 03/05/2017 2320 03/04/17 0102 03/04/17 0331 03/04/17 0608 03/04/17 1048  WBC 2.2*  --  1.9* 1.7* 2.2*  --   CREATININE 4.01* 3.76*  --  3.65* 3.69* 3.33*  LATICACIDVEN  --   --   --  2.9* 1.9  --     Estimated Creatinine Clearance: 15.2 mL/min (A) (by C-G formula based on SCr of 3.33 mg/dL (H)).    No Known Allergies  Antimicrobials this admission: Anti-infectives (From admission, onward)   Start     Dose/Rate Route Frequency Ordered Stop   03/04/17 1800  azithromycin (ZITHROMAX) 500 mg in dextrose 5 % 250 mL IVPB  Status:  Discontinued     500 mg 250 mL/hr over 60 Minutes Intravenous Every 24 hours 03/23/2017 2201 03/04/17 0830   03/04/17 1600  vancomycin (VANCOCIN) IVPB 1000 mg/200 mL premix     1,000 mg 200 mL/hr over 60 Minutes Intravenous Every 48 hours 03/04/17 1109     03/04/17 1115  piperacillin-tazobactam (ZOSYN) IVPB 3.375 g     3.375 g 12.5 mL/hr over 240 Minutes Intravenous Every 8 hours 03/04/17 1112     03/04/17 1000  piperacillin-tazobactam (ZOSYN) IVPB 3.375 g  Status:  Discontinued     3.375 g 12.5 mL/hr over 240 Minutes Intravenous Every 12 hours 03/04/17 0941 03/04/17 1112   03/04/17 0945  vancomycin (VANCOCIN) IVPB 1000 mg/200 mL premix     1,000 mg 200 mL/hr over 60 Minutes  Intravenous  Once 03/04/17 0942     02/26/2017 2230  cefTRIAXone (ROCEPHIN) 1 g in dextrose 5 % 50 mL IVPB  Status:  Discontinued     1 g 100 mL/hr over 30 Minutes Intravenous Every 24 hours 03/19/2017 2200 03/04/17 0830   03/04/2017 1915  levofloxacin (LEVAQUIN) IVPB 750 mg     750 mg 100 mL/hr over 90 Minutes Intravenous  Once 03/18/2017 1908 03/06/2017 2212       Dose adjustments this admission: Patient is on continuous venovenous ultrafiltration, zosyn currently dosed 3.375gm iv q8h, with vancomycin at 1gm iv q48h. These will likely need to be adjusted should the renal therapy change. Please monitor closely   Microbiology results: Recent Results (from the past 240 hour(s))  MRSA PCR Screening     Status: None   Collection Time: 03/20/2017  9:15 PM  Result Value Ref Range Status   MRSA by PCR NEGATIVE NEGATIVE Final    Comment:        The GeneXpert MRSA Assay (FDA approved for NASAL specimens only), is one component of a comprehensive MRSA colonization surveillance program. It is not intended to diagnose MRSA infection nor to guide or monitor treatment for MRSA infections.   Culture, blood (Routine X 2) w Reflex to ID Panel     Status: None (Preliminary result)   Collection Time: 03/26/2017  9:39 PM  Result Value Ref Range Status   Specimen Description BLOOD LEFT HAND  Final   Special Requests   Final    BOTTLES DRAWN AEROBIC AND ANAEROBIC Blood Culture adequate volume   Culture NO GROWTH < 12 HOURS  Final   Report Status PENDING  Incomplete  Culture, blood (Routine X 2) w Reflex to ID Panel     Status: None (Preliminary result)   Collection Time: 03/07/2017  9:43 PM  Result Value Ref Range Status   Specimen Description BLOOD RIGHT HAND  Final   Special Requests   Final    BOTTLES DRAWN AEROBIC AND ANAEROBIC Blood Culture adequate volume   Culture NO GROWTH < 12 HOURS  Final   Report Status PENDING  Incomplete     Thank you for allowing pharmacy to be a part of this patient's  care.  Donna Christen Shatasia Cutshaw 03/04/2017 11:25 AM

## 2017-03-04 NOTE — Progress Notes (Signed)
Patient arrived to unit at approximately 2130. Dopamine, which was started in the ER, was changed to Neo and after Neo was maxed at 445mcg/min Levo was added. NP inserted an A-line to the right femoral area and a triple lumen to the left femoral area. CVP and A-line reading obtained. At approximately 0330 the patient's blood pressure was still not responding so an "unofficial code" was started. Patient received 2 PRBCs, 1 FFP and 1 platelet during the code. Patient's blood pressure recovered with multiple pressors and fluid boluses. Report given to oncoming shift.

## 2017-03-04 NOTE — Progress Notes (Signed)
Per Abg results increased RR 20 per NP Tukov at bedside

## 2017-03-04 NOTE — Progress Notes (Signed)
Pulmonary/critical care  Family discussion Spoke with son Discussed with him CODE STATUS in case Kyle Vasquez suffers an additional cardiac arrest. He states he would not want CPR to be performed. Will make patient DO NOT RESUSCITATE but continue full aggressive supportive care.  Hermelinda Dellen, D.O.

## 2017-03-04 NOTE — Progress Notes (Signed)
UNMATCHED BLOOD PRODUCT NOTE  Compare the patient ID on the blood tag to the patient ID on the hospital armband and Blood Bank armband. Then confirm the unit number on the blood tag matches the unit number on the blood product.  If a discrepancy is discovered return the product to blood bank immediately.   Blood Product Type: Platelets (Pheresed)   Start [id number]  Starting Rate: 999  Stop [id number]:[id number]   All Other Documentation should be documented within the Blood Admin Flowsheet per policy.  This product was given during a code. Unable to scan product immediately when given. No signs and/or symptoms of reaction noted.

## 2017-03-04 NOTE — Procedures (Signed)
Central Venous Catheter Insertion Procedure Note Kyle Vasquez [id number] February 20, 1932  Procedure: Insertion of Central Venous Catheter Indications: Assessment of intravascular volume, Drug and/or fluid administration and Frequent blood sampling  Procedure Details Consent: Unable to obtain consent because of emergent medical necessity. Time Out: Verified patient identification, verified procedure, site/side was marked, verified correct patient position, special equipment/implants available, medications/allergies/relevent history reviewed, required imaging and test results available.  Performed  Maximum sterile technique was used including antiseptics, cap, gloves, gown, hand hygiene, mask and sheet. Skin prep: Chlorhexidine; local anesthetic administered A antimicrobial bonded/coated triple lumen catheter was placed in the left femoral vein due to emergent situation and elevated INR using the Seldinger technique.  Evaluation Blood flow good Complications: No apparent complications Patient did tolerate procedure well. Chest X-ray ordered to verify placement.  CXR: not indicated for femoral lines.  Procedure performed under direct supervision of Dr.Conforti. Ultrasound utilized for realtime vessel cannulation  [name] [facility] ANP-BC Pulmonary and [place] Pager [phone] or [phone] 03/04/2017, 8:30 AM

## 2017-03-04 NOTE — Progress Notes (Signed)
CDS notified of GCS less than 5 [phone] Spoke with Saralyn Pilar

## 2017-03-05 ENCOUNTER — Inpatient Hospital Stay: Payer: Medicare HMO

## 2017-03-05 LAB — RENAL FUNCTION PANEL
ALBUMIN: 1.8 g/dL — AB (ref 3.5–5.0)
ALBUMIN: 1.9 g/dL — AB (ref 3.5–5.0)
ANION GAP: 7 (ref 5–15)
ANION GAP: 8 (ref 5–15)
ANION GAP: 8 (ref 5–15)
Albumin: 1.9 g/dL — ABNORMAL LOW (ref 3.5–5.0)
Albumin: 1.9 g/dL — ABNORMAL LOW (ref 3.5–5.0)
Anion gap: 8 (ref 5–15)
BUN: 33 mg/dL — ABNORMAL HIGH (ref 6–20)
BUN: 28 mg/dL — ABNORMAL HIGH (ref 6–20)
BUN: 24 mg/dL — ABNORMAL HIGH (ref 6–20)
BUN: 21 mg/dL — ABNORMAL HIGH (ref 6–20)
CALCIUM: 6.7 mg/dL — AB (ref 8.9–10.3)
CALCIUM: 7 mg/dL — AB (ref 8.9–10.3)
CALCIUM: 7 mg/dL — AB (ref 8.9–10.3)
CHLORIDE: 102 mmol/L (ref 101–111)
CHLORIDE: 100 mmol/L — AB (ref 101–111)
CO2: 21 mmol/L — ABNORMAL LOW (ref 22–32)
CO2: 23 mmol/L (ref 22–32)
CO2: 22 mmol/L (ref 22–32)
CO2: 21 mmol/L — ABNORMAL LOW (ref 22–32)
CREATININE: 2.13 mg/dL — AB (ref 0.61–1.24)
CREATININE: 1.9 mg/dL — AB (ref 0.61–1.24)
CREATININE: 1.59 mg/dL — AB (ref 0.61–1.24)
Calcium: 7 mg/dL — ABNORMAL LOW (ref 8.9–10.3)
Chloride: 105 mmol/L (ref 101–111)
Chloride: 101 mmol/L (ref 101–111)
Creatinine, Ser: 1.55 mg/dL — ABNORMAL HIGH (ref 0.61–1.24)
GFR calc Af Amer: 46 mL/min — ABNORMAL LOW (ref >60)
GFR calc non Af Amer: 27 mL/min — ABNORMAL LOW (ref >60)
GFR calc non Af Amer: 31 mL/min — ABNORMAL LOW (ref >60)
GFR calc non Af Amer: 40 mL/min — ABNORMAL LOW (ref >60)
GFR, EST AFRICAN AMERICAN: 31 mL/min — AB (ref >60)
GFR, EST AFRICAN AMERICAN: 36 mL/min — AB (ref >60)
GFR, EST AFRICAN AMERICAN: 45 mL/min — AB (ref >60)
GFR, EST NON AFRICAN AMERICAN: 38 mL/min — AB (ref >60)
GLUCOSE: 102 mg/dL — AB (ref 65–99)
GLUCOSE: 100 mg/dL — AB (ref 65–99)
Glucose, Bld: 213 mg/dL — ABNORMAL HIGH (ref 65–99)
Glucose, Bld: 92 mg/dL (ref 65–99)
PHOSPHORUS: 2.2 mg/dL — AB (ref 2.5–4.6)
PHOSPHORUS: 2.4 mg/dL — AB (ref 2.5–4.6)
POTASSIUM: 3.9 mmol/L (ref 3.5–5.1)
Phosphorus: 2.2 mg/dL — ABNORMAL LOW (ref 2.5–4.6)
Phosphorus: 2.6 mg/dL (ref 2.5–4.6)
Potassium: 3.7 mmol/L (ref 3.5–5.1)
Potassium: 3.9 mmol/L (ref 3.5–5.1)
Potassium: 4.1 mmol/L (ref 3.5–5.1)
SODIUM: 133 mmol/L — AB (ref 135–145)
SODIUM: 133 mmol/L — AB (ref 135–145)
SODIUM: 131 mmol/L — AB (ref 135–145)
Sodium: 129 mmol/L — ABNORMAL LOW (ref 135–145)

## 2017-03-05 LAB — TYPE AND SCREEN
ABO/RH(D): A POS
ANTIBODY SCREEN: NEGATIVE
UNIT DIVISION: 0
UNIT DIVISION: 0
Unit division: 0
Unit division: 0

## 2017-03-05 LAB — GLUCOSE, CAPILLARY
GLUCOSE-CAPILLARY: 131 mg/dL — AB (ref 65–99)
GLUCOSE-CAPILLARY: 111 mg/dL — AB (ref 65–99)
GLUCOSE-CAPILLARY: 33 mg/dL — AB (ref 65–99)
GLUCOSE-CAPILLARY: 47 mg/dL — AB (ref 65–99)
GLUCOSE-CAPILLARY: 96 mg/dL (ref 65–99)
GLUCOSE-CAPILLARY: 63 mg/dL — AB (ref 65–99)
GLUCOSE-CAPILLARY: 65 mg/dL (ref 65–99)
GLUCOSE-CAPILLARY: 94 mg/dL (ref 65–99)
GLUCOSE-CAPILLARY: 87 mg/dL (ref 65–99)
Glucose-Capillary: 121 mg/dL — ABNORMAL HIGH (ref 65–99)
Glucose-Capillary: 202 mg/dL — ABNORMAL HIGH (ref 65–99)
Glucose-Capillary: 29 mg/dL — CL (ref 65–99)
Glucose-Capillary: 50 mg/dL — ABNORMAL LOW (ref 65–99)
Glucose-Capillary: 62 mg/dL — ABNORMAL LOW (ref 65–99)
Glucose-Capillary: 79 mg/dL (ref 65–99)
Glucose-Capillary: 84 mg/dL (ref 65–99)
Glucose-Capillary: 85 mg/dL (ref 65–99)
Glucose-Capillary: 86 mg/dL (ref 65–99)

## 2017-03-05 LAB — PREPARE FRESH FROZEN PLASMA: Unit division: 0

## 2017-03-05 LAB — MAGNESIUM: Magnesium: 1.5 mg/dL — ABNORMAL LOW (ref 1.7–2.4)

## 2017-03-05 LAB — BPAM RBC
BLOOD PRODUCT EXPIRATION DATE: 201812142359
BLOOD PRODUCT EXPIRATION DATE: 201812202359
BLOOD PRODUCT EXPIRATION DATE: 201812242359
Blood Product Expiration Date: 201812202359
ISSUE DATE / TIME: 201812071854
ISSUE DATE / TIME: 201812080325
ISSUE DATE / TIME: 201812071854
ISSUE DATE / TIME: 201812080341
UNIT TYPE AND RH: 5100
UNIT TYPE AND RH: 5100
UNIT TYPE AND RH: 6200
Unit Type and Rh: 6200

## 2017-03-05 LAB — CBC
HEMATOCRIT: 25.6 % — AB (ref 40.0–52.0)
Hemoglobin: 8.4 g/dL — ABNORMAL LOW (ref 13.0–18.0)
MCH: 30 pg (ref 26.0–34.0)
MCHC: 32.8 g/dL (ref 32.0–36.0)
MCV: 91.5 fL (ref 80.0–100.0)
PLATELETS: 82 10*3/uL — AB (ref 150–440)
RBC: 2.8 MIL/uL — AB (ref 4.40–5.90)
RDW: 18.4 % — ABNORMAL HIGH (ref 11.5–14.5)
WBC: 2.7 10*3/uL — ABNORMAL LOW (ref 3.8–10.6)

## 2017-03-05 LAB — BPAM FFP
BLOOD PRODUCT EXPIRATION DATE: 201812132359
ISSUE DATE / TIME: 201812080303
UNIT TYPE AND RH: 6200

## 2017-03-05 LAB — BPAM PLATELET PHERESIS
Blood Product Expiration Date: 201812092359
ISSUE DATE / TIME: 201812080413
Unit Type and Rh: 5100

## 2017-03-05 LAB — APTT: aPTT: 53 seconds — ABNORMAL HIGH (ref 24–36)

## 2017-03-05 LAB — FIBRINOGEN: FIBRINOGEN: 444 mg/dL (ref 210–475)

## 2017-03-05 LAB — PREPARE PLATELET PHERESIS: UNIT DIVISION: 0

## 2017-03-05 LAB — BLOOD GAS, ARTERIAL
Acid-base deficit: 2.7 mmol/L — ABNORMAL HIGH (ref 0.0–2.0)
Bicarbonate: 25.1 mmol/L (ref 20.0–28.0)
FIO2: 0.5
LHR: 20 {breaths}/min
O2 Saturation: 73.6 %
PEEP/CPAP: 5 cmH2O
PH ART: 7.23 — AB (ref 7.350–7.450)
Patient temperature: 37
VT: 600 mL
pCO2 arterial: 60 mmHg — ABNORMAL HIGH (ref 32.0–48.0)
pO2, Arterial: 47 mmHg — ABNORMAL LOW (ref 83.0–108.0)

## 2017-03-05 LAB — PROTIME-INR
INR: 1.77
INR: 1.65
INR: 1.65
Prothrombin Time: 20.5 seconds — ABNORMAL HIGH (ref 11.4–15.2)
Prothrombin Time: 19.4 seconds — ABNORMAL HIGH (ref 11.4–15.2)
Prothrombin Time: 19.4 seconds — ABNORMAL HIGH (ref 11.4–15.2)

## 2017-03-05 LAB — PROCALCITONIN: Procalcitonin: 8.53 ng/mL

## 2017-03-05 LAB — FIBRIN DERIVATIVES D-DIMER (ARMC ONLY): Fibrin derivatives D-dimer (ARMC): >7500 ng/mL (FEU) — ABNORMAL HIGH (ref 0.00–499.00)

## 2017-03-05 MED ORDER — DEXTROSE 50 % IV SOLN
1.0000 | Freq: Once | INTRAVENOUS | Status: AC
  Administered 2017-03-05: 50 mL via INTRAVENOUS

## 2017-03-05 MED ORDER — DEXTROSE 50 % IV SOLN
1.0000 | Freq: Once | INTRAVENOUS | Status: AC
  Administered 2017-03-05: 50 mL via INTRAVENOUS
  Filled 2017-03-05: qty 50

## 2017-03-05 MED ORDER — DEXTROSE 50 % IV SOLN
INTRAVENOUS | Status: AC
  Filled 2017-03-05: qty 50

## 2017-03-05 MED ORDER — DEXTROSE 50 % IV SOLN
INTRAVENOUS | Status: AC
  Administered 2017-03-05: 50 mL via INTRAVENOUS
  Filled 2017-03-05: qty 50

## 2017-03-05 MED ORDER — MAGNESIUM SULFATE 2 GM/50ML IV SOLN
2.0000 g | Freq: Once | INTRAVENOUS | Status: AC
  Administered 2017-03-05: 2 g via INTRAVENOUS
  Filled 2017-03-05: qty 50

## 2017-03-05 MED ORDER — HYDROCORTISONE NA SUCCINATE PF 100 MG IJ SOLR
100.0000 mg | Freq: Once | INTRAMUSCULAR | Status: AC
  Administered 2017-03-05: 100 mg via INTRAVENOUS
  Filled 2017-03-05: qty 2

## 2017-03-05 MED ORDER — DEXTROSE 50 % IV SOLN
50.0000 mL | Freq: Once | INTRAVENOUS | Status: AC
  Administered 2017-03-05: 50 mL via INTRAVENOUS

## 2017-03-05 MED ORDER — DEXTROSE 50 % IV SOLN: 1.0000 | Freq: Once | INTRAVENOUS | Status: AC

## 2017-03-05 MED ORDER — POTASSIUM PHOSPHATES 15 MMOLE/5ML IV SOLN
20.0000 mmol | Freq: Once | INTRAVENOUS | Status: AC
  Administered 2017-03-05: 20 mmol via INTRAVENOUS
  Filled 2017-03-05: qty 6.67

## 2017-03-05 MED ORDER — VANCOMYCIN HCL IN DEXTROSE 1-5 GM/200ML-% IV SOLN
1000.0000 mg | INTRAVENOUS | Status: DC
  Administered 2017-03-05: 1000 mg via INTRAVENOUS
  Filled 2017-03-05: qty 200

## 2017-03-05 NOTE — Progress Notes (Addendum)
[place] [facility] Progess Note    SYNOPSIS     ASSESSMENT/PLAN   Status post cardiac arrest. Patient status is his poor, suffering from multisystem organ failure, respiratory failure, renal failure, status post echocardiogram yesterday, pending report, has been having difficulties with low blood sugars on a D10 infusion, started Solu-Cortef, still requiring vasopressin, Neo-Synephrine and norepinephrine along with bicarbonate infusion  Acid-based derangement. On bicarbonate infusion, predominantly respiratory, will increase minute ventilation as able, every 6 hours arterial blood gases will stop bicarbonate when above 7.3  Shock. Multifactorial etiology, status post arrest, overwhelming sepsis, probable protruding cardiac disease, pending repeat echocardiography. We'll continue pressor support, wean as tolerated, start on hydrocortisone  Respiratory failure. Patient not weanable at this time  Renal failure. Presently on CRRT, appreciate nephrology  Pancytopenia most likely reflects overwhelming sepsis with pancytopenia, will replace platelets as needed, will check PT/INR and DIC panel  Critical care time 45 minutes  Name: Kyle Vasquez MRN: [id number] DOB: [birth year]    ADMISSION DATE:  03/07/2017   SUBJECTIVE:   Pt currently on the ventilator, can not provide history or review of systems.   VITAL SIGNS: Temp:  [94.6 F (34.8 C)-97.7 F (36.5 C)] 97.3 F (36.3 C) (12/09 0700) Pulse Rate:  [55-93] 77 (12/09 0700) Resp:  [0-22] 20 (12/09 0700) BP: (73-122)/(48-71) 105/64 (12/09 0700) SpO2:  [87 %-97 %] 91 % (12/09 0700) Arterial Line BP: (84-132)/(36-68) 118/49 (12/09 0700) FiO2 (%):  [50 %] 50 % (12/09 0700)   PHYSICAL EXAMINATION: Physical Examination:   VS: BP 105/64   Pulse 77   Temp (!) 97.3 F (36.3 C)   Resp 20   Ht 5\' 6"  (1.676 m)   Wt 71.2 kg (156 lb 15.5 oz)   SpO2 91%   BMI 25.34 kg/m   General Appearance: Presently intubated, on  multiple pressors,, on CRRT, bicarbonate infusion Neuro: Sedated unresponsive Pulmonary: Coarse breath sounds and crackles appreciated throughout both lung fields   Cardiovascular regular rhythm with frequent ectopy Abdomen: Absent bowel sounds, taught exam Skin:   no rashes, no ecchymosis  Extremities: On pressors, cool extremities but palpable pulses    LABORATORY PANEL:   CBC Recent Labs  Lab 03/05/17 0523  WBC 2.7*  HGB 8.4*  HCT 25.6*  PLT 82*    Chemistries  Recent Labs  Lab 03/04/17 0608  03/05/17 0249 03/05/17 0523  NA 140   < > 133*  --   K 5.6*   < > 3.7  --   CL 118*   < > 105  --   CO2 17*   < > 21*  --   GLUCOSE 41*   < > 213*  --   BUN 65*   < > 33*  --   CREATININE 3.69*   < > 2.13*  --   CALCIUM 7.5*   < > 6.7*  --   MG 2.1  --   --  1.5*  PHOS 4.5   < > 2.2*  --   AST 110*  --   --   --   ALT 60  --   --   --   ALKPHOS 67  --   --   --   BILITOT 0.9  --   --   --    < > = values in this interval not displayed.    Recent Labs  Lab 03/05/17 0218 03/05/17 0303 03/05/17 0417 03/05/17 0535 03/05/17 0601 03/05/17 0801  GLUCAP 50* 202* 79  33* 111* 47*   Recent Labs  Lab 03/04/17 0349 03/04/17 0604 03/04/17 1104  PHART 7.13* 7.20* 7.20*  PCO2ART 49* 43 39  PO2ART 237* 218* 74*   Recent Labs  Lab 02/25/2017 1827 03/04/17 0331 03/04/17 0608  03/04/17 1503 03/04/17 2053 03/05/17 0249  AST 82* 91* 110*  --   --   --   --   ALT 52 51 60  --   --   --   --   ALKPHOS 73 61 67  --   --   --   --   BILITOT 0.5 0.7 0.9  --   --   --   --   ALBUMIN 2.6* 2.2* 2.3*   < > 2.2* 2.1* 1.8*   < > = values in this interval not displayed.    Cardiac Enzymes Recent Labs  Lab 03/04/17 0608  TROPONINI 0.38*    RADIOLOGY:  Dg Abdomen 1 View  Result Date: 03/11/2017 CLINICAL DATA:  Encounter for orogastric tube placement. EXAM: ABDOMEN - 1 VIEW COMPARISON:  None. FINDINGS: The tip and side port of a gastric tube are seen in the expected location  of the stomach. The included heart is enlarged. Pleural thickening and/or small loculated fluid is noted along the periphery the right hemithorax. Partially visualized distal aortic stent graft. Lumbar fusion hardware noted at L2-3. No radio-opaque calculi or other significant radiographic abnormality are seen. IMPRESSION: 1. Gastric tube in the expected location of the stomach. 2. Cardiomegaly with pleural thickening versus small loculated pleural fluid. Electronically Signed   By: Ashley Royalty M.D.   On: 03/17/2017 19:04   Dg Chest Port 1 View  Result Date: 03/04/2017 CLINICAL DATA:  Central line placement EXAM: PORTABLE CHEST - 1 VIEW COMPARISON:  Earlier film of the same day FINDINGS: Left IJ HD catheter is been placed, tip near the junction of the left innominate vein to the SVC. No pneumothorax. Endotracheal tube and nasogastric tube remain in place. Left subclavian transvenous pacemaker incompletely visualized. Cervical fixation hardware noted. Coarse moderate interstitial airspace opacities throughout the visualized lungs. The bases are excluded. Visualized bones unremarkable. IMPRESSION: 1. Left IJ central line to the junction of left innominate vein to the SVC. No pneumothorax. Electronically Signed   By: Lucrezia Europe M.D.   On: 03/04/2017 10:12   Dg Chest Port 1 View  Result Date: 03/04/2017 CLINICAL DATA:  83 year old male with acute respiratory failure. EXAM: PORTABLE CHEST 1 VIEW COMPARISON:  Chest radiograph dated 03/07/2017 FINDINGS: The endotracheal tube remains above the carina in stable positioning. There is right-sided pleural effusion and right lung base atelectasis/ infiltrate. Diffuse interstitial coarsening and left lung base atelectatic changes. Pleural thickening or loculated fluid in the right upper lobe. Stable cardiac silhouette. Atherosclerotic calcification of the aortic arch. Left pectoral pacemaker device. IMPRESSION: Right-sided pleural effusion and right lung base atelectasis/  infiltrate. Overall no significant interval change in the appearance of the lungs compared to the earlier radiograph. Electronically Signed   By: Anner Crete M.D.   On: 03/04/2017 07:17   Dg Chest Portable 1 View  Result Date: 03/08/2017 CLINICAL DATA:  Patient found on floor by family in with agonal breathing. EXAM: PORTABLE CHEST 1 VIEW COMPARISON:  02/01/2017 FINDINGS: Cardiomegaly with aortic atherosclerosis. Endotracheal tube is noted 4.3 cm above the carina in satisfactory position. Pulmonary vascular redistribution is seen consistent with pulmonary edema. Superimposed airspace opacities in the periphery of the right upper and central left upper lobes cannot concomitant pneumonia.  No left effusion. The right lateral costophrenic angle is included as part of the abdomen radiographs acquired sequentially with this exam. There does appear to be pleural thickening or a small fluid loculation along the periphery of the right hemithorax. Spinal fusion hardware noted of the included lower cervical spine. External defibrillator paddles project over the cardiac silhouette. No acute osseous abnormality. IMPRESSION: 1. Cardiomegaly with aortic atherosclerosis. Pleural thickening versus small amount of loculated fluid along the periphery of the visualized right hemithorax. 2. Satisfactory endotracheal tube position. 3. Diffuse pulmonary vascular redistribution consistent with pulmonary edema. 4. Superimposed pneumonia in the upper lobes is not excluded. Electronically Signed   By: Ashley Royalty M.D.   On: 03/18/2017 18:53   [place] DO 03/05/2017

## 2017-03-05 NOTE — Progress Notes (Signed)
Opens eyes spontaneously at times. Does not appear to focus or track. Grips weakly with right hand but doesn't seem to squeeze to command. Withdraws feet to light sole pressure. No cough of gag noted today. Monitors AFib with BBB and sometimes SR and v paced beats. Vasopressors titrated down. Currently levophed at 65mcg. Vasopressin at.0.03units and phenylephrine at 162mcg.  Fentanyl gtt at 140mcg. He appears comfortable with RAS -2. ETT secretions are scant blood tinge. Vent 40%/ TV663ml/ rate 25/ 5 peep. Sats good.  Blood sugars improved with solucortef and D10 at 125/hr. Protonix gtt continues 8mg hr. OGT to LWIS with small amount yellow output. No stools. No bowel sounds. CRRT runs without complications. UF 74ml/hr. 30ml UOP this shift.  Family has been in and out of room today. They have spoken with Dr Jefferson Fuel and Dr Earleen Newport. Son tells me that he and his dad had discussions past few months about not wanting dialysis. In speaking with son tonight, I believe they would like to transition to terminal extubation and comfort care tomorrow. He spoke about multi organ failure and his father's wishes, He wishes for his dad to be comfortable in passing. The pt has a wife and 3 children. Palliative care was consulted.

## 2017-03-05 NOTE — Progress Notes (Signed)
ANTICOAGULATION CONSULT NOTE - Follow up [place] for post-KCentra monitoring Indication: elevated INR   No Known Allergies  Patient Measurements: Height: 5\' 6"  (167.6 cm) Weight: 156 lb 15.5 oz (71.2 kg) IBW/kg (Calculated) : 63.8 Heparin Dosing Weight:   Vital Signs: Temp: 97.3 F (36.3 C) (12/09 0700) BP: 105/64 (12/09 0700) Pulse Rate: 77 (12/09 0700)  Labs: Recent Labs    02/26/2017 1827  03/04/17 0331  03/04/17 0608 03/04/17 1048 03/04/17 1503 03/04/17 1649 03/04/17 2053 03/05/17 0249 03/05/17 0523  HGB 6.7*   < > 6.6*  --  9.2*  --   --   --   --   --  8.4*  HCT 21.2*   < > 20.4*  --  27.5*  --   --   --   --   --  25.6*  PLT 77*   < > 70*  --  110*  --   --   --   --   --  82*  APTT 80*  --   --   --  48*  --   --   --   --   --  53*  LABPROT 61.0*   < >  --    < > 21.9* 21.3*  --  20.2*  --   --  19.4*  INR 7.16*   < >  --    < > 1.93 1.86  --  1.74  --   --  1.65  CREATININE 4.01*   < > 3.65*  --  3.69* 3.33* 2.82*  --  2.38* 2.13*  --   TROPONINI 0.03*  --   --   --  0.38*  --   --   --   --   --   --    < > = values in this interval not displayed.    Estimated Creatinine Clearance: 23.7 mL/min (A) (by C-G formula based on SCr of 2.13 mg/dL (H)).   Medical History: Past Medical History:  Diagnosis Date  . Anemia   . Atrial fibrillation ([place])   . Bradycardia   . Chronic kidney disease   . COPD (chronic obstructive pulmonary disease) ([place])   . Coronary artery disease   . Depression   . Heart murmur   . HLD (hyperlipidemia)   . Hypertension   . Hypothyroidism   . Lumbar myelopathy ([place])   . MDS (myelodysplastic syndrome) ([place])   . OSA (obstructive sleep apnea)   . Stroke [facility])    left sided weakness  . Syncope     Medications:  Medications Prior to Admission  Medication Sig Dispense Refill Last Dose  . acetaminophen (TYLENOL) 325 MG tablet Take 1 tablet (325 mg total) by mouth every 6 (six) hours as needed for mild pain.    prn at prn  . albuterol (PROVENTIL HFA;VENTOLIN HFA) 108 (90 Base) MCG/ACT inhaler Inhale 2 puffs into the lungs every 6 (six) hours as needed for wheezing or shortness of breath.   prn at prn  . amLODipine (NORVASC) 2.5 MG tablet Take 1 tablet by mouth daily.     Marland Kitchen amLODipine (NORVASC) 5 MG tablet Take 1 tablet (5 mg total) by mouth daily. 30 tablet 0 01/31/2017 at 0800  . aspirin EC 81 MG tablet Take 81 mg by mouth daily.   01/31/2017 at 0800  . azithromycin (ZITHROMAX) 250 MG tablet 250mg  once daily for 4 more days 4 each 0   . cyanocobalamin 500 MCG  tablet Take 500 mcg by mouth 2 (two) times daily.   01/31/2017 at 1800  . docusate sodium (COLACE) 100 MG capsule Take 1 capsule (100 mg total) by mouth 2 (two) times daily as needed for mild constipation. 10 capsule 0 01/31/2017 at 1800  . Fluticasone-Salmeterol (ADVAIR) 250-50 MCG/DOSE AEPB Inhale 1 puff into the lungs 2 (two) times daily.   01/31/2017 at 1800  . HYDROcodone-acetaminophen (NORCO/VICODIN) 5-325 MG tablet Take 1-2 tablets by mouth every 4 (four) hours as needed for moderate pain. (Patient not taking: Reported on 01/27/2017) 30 tablet 0 prn at prn  . levothyroxine (SYNTHROID, LEVOTHROID) 25 MCG tablet Take 1 tablet (25 mcg total) by mouth daily before breakfast. 30 tablet 0 01/31/2017 at 0800  . Multiple Vitamins-Minerals (MULTIVITAMIN WITH MINERALS) tablet Take 1 tablet by mouth daily.   01/31/2017 at 0800  . rosuvastatin (CRESTOR) 5 MG tablet Take 1 tablet (5 mg total) by mouth daily at 6 PM. 30 tablet  01/31/2017 at 1800  . tamsulosin (FLOMAX) 0.4 MG CAPS capsule Take 1 capsule (0.4 mg total) by mouth daily. 30 capsule 0 01/31/2017 at 0800  . warfarin (COUMADIN) 3 MG tablet Take 3 mg by mouth daily. Take as directed by anticoagulation clinic   01/31/2017 at 0800    Assessment: Pharmacy consulted to dose/monitor Kyle Vasquez in this 83 year old male s/p cardiac arrest .  Pt had cardiac arrest secondary to hypotension due to GI bleed.   Pt was on  warfarin at home ;  12/07 INR = 7.16  Goal of Therapy:  INR < 2    Plan:  Will order INR 1 hr post KCentra administration and then check INR Q6H X 4 and then check daily X 2 .   12/8 @ 06:08  INR = 1.93 12/8 @ 10:48  INR = 1.86 12/8 @ 16:49  INR = 1.74 12/9 @ 02:49  INR = 1.77 12/9 @ 05:23  INR = 1.65   Will recheck INR with AM labs.  Lorian Yaun K, RPH 03/05/2017,9:01 AM

## 2017-03-05 NOTE — Progress Notes (Signed)
Initial Nutrition Assessment  DOCUMENTATION CODES:   Not applicable  INTERVENTION:  When patient is hemodynamically stable recommend initiating Vital High Protein at 20 mL/hr.  If patient tolerates can begin advancing Vital High Protein by 15 mL/hr every 8 hours to goal rate of Vital High protein at 65 mL/hr (1560 mL goal daily volume). Provides 1560 kcal, 137 grams of protein, 1310 mL H2O daily.  NUTRITION DIAGNOSIS:   Inadequate oral intake related to inability to eat as evidenced by NPO status.  GOAL:   Provide needs based on ASPEN/SCCM guidelines  MONITOR:   Vent status, Labs, Weight trends, TF tolerance, I & O's  REASON FOR ASSESSMENT:   Ventilator    ASSESSMENT:   83 year old male with PMHx of HTN, hx CVA with left-sided weakness, myelodysplastic syndrome, hypothyroidism, OSA, COPD, CAD, depression, HLD, CKD stage IV, hx AAA s/p repair, hx pacemaker insertion 2/54/2706, diastolic CHF, A-fib on Coumadin who presented following witnessed cardiac arrest at home, received CPR for 6 minutes and was intubated in field on 12/7. Patient found to have shock of multifactorial etiology, respiratory failure, ARF on CKD 4 requiring CVVHD.   No family members at bedside during RD assessment. Patient is currently intubated and sedated. On CVVHD with UF of 50 mL/hr. Weight appears to fluctuate between 149-172 this past year - likely from fluid.  Access: 18 Fr. OGT placed 12/7; terminates in stomach per abdominal x-ray 12/7; 65 cm at corner of mouth  MAP: 58-85 mmHg since midnight  Patient is currently intubated on ventilator support MV: 11.5 L/min Temp (24hrs), Avg:97.2 F (36.2 C), Min:94.6 F (34.8 C), Max:97.7 F (36.5 C)  Propofol: N/A  Medications reviewed and include: D10 at 125 mL/hr (300 grams dextrose; 1020 kcal daily), fentanyl gtt, Levophed gtt (rate trending down this AM currently at 20.6 mL/hr), pantoprazole, phenylephrine gtt (rate trending up this AM currently at  109 mL/hr), Zosyn, potassium phosphate 20 mmol once IV today, vancomycin, vasopressin gtt at 11.3 mL/hr.  Labs reviewed: CBG 29-202 past 24 hrs, Sodium 133, BUN 28, Creatinine 1.9, Phosphorus 2.2, Magnesium 1.5.  Patient does not meet criteria for malnutrition at this time.  Discussed with RN.  NUTRITION - FOCUSED PHYSICAL EXAM:    Most Recent Value  Orbital Region  No depletion  Upper Arm Region  Unable to assess [edema]  Thoracic and Lumbar Region  No depletion  Buccal Region  Unable to assess  Temple Region  No depletion  Clavicle Bone Region  No depletion  Clavicle and Acromion Bone Region  No depletion  Scapular Bone Region  Unable to assess  Dorsal Hand  No depletion  Patellar Region  Unable to assess  Anterior Thigh Region  Unable to assess  Posterior Calf Region  Unable to assess  Edema (RD Assessment)  Moderate [to BLE,  non pitting edema to BUE]  Hair  Reviewed  Eyes  Unable to assess  Mouth  Unable to assess  Skin  Reviewed  Nails  Reviewed     Diet Order:  Diet NPO time specified  EDUCATION NEEDS:   No education needs have been identified at this time  Skin:  Skin Assessment: Reviewed RN Assessment(no issues)  Last BM:  02/28/2017 - small type 3  Height:   Ht Readings from Last 1 Encounters:  03/05/2017 5\' 6"  (1.676 m)    Weight:   Wt Readings from Last 1 Encounters:  03/24/2017 156 lb 15.5 oz (71.2 kg)    Ideal Body Weight:  64.5  kg  BMI:  Body mass index is 25.34 kg/m.  Estimated Nutritional Needs:   Kcal:  1541 (PSU 2003b w/ MSJ 1355, Ve 11.5, Tmax 36.5)  Protein:  114-142 grams (1.6-2 grams/kg)  Fluid:  1.8 L/day (25 mL/kg)  Willey Blade, MS, RD, LDN Office: [phone] Pager: [phone] After Hours/Weekend Pager: [phone]

## 2017-03-05 NOTE — Progress Notes (Signed)
Patient has been having trouble with blood sugar and low blood pressure this shift. Has been unresponsive all shift, except for one time when patient opened his eyes and was biting on ETT. Had 2 mg Versed and no other incidents. Continue to monitor.

## 2017-03-05 NOTE — Progress Notes (Signed)
[facility] Cardiology  SUBJECTIVE: Intubated   Vitals:   03/05/17 0700 03/05/17 0820 03/05/17 0900 03/05/17 1000  BP: 105/64  98/66 113/75  Pulse: 77  77 76  Resp: 20  20 (!) 24  Temp: (!) 97.3 F (36.3 C)  (!) 97.3 F (36.3 C) (!) 97.3 F (36.3 C)  TempSrc:      SpO2: 91% 93% 94% 94%  Weight:      Height:         Intake/Output Summary (Last 24 hours) at 03/05/2017 1014 Last data filed at 03/05/2017 1000 Gross per 24 hour  Intake 9279.84 ml  Output 1150 ml  Net 8129.84 ml      PHYSICAL EXAM  General: Intubated HEENT:  Normocephalic and atramatic Neck:  No JVD.  Lungs: Clear bilaterally to auscultation and percussion. Heart: HRRR . Normal S1 and S2 without gallops or murmurs.  Abdomen: Bowel sounds are positive, abdomen soft and non-tender  Msk:  Back normal, normal gait. Normal strength and tone for age. Extremities: No clubbing, cyanosis or edema.   Neuro: Intubated Psych:  Intubated   LABS: Basic Metabolic Panel: Recent Labs    03/04/17 0608  03/05/17 0249 03/05/17 0523 03/05/17 0843  NA 140   < > 133*  --  133*  K 5.6*   < > 3.7  --  3.9  CL 118*   < > 105  --  102  CO2 17*   < > 21*  --  23  GLUCOSE 41*   < > 213*  --  102*  BUN 65*   < > 33*  --  28*  CREATININE 3.69*   < > 2.13*  --  1.90*  CALCIUM 7.5*   < > 6.7*  --  7.0*  MG 2.1  --   --  1.5*  --   PHOS 4.5   < > 2.2*  --  2.2*   < > = values in this interval not displayed.   Liver Function Tests: Recent Labs    03/04/17 0331 03/04/17 0608  03/05/17 0249 03/05/17 0843  AST 91* 110*  --   --   --   ALT 51 60  --   --   --   ALKPHOS 61 67  --   --   --   BILITOT 0.7 0.9  --   --   --   PROT 5.1* 5.6*  --   --   --   ALBUMIN 2.2* 2.3*   < > 1.8* 1.9*   < > = values in this interval not displayed.   No results for input(s): LIPASE, AMYLASE in the last 72 hours. CBC: Recent Labs    03/14/2017 1827  03/04/17 0608 03/05/17 0523  WBC 2.2*   < > 2.2* 2.7*  NEUTROABS 1.9  --   --   --   HGB  6.7*   < > 9.2* 8.4*  HCT 21.2*   < > 27.5* 25.6*  MCV 96.4   < > 91.8 91.5  PLT 77*   < > 110* 82*   < > = values in this interval not displayed.   Cardiac Enzymes: Recent Labs    03/27/2017 1827 03/04/17 0608  TROPONINI 0.03* 0.38*   BNP: Invalid input(s): POCBNP D-Dimer: No results for input(s): DDIMER in the last 72 hours. Hemoglobin A1C: No results for input(s): HGBA1C in the last 72 hours. Fasting Lipid Panel: No results for input(s): CHOL, HDL, LDLCALC, TRIG, CHOLHDL, LDLDIRECT in the  last 72 hours. Thyroid Function Tests: No results for input(s): TSH, T4TOTAL, T3FREE, THYROIDAB in the last 72 hours.  Invalid input(s): FREET3 Anemia Panel: No results for input(s): VITAMINB12, FOLATE, FERRITIN, TIBC, IRON, RETICCTPCT in the last 72 hours.  Dg Abdomen 1 View  Result Date: 03/10/2017 CLINICAL DATA:  Encounter for orogastric tube placement. EXAM: ABDOMEN - 1 VIEW COMPARISON:  None. FINDINGS: The tip and side port of a gastric tube are seen in the expected location of the stomach. The included heart is enlarged. Pleural thickening and/or small loculated fluid is noted along the periphery the right hemithorax. Partially visualized distal aortic stent graft. Lumbar fusion hardware noted at L2-3. No radio-opaque calculi or other significant radiographic abnormality are seen. IMPRESSION: 1. Gastric tube in the expected location of the stomach. 2. Cardiomegaly with pleural thickening versus small loculated pleural fluid. Electronically Signed   By: Ashley Royalty M.D.   On: 03/27/2017 19:04   Dg Chest Port 1 View  Result Date: 03/04/2017 CLINICAL DATA:  Central line placement EXAM: PORTABLE CHEST - 1 VIEW COMPARISON:  Earlier film of the same day FINDINGS: Left IJ HD catheter is been placed, tip near the junction of the left innominate vein to the SVC. No pneumothorax. Endotracheal tube and nasogastric tube remain in place. Left subclavian transvenous pacemaker incompletely visualized.  Cervical fixation hardware noted. Coarse moderate interstitial airspace opacities throughout the visualized lungs. The bases are excluded. Visualized bones unremarkable. IMPRESSION: 1. Left IJ central line to the junction of left innominate vein to the SVC. No pneumothorax. Electronically Signed   By: Lucrezia Europe M.D.   On: 03/04/2017 10:12   Dg Chest Port 1 View  Result Date: 03/04/2017 CLINICAL DATA:  83 year old male with acute respiratory failure. EXAM: PORTABLE CHEST 1 VIEW COMPARISON:  Chest radiograph dated 03/13/2017 FINDINGS: The endotracheal tube remains above the carina in stable positioning. There is right-sided pleural effusion and right lung base atelectasis/ infiltrate. Diffuse interstitial coarsening and left lung base atelectatic changes. Pleural thickening or loculated fluid in the right upper lobe. Stable cardiac silhouette. Atherosclerotic calcification of the aortic arch. Left pectoral pacemaker device. IMPRESSION: Right-sided pleural effusion and right lung base atelectasis/ infiltrate. Overall no significant interval change in the appearance of the lungs compared to the earlier radiograph. Electronically Signed   By: Anner Crete M.D.   On: 03/04/2017 07:17   Dg Chest Portable 1 View  Result Date: 03/04/2017 CLINICAL DATA:  Patient found on floor by family in with agonal breathing. EXAM: PORTABLE CHEST 1 VIEW COMPARISON:  02/01/2017 FINDINGS: Cardiomegaly with aortic atherosclerosis. Endotracheal tube is noted 4.3 cm above the carina in satisfactory position. Pulmonary vascular redistribution is seen consistent with pulmonary edema. Superimposed airspace opacities in the periphery of the right upper and central left upper lobes cannot concomitant pneumonia. No left effusion. The right lateral costophrenic angle is included as part of the abdomen radiographs acquired sequentially with this exam. There does appear to be pleural thickening or a small fluid loculation along the  periphery of the right hemithorax. Spinal fusion hardware noted of the included lower cervical spine. External defibrillator paddles project over the cardiac silhouette. No acute osseous abnormality. IMPRESSION: 1. Cardiomegaly with aortic atherosclerosis. Pleural thickening versus small amount of loculated fluid along the periphery of the visualized right hemithorax. 2. Satisfactory endotracheal tube position. 3. Diffuse pulmonary vascular redistribution consistent with pulmonary edema. 4. Superimposed pneumonia in the upper lobes is not excluded. Electronically Signed   By: Meredith Leeds.D.  On: 03/01/2017 18:53     Echo pending  TELEMETRY: Atrial and ventricular pacing:  ASSESSMENT AND PLAN:  Active Problems:   Cardiac arrest ([place])   Acute respiratory failure ([facility])   Hyperkalemia   Gastrointestinal hemorrhage   Acute blood loss anemia    1.  Status post cardiopulmonary arrest, no evidence for ST elevation, ventricular arrhythmia, likely noncardiac etiology, no indication for cardiac catheterization 2.  Respiratory failure, intubated 3.  Septic shock, requiring vasopressin 4.  Acute renal failure, on CRRT 5.  Profound anemia, status post transfusion 6.  History of CVA 7.  Sick sinus syndrome, status post pacemaker 8.  Multiorgan failure, extremely poor prognosis  Recommendations  1.  Agree with current therapy 2.  Review 2D echocardiogram 3.  Defer invasive cardiac evaluation  Sign off for now, please call if any questions   Isaias Cowman, MD, PhD, [facility] 03/05/2017 10:14 AM

## 2017-03-05 NOTE — Progress Notes (Signed)
Dr. Jefferson Fuel rounding on patient and ordered 100 hydrocortisone when he was told about the cbgs. Order placed.

## 2017-03-05 NOTE — Progress Notes (Signed)
Patient's cbg dropped to 29 after MN. D50 was administered. Informed NP and ordered an additional dose of D50. Administered and cbg back to 121 then 131. Frequency changed to q 1 hour.

## 2017-03-05 NOTE — Progress Notes (Signed)
MEDICATION RELATED CONSULT NOTE - FOLLOW UP   Pharmacy Consult for electrolyte management Indication: hypophosphatemia  No Known Allergies  Patient Measurements: Height: 5\' 6"  (167.6 cm) Weight: 156 lb 15.5 oz (71.2 kg) IBW/kg (Calculated) : 63.8 Adjusted Body Weight:   Vital Signs: Temp: 97.3 F (36.3 C) (12/09 1000) BP: 113/75 (12/09 1000) Pulse Rate: 76 (12/09 1000) Intake/Output from previous day: 12/08 0701 - 12/09 0700 In: 9693.7 [I.V.:9193.7; IV Piggyback:500] Out: 1006 [Urine:20] Intake/Output from this shift: Total I/O In: 1269.6 [I.V.:1169.6; IV Piggyback:100] Out: 144 [Other:144]  Labs: Recent Labs    03/02/2017 1827  03/08/2017 2320  03/04/17 0331 03/04/17 0608  03/04/17 2053 03/05/17 0249 03/05/17 0523 03/05/17 0843  WBC 2.2*  --   --    < > 1.7* 2.2*  --   --   --  2.7*  --   HGB 6.7*  --   --    < > 6.6* 9.2*  --   --   --  8.4*  --   HCT 21.2*  --   --    < > 20.4* 27.5*  --   --   --  25.6*  --   PLT 77*  --   --    < > 70* 110*  --   --   --  82*  --   APTT 80*  --   --   --   --  48*  --   --   --  53*  --   CREATININE 4.01*  --  3.76*  --  3.65* 3.69*   < > 2.38* 2.13*  --  1.90*  MG  --    < > 2.4  --   --  2.1  --   --   --  1.5*  --   PHOS  --    < > 4.2  --   --  4.5   < > 2.9 2.2*  --  2.2*  ALBUMIN 2.6*  --   --   --  2.2* 2.3*   < > 2.1* 1.8*  --  1.9*  PROT 6.0*  --   --   --  5.1* 5.6*  --   --   --   --   --   AST 82*  --   --   --  91* 110*  --   --   --   --   --   ALT 52  --   --   --  51 60  --   --   --   --   --   ALKPHOS 73  --   --   --  61 67  --   --   --   --   --   BILITOT 0.5  --   --   --  0.7 0.9  --   --   --   --   --    < > = values in this interval not displayed.   Estimated Creatinine Clearance: 26.6 mL/min (A) (by C-G formula based on SCr of 1.9 mg/dL (H)).   Microbiology: Recent Results (from the past 720 hour(s))  MRSA PCR Screening     Status: None   Collection Time: 03/16/2017  9:15 PM  Result Value Ref  Range Status   MRSA by PCR NEGATIVE NEGATIVE Final    Comment:        The GeneXpert MRSA Assay (FDA approved for NASAL specimens  only), is one component of a comprehensive MRSA colonization surveillance program. It is not intended to diagnose MRSA infection nor to guide or monitor treatment for MRSA infections.   Culture, blood (Routine X 2) w Reflex to ID Panel     Status: None (Preliminary result)   Collection Time: 03/20/2017  9:39 PM  Result Value Ref Range Status   Specimen Description BLOOD LEFT HAND  Final   Special Requests   Final    BOTTLES DRAWN AEROBIC AND ANAEROBIC Blood Culture adequate volume   Culture NO GROWTH 2 DAYS  Final   Report Status PENDING  Incomplete  Culture, blood (Routine X 2) w Reflex to ID Panel     Status: None (Preliminary result)   Collection Time: 03/20/2017  9:43 PM  Result Value Ref Range Status   Specimen Description BLOOD RIGHT HAND  Final   Special Requests   Final    BOTTLES DRAWN AEROBIC AND ANAEROBIC Blood Culture adequate volume   Culture NO GROWTH 2 DAYS  Final   Report Status PENDING  Incomplete    Medications:  Infusions:  . dextrose 125 mL/hr at 03/05/17 0933  . fentaNYL infusion INTRAVENOUS 150 mcg/hr (03/05/17 0856)  . norepinephrine (LEVOPHED) Adult infusion 22 mcg/min (03/05/17 1000)  . pantoprozole (PROTONIX) infusion 8 mg/hr (03/05/17 0700)  . phenylephrine (NEO-SYNEPHRINE) Adult infusion 290.667 mcg/min (03/05/17 0856)  . piperacillin-tazobactam (ZOSYN)  IV Stopped (03/05/17 1008)  . potassium PHOSPHATE IVPB (mmol)    . pureflow 3 each (03/05/17 9390)  . vancomycin    . vasopressin (PITRESSIN) infusion - *FOR SHOCK* 0.03 Units/min (03/05/17 0700)    Assessment: 56 yom sp cardiac arrest with multisystem organ failure, respiratory failure, renal failure, ECHO pending, requiring vasopressin, phenylephrine, and norepinephrine infusions and sodium bicarbonate. Electrolytes noted to be low this morning. Pharmacy consulted to  manage electrolytes.   12/9  K 3.8,  Mg 1.5,  Phos 2.2,  Ca 7 (adjusted 8.7)   Goal of Therapy:  K 3.5 to 5 Ca 8.9 to 10.3 Mg 1.7 to 2.4 Phos 2.5 to 4.6  Plan:  RN has already given magnesium sulfate 2 gm IV x 1. Will also give potassium phosphate 20 mmol IV x 1 and recheck all electrolytes tomorrow with AM labs.  Laural Benes, Pharm.D., BCPS 03/05/2017,10:18 AM

## 2017-03-05 NOTE — Progress Notes (Addendum)
WUA not done this am per Dr Order. Pt with frequent ectopy and on multiple pressors. Levophed weaned to 73mcg. Remains on vasopressin and high dose phenylephrine. Fentanyl weaned to RAS -2 and currently at 166mcg. Opens eyes spontaneously, Withdraws slightly to sole pressure. No UE response. No gag. No cough effort. UOP scant. CRRT continues without complication. ABG was drawn and Dr Jefferson Fuel aware of result. Minute Ventilation change per RT. Blood sugars have been low. 2 amps d50 since 7am. Bicarb gtt off and D10 increased to 125/hr.

## 2017-03-05 NOTE — Progress Notes (Signed)
Patient ID: Kyle Vasquez, male   DOB: 1931-11-18, 83 y.o.   MRN: [id number]  [facility] PROGRESS NOTE  Kyle Vasquez TMH:[id number] DOB: September 10, 1931 DOA: 03/13/2017 PCP: Kyle Harrier, MD  HPI/Subjective: Patient intubated and sedated.  Objective: Vitals:   03/05/17 1100 03/05/17 1200  BP: 99/63 100/67  Pulse: 78 74  Resp: (!) 24 (!) 24  Temp: (!) 97.3 F (36.3 C) (!) 97.5 F (36.4 C)  SpO2: 96% 96%    Filed Weights   03/17/2017 1825 03/18/2017 2130 03/13/2017 2232  Weight: 91 kg (200 lb 9.9 oz) 81.5 kg (179 lb 10.8 oz) 71.2 kg (156 lb 15.5 oz)    ROS: Review of Systems  Unable to perform ROS: Acuity of condition   Exam: Physical Exam  Constitutional: He is intubated.  HENT:  Nose: No mucosal edema.  Unable to look into mouth  Eyes: Conjunctivae and lids are normal.  Pupils pinpoint.  Neck: Carotid bruit is not present. No thyromegaly present.  Cardiovascular: S1 normal, S2 normal and normal heart sounds.  Pulses:      Dorsalis pedis pulses are 1+ on the right side, and 1+ on the left side.  Respiratory: He is intubated. He has decreased breath sounds in the right lower field and the left lower field. He has rhonchi in the right middle field, the right lower field and the left lower field.  GI: Soft. Bowel sounds are decreased. There is no tenderness.  Musculoskeletal:       Right ankle: He exhibits swelling.       Left ankle: He exhibits swelling.  Neurological: He is unresponsive.  Skin: Skin is dry. No rash noted. Nails show no clubbing.  Psychiatric:  Patient intubated and sedated.      Data Reviewed: Basic Metabolic Panel: Recent Labs  Lab 03/06/2017 2138 03/02/2017 2320  03/04/17 9211 03/04/17 1048 03/04/17 1503 03/04/17 2053 03/05/17 0249 03/05/17 0523 03/05/17 0843  NA  --  138   < > 140 139 139 137 133*  --  133*  K  --  5.9*   < > 5.6* 5.4* 5.1 4.6 3.7  --  3.9  CL  --  117*   < > 118* 116* 111 107 105  --  102  CO2  --  15*   < > 17*  17* 21* 22 21*  --  23  GLUCOSE  --  95   < > 41* 61* 77 72 213*  --  102*  BUN  --  72*   < > 65* 63* 52* 43* 33*  --  28*  CREATININE  --  3.76*   < > 3.69* 3.33* 2.82* 2.38* 2.13*  --  1.90*  CALCIUM  --  8.1*   < > 7.5* 7.4* 7.2* 7.3* 6.7*  --  7.0*  MG 2.5* 2.4  --  2.1  --   --   --   --  1.5*  --   PHOS 4.0 4.2  --  4.5 3.7 3.3 2.9 2.2*  --  2.2*   < > = values in this interval not displayed.   Liver Function Tests: Recent Labs  Lab 03/08/2017 1827 03/04/17 0331 03/04/17 9417 03/04/17 1048 03/04/17 1503 03/04/17 2053 03/05/17 0249 03/05/17 0843  AST 82* 91* 110*  --   --   --   --   --   ALT 52 51 60  --   --   --   --   --   Kyle Vasquez  73 61 67  --   --   --   --   --   BILITOT 0.5 0.7 0.9  --   --   --   --   --   PROT 6.0* 5.1* 5.6*  --   --   --   --   --   ALBUMIN 2.6* 2.2* 2.3* 2.2* 2.2* 2.1* 1.8* 1.9*   CBC: Recent Labs  Lab 02/27/2017 1827 03/04/17 0102 03/04/17 0331 03/04/17 0608 03/05/17 0523  WBC 2.2* 1.9* 1.7* 2.2* 2.7*  NEUTROABS 1.9  --   --   --   --   HGB 6.7* 6.9* 6.6* 9.2* 8.4*  HCT 21.2* 20.9* 20.4* 27.5* 25.6*  MCV 96.4 94.0 96.7 91.8 91.5  PLT 77* 74* 70* 110* 82*   Cardiac Enzymes: Recent Labs  Lab 03/13/2017 1827 03/04/17 0608  TROPONINI 0.03* 0.38*    CBG: Recent Labs  Lab 03/05/17 0801 03/05/17 0846 03/05/17 0939 03/05/17 1111 03/05/17 1228  GLUCAP 47* 96 63* 65 94    Recent Results (from the past 240 hour(s))  MRSA PCR Screening     Status: None   Collection Time: 03/01/2017  9:15 PM  Result Value Ref Range Status   MRSA by PCR NEGATIVE NEGATIVE Final    Comment:        The GeneXpert MRSA Assay (FDA approved for NASAL specimens only), is one component of a comprehensive MRSA colonization surveillance program. It is not intended to diagnose MRSA infection nor to guide or monitor treatment for MRSA infections.   Culture, blood (Routine X 2) w Reflex to ID Panel     Status: None (Preliminary result)   Collection Time:  03/02/2017  9:39 PM  Result Value Ref Range Status   Specimen Description BLOOD LEFT HAND  Final   Special Requests   Final    BOTTLES DRAWN AEROBIC AND ANAEROBIC Blood Culture adequate volume   Culture NO GROWTH 2 DAYS  Final   Report Status PENDING  Incomplete  Culture, blood (Routine X 2) w Reflex to ID Panel     Status: None (Preliminary result)   Collection Time: 03/16/2017  9:43 PM  Result Value Ref Range Status   Specimen Description BLOOD RIGHT HAND  Final   Special Requests   Final    BOTTLES DRAWN AEROBIC AND ANAEROBIC Blood Culture adequate volume   Culture NO GROWTH 2 DAYS  Final   Report Status PENDING  Incomplete     Studies: Dg Abdomen 1 View  Result Date: 03/16/2017 CLINICAL DATA:  Encounter for orogastric tube placement. EXAM: ABDOMEN - 1 VIEW COMPARISON:  None. FINDINGS: The tip and side port of a gastric tube are seen in the expected location of the stomach. The included heart is enlarged. Pleural thickening and/or small loculated fluid is noted along the periphery the right hemithorax. Partially visualized distal aortic stent graft. Lumbar fusion hardware noted at L2-3. No radio-opaque calculi or other significant radiographic abnormality are seen. IMPRESSION: 1. Gastric tube in the expected location of the stomach. 2. Cardiomegaly with pleural thickening versus small loculated pleural fluid. Electronically Signed   By: Kyle Vasquez M.D.   On: 03/08/2017 19:04   Dg Chest Port 1 View  Result Date: 03/05/2017 CLINICAL DATA:  On vent; hx/o a-fib, CKD, COPD, CAD, and stroke; former smoker EXAM: PORTABLE CHEST 1 VIEW COMPARISON:  03/04/2017 FINDINGS: Bilateral interstitial airspace lung opacities, greater on the right, are without significant change from the previous day's study. Bilateral pleural  effusions are stable. No new lung abnormalities. No pneumothorax. Endotracheal tube and left internal jugular dual-lumen central venous line are stable. IMPRESSION: 1. No significant  change from the previous day's study. 2. Persistent, right greater than left lung opacities and bilateral pleural effusions. 3. Support apparatus stable and well positioned. Electronically Signed   By: Kyle Vasquez M.D.   On: 03/05/2017 10:15   Dg Chest Port 1 View  Result Date: 03/04/2017 CLINICAL DATA:  Central line placement EXAM: PORTABLE CHEST - 1 VIEW COMPARISON:  Earlier film of the same day FINDINGS: Left IJ HD catheter is been placed, tip near the junction of the left innominate vein to the SVC. No pneumothorax. Endotracheal tube and nasogastric tube remain in place. Left subclavian transvenous pacemaker incompletely visualized. Cervical fixation hardware noted. Coarse moderate interstitial airspace opacities throughout the visualized lungs. The bases are excluded. Visualized bones unremarkable. IMPRESSION: 1. Left IJ central line to the junction of left innominate vein to the SVC. No pneumothorax. Electronically Signed   By: Lucrezia Europe M.D.   On: 03/04/2017 10:12   Dg Chest Port 1 View  Result Date: 03/04/2017 CLINICAL DATA:  83 year old male with acute respiratory failure. EXAM: PORTABLE CHEST 1 VIEW COMPARISON:  Chest radiograph dated 03/21/2017 FINDINGS: The endotracheal tube remains above the carina in stable positioning. There is right-sided pleural effusion and right lung base atelectasis/ infiltrate. Diffuse interstitial coarsening and left lung base atelectatic changes. Pleural thickening or loculated fluid in the right upper lobe. Stable cardiac silhouette. Atherosclerotic calcification of the aortic arch. Left pectoral pacemaker device. IMPRESSION: Right-sided pleural effusion and right lung base atelectasis/ infiltrate. Overall no significant interval change in the appearance of the lungs compared to the earlier radiograph. Electronically Signed   By: Anner Crete M.D.   On: 03/04/2017 07:17   Dg Chest Portable 1 View  Result Date: 03/22/2017 CLINICAL DATA:  Patient found on  floor by family in with agonal breathing. EXAM: PORTABLE CHEST 1 VIEW COMPARISON:  02/01/2017 FINDINGS: Cardiomegaly with aortic atherosclerosis. Endotracheal tube is noted 4.3 cm above the carina in satisfactory position. Pulmonary vascular redistribution is seen consistent with pulmonary edema. Superimposed airspace opacities in the periphery of the right upper and central left upper lobes cannot concomitant pneumonia. No left effusion. The right lateral costophrenic angle is included as part of the abdomen radiographs acquired sequentially with this exam. There does appear to be pleural thickening or a small fluid loculation along the periphery of the right hemithorax. Spinal fusion hardware noted of the included lower cervical spine. External defibrillator paddles project over the cardiac silhouette. No acute osseous abnormality. IMPRESSION: 1. Cardiomegaly with aortic atherosclerosis. Pleural thickening versus small amount of loculated fluid along the periphery of the visualized right hemithorax. 2. Satisfactory endotracheal tube position. 3. Diffuse pulmonary vascular redistribution consistent with pulmonary edema. 4. Superimposed pneumonia in the upper lobes is not excluded. Electronically Signed   By: Kyle Vasquez M.D.   On: 03/04/2017 18:53    Scheduled Meds: . chlorhexidine gluconate (MEDLINE KIT)  15 mL Mouth Rinse BID  . mouth rinse  15 mL Mouth Rinse 10 times per day  . sodium chloride flush  3 mL Intravenous Q12H   Continuous Infusions: . dextrose 125 mL/hr at 03/05/17 1213  . fentaNYL infusion INTRAVENOUS 100 mcg/hr (03/05/17 1218)  . norepinephrine (LEVOPHED) Adult infusion 20 mcg/min (03/05/17 1252)  . pantoprozole (PROTONIX) infusion 8 mg/hr (03/05/17 0700)  . phenylephrine (NEO-SYNEPHRINE) Adult infusion 290.667 mcg/min (03/05/17 1115)  . piperacillin-tazobactam (ZOSYN)  IV Stopped (03/05/17 1008)  . potassium PHOSPHATE IVPB (mmol) 20 mmol (03/05/17 1112)  . pureflow 3 each  (03/05/17 1236)  . vancomycin    . vasopressin (PITRESSIN) infusion - *FOR SHOCK* 0.03 Units/min (03/05/17 0700)    Assessment/Plan:  1. Acute cardiac arrest.  Supportive care.  2. Hypovolemic shock versus cardiogenic shock.  Patient on 3 pressors to maintain blood pressure. 3. Acute hypoxic respiratory failure.  Continue ventilatory support.  Patient on 50% FiO2. 4. Acute anuric renal failure.  Patient started on CRRT yesterday and continues today. 5. Acute blood loss anemia with supratherapeutic INR.  Patient received 2 units of packed red blood cells with good response.  Patient received vitamin K and K Centra. 6. Metabolic acidosis.  Continue vent management and bicarb drip 7. Clinical sepsis with pneumonia on empiric antibiotics with vancomycin and Zosyn. 8. History of atrial fibrillation.  Holding all meds with hypotension and GI bleed. 9. History of BPH.  Holding Flomax with hypotension 10. Hyperlipidemia unspecified holding Crestor 11. Hypothyroidism unspecified holding levothyroxine. 12. Hypoglycemia.  Patient on D10 drip. 13. Hypothermia.  Patient on bear hugger. 14. Pancytopenia probably secondary to sepsis 15. Pharmacy doing electrolyte protocol to replace electrolytes  Code Status:     Code Status Orders  (From admission, onward)        Start     Ordered   03/04/17 0955  Do not attempt resuscitation (DNR)  Continuous    Question Answer Comment  In the event of cardiac or respiratory ARREST Do not call a "code blue"   In the event of cardiac or respiratory ARREST Do not perform Intubation, CPR, defibrillation or ACLS   In the event of cardiac or respiratory ARREST Use medication by any route, position, wound care, and other measures to relive pain and suffering. May use oxygen, suction and manual treatment of airway obstruction as needed for comfort.      03/04/17 0954    Code Status History    Date Active Date Inactive Code Status Order ID Comments User Context    03/19/2017 19:38 03/04/2017 09:54 Full Code [id number]  Hillary Bow, MD ED   01/27/2017 20:17 01/28/2017 20:34 DNR [id number]  Max Sane, MD ED   12/12/2016 15:56 12/14/2016 20:28 DNR [id number]  Vaughan Basta, MD Inpatient   09/19/2016 19:02 09/21/2016 19:55 Full Code [id number]  Bettey Costa, MD Inpatient   07/20/2016 16:50 07/21/2016 16:51 Full Code [id number]  Isaias Cowman, MD Inpatient   06/29/2016 19:37 07/01/2016 21:03 Full Code [id number]  Gladstone Lighter, MD Inpatient   09/07/2015 21:31 09/08/2015 19:58 Full Code [id number]  Quintella Baton, MD Inpatient    Advance Directive Documentation     Most Recent Value  Type of Advance Directive  Living will  Pre-existing out of facility DNR order (yellow form or pink MOST form)  No data  "MOST" Form in Place?  No data     Family Communication: Patient's son and and family.  Patient son wants to continue doing what we are doing at this point. Disposition Plan: To be determined based on clinical course.  Consultants:  Critical care specialist  Nephrology  Cardiology  Antibiotics:  Vancomycin  Zosyn  Time spent: 28 minutes.  Overall prognosis is poor.   Abayomi Pattison [employer]

## 2017-03-05 NOTE — Progress Notes (Signed)
Pharmacy Antibiotic Note  Kyle Vasquez is a 83 y.o. male admitted on 02/27/2017 with pneumonia.  Pharmacy has been consulted for zosyn and vancomycin dosing.  Plan: 12/8 Vancomycin 1000mg  IV every 48 hours.  Goal trough 15-20 mcg/mL. Zosyn 3.375g IV q8h (4 hour infusion). Patient is on continuous venovenous ultrafiltration, zosyn currently dosed 3.375gm iv q8h, with vancomycin at 1gm iv q48h. These will likely need to be adjusted should the renal therapy change. Please monitor closely   12/9 Renal function has improved. Will change Vancomycin to Q36H dosing. Will continue to monitor and adjust if renal function changes.   Height: 5\' 6"  (167.6 cm) Weight: 156 lb 15.5 oz (71.2 kg) IBW/kg (Calculated) : 63.8  Temp (24hrs), Avg:97.2 F (36.2 C), Min:94.6 F (34.8 C), Max:97.7 F (36.5 C)  Recent Labs  Lab 03/26/2017 1827  03/04/17 0102 03/04/17 0331 03/04/17 0608 03/04/17 1048 03/04/17 1503 03/04/17 2053 03/05/17 0249 03/05/17 0523 03/05/17 0843  WBC 2.2*  --  1.9* 1.7* 2.2*  --   --   --   --  2.7*  --   CREATININE 4.01*   < >  --  3.65* 3.69* 3.33* 2.82* 2.38* 2.13*  --  1.90*  LATICACIDVEN  --   --   --  2.9* 1.9  --   --   --   --   --   --    < > = values in this interval not displayed.    Estimated Creatinine Clearance: 26.6 mL/min (A) (by C-G formula based on SCr of 1.9 mg/dL (H)).    No Known Allergies  Antimicrobials this admission: Anti-infectives (From admission, onward)   Start     Dose/Rate Route Frequency Ordered Stop   03/05/17 2300  vancomycin (VANCOCIN) IVPB 1000 mg/200 mL premix     1,000 mg 200 mL/hr over 60 Minutes Intravenous Every 36 hours 03/05/17 0927     03/04/17 1800  azithromycin (ZITHROMAX) 500 mg in dextrose 5 % 250 mL IVPB  Status:  Discontinued     500 mg 250 mL/hr over 60 Minutes Intravenous Every 24 hours 03/26/2017 2201 03/04/17 0830   03/04/17 1600  vancomycin (VANCOCIN) IVPB 1000 mg/200 mL premix  Status:  Discontinued     1,000 mg 200  mL/hr over 60 Minutes Intravenous Every 48 hours 03/04/17 1109 03/05/17 0927   03/04/17 1115  piperacillin-tazobactam (ZOSYN) IVPB 3.375 g     3.375 g 12.5 mL/hr over 240 Minutes Intravenous Every 8 hours 03/04/17 1112     03/04/17 1000  piperacillin-tazobactam (ZOSYN) IVPB 3.375 g  Status:  Discontinued     3.375 g 12.5 mL/hr over 240 Minutes Intravenous Every 12 hours 03/04/17 0941 03/04/17 1112   03/04/17 0945  vancomycin (VANCOCIN) IVPB 1000 mg/200 mL premix     1,000 mg 200 mL/hr over 60 Minutes Intravenous  Once 03/04/17 0942 03/04/17 1219   03/08/2017 2230  cefTRIAXone (ROCEPHIN) 1 g in dextrose 5 % 50 mL IVPB  Status:  Discontinued     1 g 100 mL/hr over 30 Minutes Intravenous Every 24 hours 03/23/2017 2200 03/04/17 0830   03/02/2017 1915  levofloxacin (LEVAQUIN) IVPB 750 mg     750 mg 100 mL/hr over 90 Minutes Intravenous  Once 03/27/2017 1908 03/05/2017 2212       Dose adjustments this admission: 12/9 Vancomycin 1g Q48H >>Q36H  Microbiology results: Recent Results (from the past 240 hour(s))  MRSA PCR Screening     Status: None   Collection Time: 03/16/2017  9:15 PM  Result Value Ref Range Status   MRSA by PCR NEGATIVE NEGATIVE Final    Comment:        The GeneXpert MRSA Assay (FDA approved for NASAL specimens only), is one component of a comprehensive MRSA colonization surveillance program. It is not intended to diagnose MRSA infection nor to guide or monitor treatment for MRSA infections.   Culture, blood (Routine X 2) w Reflex to ID Panel     Status: None (Preliminary result)   Collection Time: 03/15/2017  9:39 PM  Result Value Ref Range Status   Specimen Description BLOOD LEFT HAND  Final   Special Requests   Final    BOTTLES DRAWN AEROBIC AND ANAEROBIC Blood Culture adequate volume   Culture NO GROWTH 2 DAYS  Final   Report Status PENDING  Incomplete  Culture, blood (Routine X 2) w Reflex to ID Panel     Status: None (Preliminary result)   Collection Time: 03/12/2017   9:43 PM  Result Value Ref Range Status   Specimen Description BLOOD RIGHT HAND  Final   Special Requests   Final    BOTTLES DRAWN AEROBIC AND ANAEROBIC Blood Culture adequate volume   Culture NO GROWTH 2 DAYS  Final   Report Status PENDING  Incomplete    Thank you for allowing pharmacy to be a part of this patient's care.  Olivia Canter, [facility] 03/05/2017 9:28 AM

## 2017-03-06 ENCOUNTER — Inpatient Hospital Stay (HOSPITAL_COMMUNITY)
Admit: 2017-03-06 | Discharge: 2017-03-06 | Disposition: A | Discharge status: Home / Self Care | Payer: Medicare HMO | Attending: Internal Medicine | Admitting: Internal Medicine

## 2017-03-06 DIAGNOSIS — I469 Cardiac arrest, cause unspecified: Secondary | ICD-10-CM

## 2017-03-06 DIAGNOSIS — I5030 Unspecified diastolic (congestive) heart failure: Secondary | ICD-10-CM

## 2017-03-06 LAB — RENAL FUNCTION PANEL
ALBUMIN: 1.7 g/dL — AB (ref 3.5–5.0)
ALBUMIN: 1.7 g/dL — AB (ref 3.5–5.0)
ALBUMIN: 1.8 g/dL — AB (ref 3.5–5.0)
ANION GAP: 7 (ref 5–15)
ANION GAP: 6 (ref 5–15)
ANION GAP: 7 (ref 5–15)
Albumin: 1.8 g/dL — ABNORMAL LOW (ref 3.5–5.0)
Anion gap: 9 (ref 5–15)
BUN: 19 mg/dL (ref 6–20)
BUN: 19 mg/dL (ref 6–20)
BUN: 17 mg/dL (ref 6–20)
BUN: 16 mg/dL (ref 6–20)
CALCIUM: 7 mg/dL — AB (ref 8.9–10.3)
CALCIUM: 7.1 mg/dL — AB (ref 8.9–10.3)
CALCIUM: 7.3 mg/dL — AB (ref 8.9–10.3)
CHLORIDE: 100 mmol/L — AB (ref 101–111)
CO2: 22 mmol/L (ref 22–32)
CO2: 23 mmol/L (ref 22–32)
CO2: 22 mmol/L (ref 22–32)
CO2: 21 mmol/L — ABNORMAL LOW (ref 22–32)
CREATININE: 1.34 mg/dL — AB (ref 0.61–1.24)
CREATININE: 1.31 mg/dL — AB (ref 0.61–1.24)
Calcium: 7 mg/dL — ABNORMAL LOW (ref 8.9–10.3)
Chloride: 100 mmol/L — ABNORMAL LOW (ref 101–111)
Chloride: 100 mmol/L — ABNORMAL LOW (ref 101–111)
Chloride: 99 mmol/L — ABNORMAL LOW (ref 101–111)
Creatinine, Ser: 1.38 mg/dL — ABNORMAL HIGH (ref 0.61–1.24)
Creatinine, Ser: 1.41 mg/dL — ABNORMAL HIGH (ref 0.61–1.24)
GFR calc Af Amer: 53 mL/min — ABNORMAL LOW (ref >60)
GFR calc Af Amer: 52 mL/min — ABNORMAL LOW (ref >60)
GFR calc Af Amer: 56 mL/min — ABNORMAL LOW (ref >60)
GFR, EST AFRICAN AMERICAN: 55 mL/min — AB (ref >60)
GFR, EST NON AFRICAN AMERICAN: 46 mL/min — AB (ref >60)
GFR, EST NON AFRICAN AMERICAN: 44 mL/min — AB (ref >60)
GFR, EST NON AFRICAN AMERICAN: 47 mL/min — AB (ref >60)
GFR, EST NON AFRICAN AMERICAN: 49 mL/min — AB (ref >60)
GLUCOSE: 122 mg/dL — AB (ref 65–99)
Glucose, Bld: 104 mg/dL — ABNORMAL HIGH (ref 65–99)
Glucose, Bld: 101 mg/dL — ABNORMAL HIGH (ref 65–99)
Glucose, Bld: 122 mg/dL — ABNORMAL HIGH (ref 65–99)
PHOSPHORUS: 2 mg/dL — AB (ref 2.5–4.6)
PHOSPHORUS: 2.1 mg/dL — AB (ref 2.5–4.6)
PHOSPHORUS: 2.5 mg/dL (ref 2.5–4.6)
PHOSPHORUS: 2.2 mg/dL — AB (ref 2.5–4.6)
POTASSIUM: 3.7 mmol/L (ref 3.5–5.1)
POTASSIUM: 3.9 mmol/L (ref 3.5–5.1)
Potassium: 4.1 mmol/L (ref 3.5–5.1)
Potassium: 4 mmol/L (ref 3.5–5.1)
SODIUM: 129 mmol/L — AB (ref 135–145)
SODIUM: 129 mmol/L — AB (ref 135–145)
Sodium: 129 mmol/L — ABNORMAL LOW (ref 135–145)
Sodium: 129 mmol/L — ABNORMAL LOW (ref 135–145)

## 2017-03-06 LAB — BASIC METABOLIC PANEL
Anion gap: 6 (ref 5–15)
BUN: 20 mg/dL (ref 6–20)
CO2: 22 mmol/L (ref 22–32)
Calcium: 7.1 mg/dL — ABNORMAL LOW (ref 8.9–10.3)
Chloride: 100 mmol/L — ABNORMAL LOW (ref 101–111)
Creatinine, Ser: 1.44 mg/dL — ABNORMAL HIGH (ref 0.61–1.24)
GFR calc Af Amer: 50 mL/min — ABNORMAL LOW (ref >60)
GFR, EST NON AFRICAN AMERICAN: 43 mL/min — AB (ref >60)
GLUCOSE: 106 mg/dL — AB (ref 65–99)
POTASSIUM: 3.8 mmol/L (ref 3.5–5.1)
Sodium: 128 mmol/L — ABNORMAL LOW (ref 135–145)

## 2017-03-06 LAB — PHOSPHORUS: Phosphorus: 2.1 mg/dL — ABNORMAL LOW (ref 2.5–4.6)

## 2017-03-06 LAB — PROTIME-INR
INR: 1.87
PROTHROMBIN TIME: 21.4 s — AB (ref 11.4–15.2)

## 2017-03-06 LAB — MAGNESIUM: MAGNESIUM: 1.6 mg/dL — AB (ref 1.7–2.4)

## 2017-03-06 LAB — HEMOGLOBIN AND HEMATOCRIT, BLOOD
HEMATOCRIT: 25.8 % — AB (ref 40.0–52.0)
Hemoglobin: 8.6 g/dL — ABNORMAL LOW (ref 13.0–18.0)

## 2017-03-06 LAB — APTT: APTT: 59 s — AB (ref 24–36)

## 2017-03-06 LAB — ECHOCARDIOGRAM COMPLETE
Height: 66 in
Weight: 3537.94 oz

## 2017-03-06 LAB — GLUCOSE, CAPILLARY
Glucose-Capillary: 98 mg/dL (ref 65–99)
Glucose-Capillary: 96 mg/dL (ref 65–99)

## 2017-03-06 MED ORDER — DEXTROSE 5 % IV SOLN
1.0000 g | Freq: Every day | INTRAVENOUS | Status: DC
  Administered 2017-03-06 – 2017-03-08 (×3): 1 g via INTRAVENOUS
  Filled 2017-03-06 (×3): qty 10

## 2017-03-06 MED ORDER — NOREPINEPHRINE BITARTRATE 1 MG/ML IV SOLN
0.0000 ug/min | INTRAVENOUS | Status: DC
  Administered 2017-03-07: 8 ug/min via INTRAVENOUS
  Filled 2017-03-06: qty 16

## 2017-03-06 MED ORDER — MAGNESIUM SULFATE 2 GM/50ML IV SOLN
2.0000 g | Freq: Once | INTRAVENOUS | Status: AC
  Administered 2017-03-06: 2 g via INTRAVENOUS
  Filled 2017-03-06: qty 50

## 2017-03-06 MED ORDER — METHYLPREDNISOLONE SODIUM SUCC 125 MG IJ SOLR
50.0000 mg | Freq: Four times a day (QID) | INTRAMUSCULAR | Status: DC
  Administered 2017-03-06 – 2017-03-07 (×4): 50 mg via INTRAVENOUS
  Filled 2017-03-06 (×4): qty 2

## 2017-03-06 MED ORDER — POTASSIUM PHOSPHATES 15 MMOLE/5ML IV SOLN
15.0000 mmol | Freq: Once | INTRAVENOUS | Status: AC
  Administered 2017-03-06: 15 mmol via INTRAVENOUS
  Filled 2017-03-06: qty 5

## 2017-03-06 MED ORDER — SODIUM GLYCEROPHOSPHATE 1 MMOLE/ML IV SOLN
20.0000 mmol | Freq: Once | INTRAVENOUS | Status: AC
  Administered 2017-03-06: 20 mmol via INTRAVENOUS
  Filled 2017-03-06: qty 20

## 2017-03-06 MED ORDER — PANTOPRAZOLE SODIUM 40 MG IV SOLR
40.0000 mg | Freq: Two times a day (BID) | INTRAVENOUS | Status: DC
  Administered 2017-03-06 – 2017-03-08 (×4): 40 mg via INTRAVENOUS
  Filled 2017-03-06 (×4): qty 40

## 2017-03-06 NOTE — Progress Notes (Signed)
Pharmacy Electrolyte Monitoring Consult:  Pharmacy consulted to assist in monitoring and replacing electrolytes in this 83 y.o. male admitted on 02/28/2017 with Respiratory Distress Patient is currently receiving CRRT with ultrafiltration.   Labs:  Sodium (mmol/L)  Date Value  03/06/2017 129 (L)  12/05/2013 139   Potassium (mmol/L)  Date Value  03/06/2017 3.9  12/05/2013 4.2   Magnesium (mg/dL)  Date Value  03/06/2017 1.6 (L)  03/29/2013 2.2   Phosphorus (mg/dL)  Date Value  03/06/2017 2.1 (L)   Calcium (mg/dL)  Date Value  03/06/2017 7.0 (L)   Calcium, Total (mg/dL)  Date Value  12/05/2013 8.8   Albumin (g/dL)  Date Value  03/06/2017 1.8 (L)  12/05/2013 3.2 (L)    Plan: Magnesium sulfate 2 g IV once along with KPhos 15 mmol iv once. Will f/u next renal panel.  Ulice Dash D 03/06/2017 12:04 PM

## 2017-03-06 NOTE — Progress Notes (Signed)
1000 Foley temperature 96.9 . Warming blanket turned on to moderate temperature.

## 2017-03-06 NOTE — Progress Notes (Addendum)
Pharmacy Electrolyte Monitoring Consult:  Pharmacy consulted to assist in monitoring and replacing electrolytes in this 83 y.o. male admitted on 03/13/2017 with Respiratory Distress Patient is currently receiving CRRT with ultrafiltration.   Labs:  Sodium (mmol/L)  Date Value  03/06/2017 129 (L)  12/05/2013 139   Potassium (mmol/L)  Date Value  03/06/2017 4.0  12/05/2013 4.2   Magnesium (mg/dL)  Date Value  03/06/2017 1.6 (L)  03/29/2013 2.2   Phosphorus (mg/dL)  Date Value  03/06/2017 2.2 (L)   Calcium (mg/dL)  Date Value  03/06/2017 7.3 (L)   Calcium, Total (mg/dL)  Date Value  12/05/2013 8.8   Albumin (g/dL)  Date Value  03/06/2017 1.8 (L)  12/05/2013 3.2 (L)    Plan: Magnesium sulfate 2 g IV once along with KPhos 15 mmol iv once. Will f/u next renal panel.  12/10 2100 PO4 2.2 Glycophos 20 mmol x1 ordered. Recheck at next interval.  12/11 0300 PO4 2.3. Earlier phosphate supplement still infusing . No new supplementation ordered. Recheck at next interval.  Sharion Grieves S 03/06/2017 11:17 PM

## 2017-03-06 NOTE — Progress Notes (Signed)
Pt. Placed on psv after sedation vacation for 10 min. Pt. Was unable to sustain weaning . rr from 4 to 10 periodically sat down to low 80's at the end of trial.Placed pt. Back to prvc mode .

## 2017-03-06 NOTE — Consult Note (Signed)
PULMONARY / CRITICAL CARE MEDICINE   Name: Kyle Vasquez MRN: [id number] DOB: [birth year]    ADMISSION DATE:  03/10/2017   CONSULTATION DATE:  02/28/2017  REFERRING MD:  Dr. Darvin Neighbours  REASON: Cardiac arrest  HISTORY OF PRESENT ILLNESS:   Patient with multiorgan failure On full ventilatory support On multiple vasopressors On CRRT Patient is DNR Patient is intubated and sedated Patient remains critically ill with severe respiratory failure  Review of systems unobtainable due to critical illness  VITAL SIGNS: BP (!) 94/57   Pulse 79   Temp 98.2 F (36.8 C)   Resp (!) 24   Ht 5\' 6"  (1.676 m)   Wt 221 lb 1.9 oz (100.3 kg)   SpO2 96%   BMI 35.69 kg/m   HEMODYNAMICS:    VENTILATOR SETTINGS: Vent Mode: PRVC FiO2 (%):  [40 %-50 %] 40 % Set Rate:  [20 bmp-24 bmp] 24 bmp Vt Set:  [600 mL] 600 mL PEEP:  [5 cmH20] 5 cmH20  INTAKE / OUTPUT: I/O last 3 completed shifts: In: 12027 [I.V.:10970.3; IV Piggyback:1056.7] Out: 1688 [Urine:35; Emesis/NG output:50; SJGGE:3662] PHYSICAL EXAMINATION: General:  Sedated, NAD Neuro: Biting ETT, withdraws to pain HEENT: PERRLA, edentulous, neck is supple without JVD Cardiovascular: Irregular, S1/S2, no MRG, +2 pulses, no edema Lungs: Bilateral breath sounds, no wheezing Abdomen: non-distended, normal BS X4 Musculoskeletal:  +ROM, no deformities Skin:  Cold and clammy, pale   LABS:  BMET Recent Labs  Lab 03/05/17 2102 03/06/17 0239 03/06/17 0351  NA 129* 129* 128*  K 3.9 3.7 3.8  CL 100* 100* 100*  CO2 21* 22 22  BUN 21* 19 20  CREATININE 1.55* 1.38* 1.44*  GLUCOSE 100* 104* 106*    Electrolytes Recent Labs  Lab 03/04/17 0608  03/05/17 0523  03/05/17 2102 03/06/17 0239 03/06/17 0351  CALCIUM 7.5*   < >  --    < > 7.0* 7.0* 7.1*  MG 2.1  --  1.5*  --   --   --  1.6*  PHOS 4.5   < >  --    < > 2.4* 2.0* 2.1*   < > = values in this interval not displayed.    CBC Recent Labs  Lab 03/04/17 0331 03/04/17 0608  03/05/17 0523  WBC 1.7* 2.2* 2.7*  HGB 6.6* 9.2* 8.4*  HCT 20.4* 27.5* 25.6*  PLT 70* 110* 82*    Coag's Recent Labs  Lab 03/04/17 0608  03/05/17 0523 03/05/17 0843 03/06/17 0351  APTT 48*  --  53*  --  59*  INR 1.93   < > 1.65 1.65 1.87   < > = values in this interval not displayed.    Sepsis Markers Recent Labs  Lab 03/05/2017 2138 03/04/17 0331 03/04/17 0608 03/05/17 0523  LATICACIDVEN  --  2.9* 1.9  --   PROCALCITON 0.46 0.45  --  8.53    ABG Recent Labs  Lab 03/04/17 0604 03/04/17 1104 03/05/17 0809  PHART 7.20* 7.20* 7.23*  PCO2ART 43 39 60*  PO2ART 218* 74* 47*    Liver Enzymes Recent Labs  Lab 03/17/2017 1827 03/04/17 0331 03/04/17 0608  03/05/17 1434 03/05/17 2102 03/06/17 0239  AST 82* 91* 110*  --   --   --   --   ALT 52 51 60  --   --   --   --   ALKPHOS 73 61 67  --   --   --   --   BILITOT 0.5 0.7 0.9  --   --   --   --  ALBUMIN 2.6* 2.2* 2.3*   < > 1.9* 1.9* 1.7*   < > = values in this interval not displayed.    Cardiac Enzymes Recent Labs  Lab 03/12/2017 1827 03/04/17 0608  TROPONINI 0.03* 0.38*    Glucose Recent Labs  Lab 03/05/17 1324 03/05/17 1447 03/05/17 1637 03/05/17 1955 03/06/17 0110 03/06/17 0413  GLUCAP 85 87 86 84 98 96    Imaging Dg Chest Port 1 View  Result Date: 03/05/2017 CLINICAL DATA:  On vent; hx/o a-fib, CKD, COPD, CAD, and stroke; former smoker EXAM: PORTABLE CHEST 1 VIEW COMPARISON:  03/04/2017 FINDINGS: Bilateral interstitial airspace lung opacities, greater on the right, are without significant change from the previous day's study. Bilateral pleural effusions are stable. No new lung abnormalities. No pneumothorax. Endotracheal tube and left internal jugular dual-lumen central venous line are stable. IMPRESSION: 1. No significant change from the previous day's study. 2. Persistent, right greater than left lung opacities and bilateral pleural effusions. 3. Support apparatus stable and well positioned.  Electronically Signed   By: Lajean Manes M.D.   On: 03/05/2017 10:15   STUDIES:  2-D echo pending  CULTURES: Blood cultures x 2 Urine culture Respiratory   ANTIBIOTICS: Ceftriaxone Azithromycin SIGNIFICANT EVENTS: 12/07>a=cardiac arrest>admitted  LINES/TUBES: 12/07 PIVs ETT Foley  DISCUSSION: 83 Y/O male presenting with cardiac arrest, community acquired pneumonia, cardiogenic/hypovolemic shock, acute blood loss anemia, coumadin induced coagulopathy, acute on chronic renal failure, thrombocytopenia, neutropenia and hyperkalemia  ASSESSMENT / PLAN:  PULMONARY A: Severe  acute respiratory failure secondary to cardiac arrest Acute pulmonary edema Community acquired pneumonia Severe metabolic acidosis P:   Full vent support with current settings VAP protocol Nebulized bronchodilators ABG and CXR as needed Abx for CAP as above Bicarb infusion and vent changes  CARDIOVASCULAR A:  Cardiac arrest Cardiogenic/hypovolemic shock H/O Afib P:  Hemodynamic monitoring per ICU protocol CVP Q4H IV fluids HR control with prn metoprolol Pressors to maintain mean arterial blood pressure less than 65 Transfuse per protocol  RENAL A:   Acute on chronic renal failure Hyperkalemia P:   IV fluids Monitor and correct electrolytes Nephrology consult Patient may require CRRT  GASTROINTESTINAL A:   Acute GI bleed P:   GI consulted; we will probably scope patient today Protonix infusion Blood products as needed  HEMATOLOGIC A:   Acute blood loss anemia Coumadin induced coagulaopathy P:  Kcentra DC Coumadin Trend PT/INR Transfuse blood products as needed  INFECTIOUS A:   CAP Sepsis-urinary versus pulmonary source P:   Antibiotics as above Follow-up cultures  ENDOCRINE A:   H/o Hypothyroidism   P:   Resume home medications Monitor blood glucose and treat hypoglycemia  NEUROLOGIC A:   Acute metabolic encephalopathy 2/2 sepsis and cardiac arrest P:    RASS goal: 0 to -1 Monitor neurological status Fentanyl and as needed Versed for vent sedation and discomfort  Patient currently is DNR Patient with multiorgan failure Prognosis is very poor   Critical Care Time devoted to patient care services described in this note is 45 minutes.   Overall, patient is critically ill, prognosis is guarded.  Patient with Multiorgan failure and at high risk for cardiac arrest and death.  Patient remains on vasopressors and ventilatory support unable to wean from ventilator today  Corrin Parker, M.D.  [facility]  Medical Director [place] Director [facility] Cardio-Pulmonary Department

## 2017-03-06 NOTE — Progress Notes (Signed)
*  PRELIMINARY RESULTS* Echocardiogram 2D Echocardiogram has been performed.  Kyle Vasquez 03/06/2017, 9:12 AM

## 2017-03-06 NOTE — Progress Notes (Signed)
Patient ID: Nori Poland, male   DOB: [birth year], 83 y.o.   MRN: [id number]   [employer] PROGRESS NOTE  Bria Portales LSL:[id number] DOB: 06/16/1931 DOA: 03/19/2017 PCP: Tracie Harrier, MD  HPI/Subjective: Patient intubated and now off sedation.  Able to wiggle toes and squeeze with his right hand.  Objective: Vitals:   03/06/17 1454 03/06/17 1500  BP:  (!) 95/56  Pulse: 79 79  Resp: (!) 22 (!) 24  Temp: 97.8 F (36.6 C)   SpO2: 98% 97%    Filed Weights   03/02/2017 2130 03/22/2017 2232 03/06/17 0500  Weight: 81.5 kg (179 lb 10.8 oz) 71.2 kg (156 lb 15.5 oz) 100.3 kg (221 lb 1.9 oz)    ROS: Review of Systems  Unable to perform ROS: Acuity of condition   Exam: Physical Exam  Constitutional: He appears lethargic. He is intubated.  HENT:  Nose: No mucosal edema.  Unable to look into mouth  Eyes: Conjunctivae and lids are normal.  Pupils pinpoint.  Neck: Carotid bruit is not present. No thyromegaly present.  Cardiovascular: S1 normal, S2 normal and normal heart sounds.  Pulses:      Dorsalis pedis pulses are 1+ on the right side, and 1+ on the left side.  Respiratory: He is intubated. He has decreased breath sounds in the right lower field and the left lower field. He has rhonchi in the right middle field, the right lower field and the left lower field.  GI: Soft. Bowel sounds are decreased. There is no tenderness.  Genitourinary:  Genitourinary Comments: Positive for scrotal swelling  Musculoskeletal:       Right ankle: He exhibits swelling.       Left ankle: He exhibits swelling.  Neurological: He appears lethargic.  Skin: Skin is dry. No rash noted. Nails show no clubbing.  Psychiatric:  Patient able to follow simple commands wiggling toes and squeezing with his right hand only      Data Reviewed: Basic Metabolic Panel: Recent Labs  Lab 03/16/2017 2138 03/24/2017 2320  03/04/17 0608  03/05/17 0523  03/05/17 1434 03/05/17 2102 03/06/17 0239  03/06/17 0351 03/06/17 0931  NA  --  138   < > 140   < >  --    < > 131* 129* 129* 128* 129*  K  --  5.9*   < > 5.6*   < >  --    < > 4.1 3.9 3.7 3.8 3.9  CL  --  117*   < > 118*   < >  --    < > 101 100* 100* 100* 100*  CO2  --  15*   < > 17*   < >  --    < > 22 21* '22 22 23  '$ GLUCOSE  --  95   < > 41*   < >  --    < > 92 100* 104* 106* 101*  BUN  --  72*   < > 65*   < >  --    < > 24* 21* '19 20 19  '$ CREATININE  --  3.76*   < > 3.69*   < >  --    < > 1.59* 1.55* 1.38* 1.44* 1.41*  CALCIUM  --  8.1*   < > 7.5*   < >  --    < > 7.0* 7.0* 7.0* 7.1* 7.0*  MG 2.5* 2.4  --  2.1  --  1.5*  --   --   --   --  1.6*  --   PHOS 4.0 4.2  --  4.5   < >  --    < > 2.6 2.4* 2.0* 2.1* 2.1*   < > = values in this interval not displayed.   Liver Function Tests: Recent Labs  Lab 02/28/2017 1827 03/04/17 0331 03/04/17 7619  03/05/17 0843 03/05/17 1434 03/05/17 2102 03/06/17 0239 03/06/17 0931  AST 82* 91* 110*  --   --   --   --   --   --   ALT 52 51 60  --   --   --   --   --   --   ALKPHOS 73 61 67  --   --   --   --   --   --   BILITOT 0.5 0.7 0.9  --   --   --   --   --   --   PROT 6.0* 5.1* 5.6*  --   --   --   --   --   --   ALBUMIN 2.6* 2.2* 2.3*   < > 1.9* 1.9* 1.9* 1.7* 1.8*   < > = values in this interval not displayed.   CBC: Recent Labs  Lab 03/11/2017 1827 03/04/17 0102 03/04/17 0331 03/04/17 0608 03/05/17 0523 03/06/17 0931  WBC 2.2* 1.9* 1.7* 2.2* 2.7*  --   NEUTROABS 1.9  --   --   --   --   --   HGB 6.7* 6.9* 6.6* 9.2* 8.4* 8.6*  HCT 21.2* 20.9* 20.4* 27.5* 25.6* 25.8*  MCV 96.4 94.0 96.7 91.8 91.5  --   PLT 77* 74* 70* 110* 82*  --    Cardiac Enzymes: Recent Labs  Lab 03/08/2017 1827 03/04/17 0608  TROPONINI 0.03* 0.38*    CBG: Recent Labs  Lab 03/05/17 1447 03/05/17 1637 03/05/17 1955 03/06/17 0110 03/06/17 0413  GLUCAP 87 86 84 98 96    Recent Results (from the past 240 hour(s))  MRSA PCR Screening     Status: None   Collection Time: 03/11/2017  9:15 PM   Result Value Ref Range Status   MRSA by PCR NEGATIVE NEGATIVE Final    Comment:        The GeneXpert MRSA Assay (FDA approved for NASAL specimens only), is one component of a comprehensive MRSA colonization surveillance program. It is not intended to diagnose MRSA infection nor to guide or monitor treatment for MRSA infections.   Culture, blood (Routine X 2) w Reflex to ID Panel     Status: None (Preliminary result)   Collection Time: 02/25/2017  9:39 PM  Result Value Ref Range Status   Specimen Description BLOOD LEFT HAND  Final   Special Requests   Final    BOTTLES DRAWN AEROBIC AND ANAEROBIC Blood Culture adequate volume   Culture NO GROWTH 3 DAYS  Final   Report Status PENDING  Incomplete  Culture, blood (Routine X 2) w Reflex to ID Panel     Status: None (Preliminary result)   Collection Time: 03/20/2017  9:43 PM  Result Value Ref Range Status   Specimen Description BLOOD RIGHT HAND  Final   Special Requests   Final    BOTTLES DRAWN AEROBIC AND ANAEROBIC Blood Culture adequate volume   Culture NO GROWTH 3 DAYS  Final   Report Status PENDING  Incomplete     Studies: Dg Chest Port 1 View  Result Date: 03/05/2017 CLINICAL DATA:  On vent; hx/o a-fib, CKD, COPD, CAD, and  stroke; former smoker EXAM: PORTABLE CHEST 1 VIEW COMPARISON:  03/04/2017 FINDINGS: Bilateral interstitial airspace lung opacities, greater on the right, are without significant change from the previous day's study. Bilateral pleural effusions are stable. No new lung abnormalities. No pneumothorax. Endotracheal tube and left internal jugular dual-lumen central venous line are stable. IMPRESSION: 1. No significant change from the previous day's study. 2. Persistent, right greater than left lung opacities and bilateral pleural effusions. 3. Support apparatus stable and well positioned. Electronically Signed   By: Lajean Manes M.D.   On: 03/05/2017 10:15    Scheduled Meds: . chlorhexidine gluconate (MEDLINE KIT)  15  mL Mouth Rinse BID  . mouth rinse  15 mL Mouth Rinse 10 times per day  . methylPREDNISolone (SOLU-MEDROL) injection  50 mg Intravenous Q6H  . sodium chloride flush  3 mL Intravenous Q12H   Continuous Infusions: . cefTRIAXone (ROCEPHIN)  IV Stopped (03/06/17 1239)  . dextrose 125 mL/hr (03/06/17 1257)  . fentaNYL infusion INTRAVENOUS Stopped (03/06/17 0800)  . norepinephrine (LEVOPHED) Adult infusion 12 mcg/min (03/06/17 1452)  . pantoprozole (PROTONIX) infusion 8 mg/hr (03/06/17 1342)  . phenylephrine (NEO-SYNEPHRINE) Adult infusion Stopped (03/06/17 1016)  . potassium PHOSPHATE IVPB (mmol) 15 mmol (03/06/17 0955)  . pureflow 1 mL (03/06/17 1147)  . vasopressin (PITRESSIN) infusion - *FOR SHOCK* 0.03 Units/min (03/06/17 0600)    Assessment/Plan:  1. Acute cardiac arrest.  Supportive care.  Calcified mass on mitral valve could be vegetation versus calcified leaflet. 2. Hypovolemic shock versus cardiogenic shock.  Patient on 2 pressors to maintain blood pressure.  On stress dose steroids. 3. Acute hypoxic respiratory failure.  Continue ventilatory support.  Patient on 35% FiO2. 4. Acute anuric renal failure.  Patient on CRRT 5. Acute blood loss anemia with supratherapeutic INR.  Patient received 2 units of packed red blood cells with good response.  Patient received vitamin K and K Centra.  Last hemoglobin 8.6 6. Metabolic acidosis.  Continue vent management and CRRT 7. Clinical sepsis with pneumonia on empiric antibiotics.  8. History of atrial fibrillation.  Holding all meds with hypotension and GI bleed. 9. History of BPH.  Holding Flomax with hypotension 10. Hyperlipidemia unspecified holding Crestor 11. Hypothyroidism unspecified holding levothyroxine. 12. Hypoglycemia.  Patient on D10 drip. 13. Hypothermia.  Patient on bear hugger. 14. Pancytopenia probably secondary to sepsis 15. Pharmacy doing electrolyte protocol to replace electrolytes  Code Status:     Code Status  Orders  (From admission, onward)        Start     Ordered   03/04/17 0955  Do not attempt resuscitation (DNR)  Continuous    Question Answer Comment  In the event of cardiac or respiratory ARREST Do not call a "code blue"   In the event of cardiac or respiratory ARREST Do not perform Intubation, CPR, defibrillation or ACLS   In the event of cardiac or respiratory ARREST Use medication by any route, position, wound care, and other measures to relive pain and suffering. May use oxygen, suction and manual treatment of airway obstruction as needed for comfort.      03/04/17 0954    Code Status History    Date Active Date Inactive Code Status Order ID Comments User Context   03/24/2017 19:38 03/04/2017 09:54 Full Code [id number]  Hillary Bow, MD ED   01/27/2017 20:17 01/28/2017 20:34 DNR [id number]  Max Sane, MD ED   12/12/2016 15:56 12/14/2016 20:28 DNR [id number]  Vaughan Basta, MD Inpatient   09/19/2016 19:02 09/21/2016  19:55 Full Code [id number]  Bettey Costa, MD Inpatient   07/20/2016 16:50 07/21/2016 16:51 Full Code [id number]  Isaias Cowman, MD Inpatient   06/29/2016 19:37 07/01/2016 21:03 Full Code [id number]  Gladstone Lighter, MD Inpatient   09/07/2015 21:31 09/08/2015 19:58 Full Code [id number]  Quintella Baton, MD Inpatient    Advance Directive Documentation     Most Recent Value  Type of Advance Directive  Living will  Pre-existing out of facility DNR order (yellow form or pink MOST form)  No data  "MOST" Form in Place?  No data     Family Communication: As per critical care specialist.  Spoke with son yesterday Disposition Plan: To be determined based on clinical course.  Consultants:  Critical care specialist  Nephrology  Cardiology  Antibiotics:  Vancomycin  Zosyn  Time spent: 28 minutes.  Overall prognosis is poor.   Teryn Gust [employer]

## 2017-03-06 NOTE — Progress Notes (Signed)
[place], [place] 03/06/17  Subjective:  Patient seen at bedside. Still remains critically ill. He continues on CRRT at this time. Patient remains on multiple pressors.  Objective:  Vital signs in last 24 hours:  Temp:  [96.8 F (36 C)-99 F (37.2 C)] 97.3 F (36.3 C) (12/10 0900) Pulse Rate:  [74-81] 79 (12/10 1000) Resp:  [5-30] 24 (12/10 1000) BP: (94-142)/(57-75) 110/63 (12/10 1000) SpO2:  [92 %-98 %] 94 % (12/10 1000) Arterial Line BP: (97-188)/(48-80) 120/52 (12/10 0900) FiO2 (%):  [35 %-50 %] 40 % (12/10 0820) Weight:  [100.3 kg (221 lb 1.9 oz)] 100.3 kg (221 lb 1.9 oz) (12/10 0500)  Weight change:  Filed Weights   03/05/2017 2130 03/08/2017 2232 03/06/17 0500  Weight: 81.5 kg (179 lb 10.8 oz) 71.2 kg (156 lb 15.5 oz) 100.3 kg (221 lb 1.9 oz)    Intake/Output:    Intake/Output Summary (Last 24 hours) at 03/06/2017 1117 Last data filed at 03/06/2017 1000 Gross per 24 hour  Intake 5642.61 ml  Output 1148 ml  Net 4494.61 ml     Physical Exam: General:  Critically ill appearing  HEENT  ET tube, OG tube in place  Neck   supple  Pulm/lungs  scattered rhonchi, vent assisted  CVS/Heart  regular rhythm, no rubs  Abdomen:   soft, mild distension, scant BS  Extremities:  trace b/l LE edema  Neurologic:  Sedated  Skin:  No acute rashes  Dialysis Access:  IJ temporary dialysis access       Basic Metabolic Panel:  Recent Labs  Lab 03/24/2017 2138 03/02/2017 2320  03/04/17 0608  03/05/17 0523  03/05/17 1434 03/05/17 2102 03/06/17 0239 03/06/17 0351 03/06/17 0931  NA  --  138   < > 140   < >  --    < > 131* 129* 129* 128* 129*  K  --  5.9*   < > 5.6*   < >  --    < > 4.1 3.9 3.7 3.8 3.9  CL  --  117*   < > 118*   < >  --    < > 101 100* 100* 100* 100*  CO2  --  15*   < > 17*   < >  --    < > 22 21* '22 22 23  '$ GLUCOSE  --  95   < > 41*   < >  --    < > 92 100* 104* 106* 101*  BUN  --  72*   < > 65*   < >  --    < > 24* 21* '19 20 19   '$ CREATININE  --  3.76*   < > 3.69*   < >  --    < > 1.59* 1.55* 1.38* 1.44* 1.41*  CALCIUM  --  8.1*   < > 7.5*   < >  --    < > 7.0* 7.0* 7.0* 7.1* 7.0*  MG 2.5* 2.4  --  2.1  --  1.5*  --   --   --   --  1.6*  --   PHOS 4.0 4.2  --  4.5   < >  --    < > 2.6 2.4* 2.0* 2.1* 2.1*   < > = values in this interval not displayed.     CBC: Recent Labs  Lab 03/06/2017 1827 03/04/17 0102 03/04/17 0331 03/04/17 0608 03/05/17 0523 03/06/17 0931  WBC 2.2* 1.9* 1.7*  2.2* 2.7*  --   NEUTROABS 1.9  --   --   --   --   --   HGB 6.7* 6.9* 6.6* 9.2* 8.4* 8.6*  HCT 21.2* 20.9* 20.4* 27.5* 25.6* 25.8*  MCV 96.4 94.0 96.7 91.8 91.5  --   PLT 77* 74* 70* 110* 82*  --      No results found for: HEPBSAG, HEPBSAB, HEPBIGM    Microbiology:  Recent Results (from the past 240 hour(s))  MRSA PCR Screening     Status: None   Collection Time: 03/10/2017  9:15 PM  Result Value Ref Range Status   MRSA by PCR NEGATIVE NEGATIVE Final    Comment:        The GeneXpert MRSA Assay (FDA approved for NASAL specimens only), is one component of a comprehensive MRSA colonization surveillance program. It is not intended to diagnose MRSA infection nor to guide or monitor treatment for MRSA infections.   Culture, blood (Routine X 2) w Reflex to ID Panel     Status: None (Preliminary result)   Collection Time: 03/25/2017  9:39 PM  Result Value Ref Range Status   Specimen Description BLOOD LEFT HAND  Final   Special Requests   Final    BOTTLES DRAWN AEROBIC AND ANAEROBIC Blood Culture adequate volume   Culture NO GROWTH 3 DAYS  Final   Report Status PENDING  Incomplete  Culture, blood (Routine X 2) w Reflex to ID Panel     Status: None (Preliminary result)   Collection Time: 03/11/2017  9:43 PM  Result Value Ref Range Status   Specimen Description BLOOD RIGHT HAND  Final   Special Requests   Final    BOTTLES DRAWN AEROBIC AND ANAEROBIC Blood Culture adequate volume   Culture NO GROWTH 3 DAYS  Final   Report  Status PENDING  Incomplete    Coagulation Studies: Recent Labs    03/04/17 1048 03/04/17 1649 03/05/17 0523 03/05/17 0843 03/06/17 0351  LABPROT 21.3* 20.2* 19.4* 19.4* 21.4*  INR 1.86 1.74 1.65 1.65 1.87    Urinalysis: No results for input(s): COLORURINE, LABSPEC, PHURINE, GLUCOSEU, HGBUR, BILIRUBINUR, KETONESUR, PROTEINUR, UROBILINOGEN, NITRITE, LEUKOCYTESUR in the last 72 hours.  Invalid input(s): APPERANCEUR    Imaging: Dg Chest Port 1 View  Result Date: 03/05/2017 CLINICAL DATA:  On vent; hx/o a-fib, CKD, COPD, CAD, and stroke; former smoker EXAM: PORTABLE CHEST 1 VIEW COMPARISON:  03/04/2017 FINDINGS: Bilateral interstitial airspace lung opacities, greater on the right, are without significant change from the previous day's study. Bilateral pleural effusions are stable. No new lung abnormalities. No pneumothorax. Endotracheal tube and left internal jugular dual-lumen central venous line are stable. IMPRESSION: 1. No significant change from the previous day's study. 2. Persistent, right greater than left lung opacities and bilateral pleural effusions. 3. Support apparatus stable and well positioned. Electronically Signed   By: Lajean Manes M.D.   On: 03/05/2017 10:15     Medications:   . cefTRIAXone (ROCEPHIN)  IV    . dextrose 125 mL/hr at 03/06/17 0600  . fentaNYL infusion INTRAVENOUS 100 mcg/hr (03/06/17 0600)  . norepinephrine (LEVOPHED) Adult infusion 25 mcg/min (03/06/17 1040)  . pantoprozole (PROTONIX) infusion 8 mg/hr (03/06/17 0600)  . phenylephrine (NEO-SYNEPHRINE) Adult infusion Stopped (03/06/17 1016)  . potassium PHOSPHATE IVPB (mmol) 15 mmol (03/06/17 0955)  . pureflow 3 each (03/05/17 1827)  . vasopressin (PITRESSIN) infusion - *FOR SHOCK* 0.03 Units/min (03/06/17 0600)   . chlorhexidine gluconate (MEDLINE KIT)  15 mL Mouth Rinse  BID  . mouth rinse  15 mL Mouth Rinse 10 times per day  . methylPREDNISolone (SOLU-MEDROL) injection  50 mg Intravenous Q6H   . sodium chloride flush  3 mL Intravenous Q12H   acetaminophen **OR** acetaminophen, albuterol, bisacodyl, fentaNYL, heparin, midazolam, ondansetron **OR** ondansetron (ZOFRAN) IV, polyethylene glycol, sennosides  Assessment/ Plan:  83 y.o. African American male with Atrial fibrillation, sick sinus syndrome, Pacemaker placement 06/2016, hypertension, hypothyroidism, myelodysplastic syndrome, obstructive sleep apnea, stroke with left-sided weakness, history of syncope, chronic kidney disease stage IV  1.  ARF on CKD st 4 2.  Acute Resp Failure. ARDS 3.  Hyperkalemia 4.  Lactic Acidosis 5.  Coagulopathy  Patient is critically ill.  He has developed oliguric acute renal failure likely due to sepsis and pneumonia.  He has underlying chronic kidney disease stage IV.  His baseline creatinine is 3.8/GFR 15.  Previously an outpatient, according to our discussions, patient's and son and wife had decided for non-dialytic conservative management.  Patient's son at this time wants to try short-term dialysis to see if his illness can be reversed .  We discussed that it is possible that he may recover from pneumonia but might end up on permanent dialysis.  Family understands that.  They want to continue to try aggressive measures  Plan: Patient has been progressing well with continuous renal replacement therapy.  He is still making very little urine and patient also remains critically ill.  As such we will continue the patient on CRRT with ultrafiltration target of 50 cc/h.  Overall the patient continues to have quite significant guarded prognosis.  We will continue to monitor serum electrolytes on a periodic basis.   LOS: 3 Jodilyn Giese 12/10/201811:17 AM  [facility] [place], [place]

## 2017-03-06 NOTE — Progress Notes (Addendum)
1200 Opens eyes at times. Some resistance noted when moving right arm.  Opens eyes to family"s urging. Fentanyl off and Neo-Synephrine also off. Levophed at 23 mcg/min. and Vasopressin continues at 0.003 units/min. Had small stool.Protonix infusing at 8 mg/hour.Family in and out. Temperature remains low and warming remains on. Continuing to wean Levophed.

## 2017-03-06 NOTE — Progress Notes (Signed)
0700 report received from night RN. Unresponsive to pain at 0715. Dr. Cyril Mourning in. Verbal orders received as well as plan for care today. Sedation vacation performed as routine. RT present. B/P elevated as consciousness increased. Neo-Synephrine weaned quickly and Levophed increased as needed.Grip present on right hand very slightly to commands.  No response with left hand. Wiggled right and left toes to command Bilateral eyes flutter at times.Bilateral pupils still very sluggish. CRRT continues as ordered. Weaned to 35% FiO2 on ventilator but only has 3-4 spontaneous breaths when ventilator rate set to 0. Oxygen rated decreased quickly. Back to set rate of 24 FiO2 35% PEEP of 5 and tidal volume 600 are new ventilator settings. Right mouth droop also very prominent when sedation left off. Will monitor and discuss plan of care in rounds.

## 2017-03-06 NOTE — Progress Notes (Signed)
1700 Continuing to wean Levophed. Not as responsive this afternoon. Will not try to grip but does wiggle toes slightly bilaterally. Dr.Kasa updated on patient. Bilateral arms are now weeping. Back,sacrum and scrotal area remain swollen and have wept all day. CRRT continues without issues. Family in and out during the day.

## 2017-03-06 NOTE — Progress Notes (Signed)
Most recent labs called to nephrologist. Potassium trending down each draw. Verbal order to start patient on 4K bath. CRRT cartridge changed, patient changed to 4K bath. Vasopressors titrated down throughout shift. Will continue to monitor.

## 2017-03-06 NOTE — Progress Notes (Signed)
No charge note:   Palliative consult received.   Meeting scheduled for 03/07/2017 @ 1430.  Mariana Kaufman, AGNP-C Palliative Medicine  Please call Palliative Medicine team phone with any questions [phone]. For individual providers please see AMION.

## 2017-03-07 DIAGNOSIS — Z515 Encounter for palliative care: Secondary | ICD-10-CM

## 2017-03-07 DIAGNOSIS — Z7189 Other specified counseling: Secondary | ICD-10-CM

## 2017-03-07 LAB — CBC WITH DIFFERENTIAL/PLATELET
BASOS PCT: 0 %
Basophils Absolute: 0 10*3/uL (ref 0–0.1)
EOS ABS: 0 10*3/uL (ref 0–0.7)
EOS PCT: 1 %
HCT: 25.3 % — ABNORMAL LOW (ref 40.0–52.0)
HEMOGLOBIN: 8.4 g/dL — AB (ref 13.0–18.0)
LYMPHS ABS: 0.1 10*3/uL — AB (ref 1.0–3.6)
Lymphocytes Relative: 1 %
MCH: 30 pg (ref 26.0–34.0)
MCHC: 33.2 g/dL (ref 32.0–36.0)
MCV: 90.3 fL (ref 80.0–100.0)
MONOS PCT: 6 %
Monocytes Absolute: 0.4 10*3/uL (ref 0.2–1.0)
NEUTROS PCT: 92 %
Neutro Abs: 7.1 10*3/uL — ABNORMAL HIGH (ref 1.4–6.5)
PLATELETS: 71 10*3/uL — AB (ref 150–440)
RBC: 2.81 MIL/uL — ABNORMAL LOW (ref 4.40–5.90)
RDW: 18 % — ABNORMAL HIGH (ref 11.5–14.5)
WBC: 7.7 10*3/uL (ref 3.8–10.6)

## 2017-03-07 LAB — RENAL FUNCTION PANEL
ALBUMIN: 1.5 g/dL — AB (ref 3.5–5.0)
ANION GAP: 7 (ref 5–15)
ANION GAP: 7 (ref 5–15)
Albumin: 1.8 g/dL — ABNORMAL LOW (ref 3.5–5.0)
Albumin: 1.7 g/dL — ABNORMAL LOW (ref 3.5–5.0)
Albumin: 1.7 g/dL — ABNORMAL LOW (ref 3.5–5.0)
Anion gap: 5 (ref 5–15)
Anion gap: 6 (ref 5–15)
BUN: 15 mg/dL (ref 6–20)
BUN: 13 mg/dL (ref 6–20)
BUN: 14 mg/dL (ref 6–20)
BUN: 13 mg/dL (ref 6–20)
CALCIUM: 7.5 mg/dL — AB (ref 8.9–10.3)
CALCIUM: 7.2 mg/dL — AB (ref 8.9–10.3)
CALCIUM: 7.4 mg/dL — AB (ref 8.9–10.3)
CHLORIDE: 99 mmol/L — AB (ref 101–111)
CO2: 22 mmol/L (ref 22–32)
CO2: 23 mmol/L (ref 22–32)
CO2: 26 mmol/L (ref 22–32)
CO2: 23 mmol/L (ref 22–32)
CREATININE: 1.33 mg/dL — AB (ref 0.61–1.24)
CREATININE: 1.17 mg/dL (ref 0.61–1.24)
Calcium: 7.4 mg/dL — ABNORMAL LOW (ref 8.9–10.3)
Chloride: 100 mmol/L — ABNORMAL LOW (ref 101–111)
Chloride: 99 mmol/L — ABNORMAL LOW (ref 101–111)
Chloride: 98 mmol/L — ABNORMAL LOW (ref 101–111)
Creatinine, Ser: 1.21 mg/dL (ref 0.61–1.24)
Creatinine, Ser: 1.06 mg/dL (ref 0.61–1.24)
GFR calc Af Amer: 55 mL/min — ABNORMAL LOW (ref >60)
GFR calc Af Amer: >60 mL/min (ref >60)
GFR calc non Af Amer: 54 mL/min — ABNORMAL LOW (ref >60)
GFR calc non Af Amer: 48 mL/min — ABNORMAL LOW (ref >60)
GFR calc non Af Amer: >60 mL/min (ref >60)
GFR, EST NON AFRICAN AMERICAN: 56 mL/min — AB (ref >60)
GLUCOSE: 107 mg/dL — AB (ref 65–99)
GLUCOSE: 181 mg/dL — AB (ref 65–99)
GLUCOSE: 131 mg/dL — AB (ref 65–99)
Glucose, Bld: 109 mg/dL — ABNORMAL HIGH (ref 65–99)
PHOSPHORUS: 2.3 mg/dL — AB (ref 2.5–4.6)
PHOSPHORUS: 2.3 mg/dL — AB (ref 2.5–4.6)
POTASSIUM: 3.8 mmol/L (ref 3.5–5.1)
POTASSIUM: 3.8 mmol/L (ref 3.5–5.1)
POTASSIUM: 3.9 mmol/L (ref 3.5–5.1)
Phosphorus: 1.9 mg/dL — ABNORMAL LOW (ref 2.5–4.6)
Phosphorus: 2.5 mg/dL (ref 2.5–4.6)
Potassium: 3.7 mmol/L (ref 3.5–5.1)
SODIUM: 129 mmol/L — AB (ref 135–145)
SODIUM: 127 mmol/L — AB (ref 135–145)
SODIUM: 128 mmol/L — AB (ref 135–145)
Sodium: 131 mmol/L — ABNORMAL LOW (ref 135–145)

## 2017-03-07 LAB — BLOOD GAS, ARTERIAL
ACID-BASE DEFICIT: 11.9 mmol/L — AB (ref 0.0–2.0)
Bicarbonate: 15.2 mmol/L — ABNORMAL LOW (ref 20.0–28.0)
FIO2: 0.5
LHR: 20 {breaths}/min
O2 SAT: 90.7 %
PATIENT TEMPERATURE: 37
PCO2 ART: 39 mmHg (ref 32.0–48.0)
PEEP/CPAP: 5 cmH2O
VT: 600 mL
pH, Arterial: 7.2 — ABNORMAL LOW (ref 7.350–7.450)
pO2, Arterial: 74 mmHg — ABNORMAL LOW (ref 83.0–108.0)

## 2017-03-07 LAB — APTT: aPTT: 68 seconds — ABNORMAL HIGH (ref 24–36)

## 2017-03-07 LAB — MAGNESIUM: MAGNESIUM: 1.8 mg/dL (ref 1.7–2.4)

## 2017-03-07 MED ORDER — HYDROCORTISONE NA SUCCINATE PF 100 MG IJ SOLR
50.0000 mg | Freq: Four times a day (QID) | INTRAMUSCULAR | Status: DC
  Administered 2017-03-07 – 2017-03-08 (×5): 50 mg via INTRAVENOUS
  Filled 2017-03-07 (×5): qty 2

## 2017-03-07 MED ORDER — POTASSIUM PHOSPHATES 15 MMOLE/5ML IV SOLN
30.0000 mmol | Freq: Once | INTRAVENOUS | Status: AC
  Administered 2017-03-07: 30 mmol via INTRAVENOUS
  Filled 2017-03-07: qty 10

## 2017-03-07 NOTE — Progress Notes (Signed)
Patient unable to follow simple commands. Versed given x3. Patient will bite down on tube and become agitated. Tolerated CRRT, urine output 5 ml for my 12 hour shift. Patient remains on warming blanket. Family had conference with palliative care. Per palliative family wishes to transition to comfort care tomorrow.

## 2017-03-07 NOTE — Progress Notes (Addendum)
Pharmacy Electrolyte Monitoring Consult/CRRT Medication Adjusment  Pharmacy consulted to assist in monitoring and replacing electrolytes in this 83 y.o. male admitted on 03/23/2017 with Respiratory Distress Patient is currently receiving CRRT with ultrafiltration.   Labs:  Sodium (mmol/L)  Date Value  03/07/2017 127 (L)  12/05/2013 139   Potassium (mmol/L)  Date Value  03/07/2017 3.7  12/05/2013 4.2   Magnesium (mg/dL)  Date Value  03/07/2017 1.8  03/29/2013 2.2   Phosphorus (mg/dL)  Date Value  03/07/2017 2.3 (L)   Calcium (mg/dL)  Date Value  03/07/2017 7.2 (L)   Calcium, Total (mg/dL)  Date Value  12/05/2013 8.8   Albumin (g/dL)  Date Value  03/07/2017 1.5 (L)  12/05/2013 3.2 (L)    Plan: 1. Phos level low but labs obtained before or only shortly after replacement finished infusing. Will f/u PM renal function panel and replace as necessary.   2. No medications require adjustment for CRRT at present.   Ulice Dash D 03/07/2017 2:49 PM                      Phos= 1.9. Will order Kphos 30 mmol and continue to follow labs.   Ulice Dash, PharmD Clinical Pharmacist

## 2017-03-07 NOTE — Progress Notes (Signed)
Pharmacy Electrolyte Monitoring Consult/CRRT Medication Adjusment  Pharmacy consulted to assist in monitoring and replacing electrolytes in this 83 y.o. male admitted on 03/17/2017 with Respiratory Distress Patient is currently receiving CRRT with ultrafiltration.   Labs:  Sodium (mmol/L)  Date Value  03/07/2017 128 (L)  12/05/2013 139   Potassium (mmol/L)  Date Value  03/07/2017 3.9  12/05/2013 4.2   Magnesium (mg/dL)  Date Value  03/07/2017 1.8  03/29/2013 2.2   Phosphorus (mg/dL)  Date Value  03/07/2017 2.5   Calcium (mg/dL)  Date Value  03/07/2017 7.4 (L)   Calcium, Total (mg/dL)  Date Value  12/05/2013 8.8   Albumin (g/dL)  Date Value  03/07/2017 1.7 (L)  12/05/2013 3.2 (L)    Plan: 1. Phos level low but labs obtained before or only shortly after replacement finished infusing. Will f/u PM renal function panel and replace as necessary.   2. No medications require adjustment for CRRT at present.   Galen Malkowski D 03/07/2017 9:52 PM                      Phos= 1.9. Will order Kphos 30 mmol and continue to follow labs.   12/11 @ 21:00 = electrolytes WNL, will recheck electrolytes on 12/12 with AM labs.   Geyser D Clinical Pharmacist

## 2017-03-07 NOTE — Consult Note (Signed)
PULMONARY / CRITICAL CARE MEDICINE   Name: Kyle Vasquez MRN: [id number] DOB: 1931-09-04    ADMISSION DATE:  03/10/2017   CONSULTATION DATE:  03/06/2017  REFERRING MD:  Dr. Darvin Neighbours  REASON: Cardiac arrest  HISTORY OF PRESENT ILLNESS:   Patient with multiorgan failure On full ventilatory support On CRRT Patient is DNR Patient is intubated and sedated Patient remains critically ill with severe respiratory failure  Review of systems unobtainable due to critical illness  VITAL SIGNS: BP 126/73   Pulse 80   Temp (!) 97.3 F (36.3 C)   Resp 17   Ht 5\' 6"  (1.676 m)   Wt 235 lb 0.2 oz (106.6 kg)   SpO2 97%   BMI 37.93 kg/m   HEMODYNAMICS:    VENTILATOR SETTINGS: Vent Mode: PRVC FiO2 (%):  [35 %-40 %] 35 % Set Rate:  [24 bmp] 24 bmp Vt Set:  [600 mL] 600 mL PEEP:  [5 cmH20] 5 cmH20 Pressure Support:  [10 cmH20] 10 cmH20  INTAKE / OUTPUT: I/O last 3 completed shifts: In: 7149.8 [I.V.:6439.8; NG/GT:60; IV Piggyback:650] Out: 5053 [Urine:105; Emesis/NG output:130; ZJQBH:4193] PHYSICAL EXAMINATION: General:  Sedated, NAD Neuro: Biting ETT, withdraws to pain HEENT: PERRLA, edentulous, neck is supple without JVD Cardiovascular: Irregular, S1/S2, no MRG, +2 pulses, no edema Lungs: Bilateral breath sounds, no wheezing Abdomen: non-distended, normal BS X4 Musculoskeletal:  +ROM, no deformities Skin:  Cold and clammy, pale   LABS:  BMET Recent Labs  Lab 03/06/17 1505 03/06/17 2116 03/07/17 0307  NA 129* 129* 129*  K 4.1 4.0 3.8  CL 100* 99* 100*  CO2 22 21* 22  BUN 17 16 15   CREATININE 1.34* 1.31* 1.21  GLUCOSE 122* 122* 107*    Electrolytes Recent Labs  Lab 03/05/17 0523  03/06/17 0351  03/06/17 1505 03/06/17 2116 03/07/17 0307 03/07/17 0446  CALCIUM  --    < > 7.1*   < > 7.1* 7.3* 7.5*  --   MG 1.5*  --  1.6*  --   --   --   --  1.8  PHOS  --    < > 2.1*   < > 2.5 2.2* 2.3*  --    < > = values in this interval not displayed.    CBC Recent  Labs  Lab 03/04/17 0608 03/05/17 0523 03/06/17 0931 03/07/17 0446  WBC 2.2* 2.7*  --  7.7  HGB 9.2* 8.4* 8.6* 8.4*  HCT 27.5* 25.6* 25.8* 25.3*  PLT 110* 82*  --  71*    Coag's Recent Labs  Lab 03/05/17 0523 03/05/17 0843 03/06/17 0351 03/07/17 0446  APTT 53*  --  59* 68*  INR 1.65 1.65 1.87  --     Sepsis Markers Recent Labs  Lab 03/22/2017 2138 03/04/17 0331 03/04/17 0608 03/05/17 0523  LATICACIDVEN  --  2.9* 1.9  --   PROCALCITON 0.46 0.45  --  8.53    ABG Recent Labs  Lab 03/04/17 0604 03/04/17 1104 03/05/17 0809  PHART 7.20* 7.20* 7.23*  PCO2ART 43 39 60*  PO2ART 218* 74* 47*    Liver Enzymes Recent Labs  Lab 02/27/2017 1827 03/04/17 0331 03/04/17 0608  03/06/17 1505 03/06/17 2116 03/07/17 0307  AST 82* 91* 110*  --   --   --   --   ALT 52 51 60  --   --   --   --   ALKPHOS 73 61 67  --   --   --   --  BILITOT 0.5 0.7 0.9  --   --   --   --   ALBUMIN 2.6* 2.2* 2.3*   < > 1.7* 1.8* 1.8*   < > = values in this interval not displayed.    Cardiac Enzymes Recent Labs  Lab 03/14/2017 1827 03/04/17 0608  TROPONINI 0.03* 0.38*    Glucose Recent Labs  Lab 03/05/17 1324 03/05/17 1447 03/05/17 1637 03/05/17 1955 03/06/17 0110 03/06/17 0413  GLUCAP 85 87 86 84 98 96    Imaging No results found. STUDIES:  2-D echo pending  CULTURES: Blood cultures x 2 Urine culture Respiratory   ANTIBIOTICS: Ceftriaxone Azithromycin SIGNIFICANT EVENTS: 12/07>a=cardiac arrest>admitted  LINES/TUBES: 12/07 PIVs ETT Foley  DISCUSSION: 83 Y/O male presenting with cardiac arrest, community acquired pneumonia, cardiogenic/hypovolemic shock, acute blood loss anemia, coumadin induced coagulopathy, acute on chronic renal failure, thrombocytopenia, neutropenia and hyperkalemia  ASSESSMENT / PLAN:  PULMONARY A: Severe  acute respiratory failure secondary to cardiac arrest Acute pulmonary edema Community acquired pneumonia Severe metabolic  acidosis P:   Full vent support with current settings VAP protocol Nebulized bronchodilators ABG and CXR as needed Abx for CAP as above Bicarb infusion and vent changes  CARDIOVASCULAR A:  Cardiac arrest Cardiogenic/hypovolemic shock H/O Afib P:  Hemodynamic monitoring per ICU protocol CVP Q4H IV fluids HR control with prn metoprolol Pressors to maintain mean arterial blood pressure less than 65 Transfuse per protocol  RENAL A:   Acute on chronic renal failure Hyperkalemia P:   IV fluids Monitor and correct electrolytes Nephrology consult Patient may require CRRT  GASTROINTESTINAL A:   Acute GI bleed P:   GI consulted; we will probably scope patient today Protonix infusion Blood products as needed  HEMATOLOGIC A:   Acute blood loss anemia Coumadin induced coagulaopathy P:  Kcentra DC Coumadin Trend PT/INR Transfuse blood products as needed  INFECTIOUS A:   CAP Sepsis-urinary versus pulmonary source P:   Antibiotics as above Follow-up cultures  ENDOCRINE A:   H/o Hypothyroidism   P:   Resume home medications Monitor blood glucose and treat hypoglycemia  NEUROLOGIC A:   Acute metabolic encephalopathy 2/2 sepsis and cardiac arrest P:   RASS goal: 0 to -1 Monitor neurological status Fentanyl and as needed Versed for vent sedation and discomfort  Patient currently is DNR Patient with multiorgan failure Prognosis is very poor   Critical Care Time devoted to patient care services described in this note is 37 minutes.   Overall, patient is critically ill, prognosis is guarded.  Patient with Multiorgan failure and at high risk for cardiac arrest and death.   Patient remains on  ventilatory support unable to wean from ventilator today due to resp muscle weakness and encephalopathy  Corrin Parker, M.D.  [facility]  Medical Director [place] Director [facility] Cardio-Pulmonary Department

## 2017-03-07 NOTE — Progress Notes (Signed)
Patient ID: Kyle Vasquez, male   DOB: Feb 18, 1932, 83 y.o.   MRN: [id number]   [employer] PROGRESS NOTE  Kyle Vasquez MEQ:[id number] DOB: 12/26/1931 DOA: 03/26/2017 PCP: Tracie Harrier, MD  HPI/Subjective: Patient intubated and now off sedation.  Not responsive earlier.  Objective: Vitals:   03/07/17 1200 03/07/17 1300  BP: 99/64 (!) 74/51  Pulse: 81 81  Resp: (!) 21 (!) 24  Temp: 97.9 F (36.6 C) 97.9 F (36.6 C)  SpO2: 94% 95%    Filed Weights   03/25/2017 2232 03/06/17 0500 03/07/17 0500  Weight: 71.2 kg (156 lb 15.5 oz) 100.3 kg (221 lb 1.9 oz) 106.6 kg (235 lb 0.2 oz)    ROS: Review of Systems  Unable to perform ROS: Acuity of condition   Exam: Physical Exam  Constitutional: He is intubated.  HENT:  Nose: No mucosal edema.  Unable to look into mouth  Eyes: Conjunctivae and lids are normal.  Pupils pinpoint.  Neck: Carotid bruit is not present. No thyromegaly present.  Cardiovascular: S1 normal, S2 normal and normal heart sounds.  Pulses:      Dorsalis pedis pulses are 1+ on the right side, and 1+ on the left side.  Respiratory: He is intubated. He has decreased breath sounds in the right lower field and the left lower field. He has rhonchi in the right middle field, the right lower field and the left lower field.  GI: Soft. Bowel sounds are decreased. There is no tenderness.  Genitourinary:  Genitourinary Comments: Positive for scrotal swelling  Musculoskeletal:       Right ankle: He exhibits swelling.       Left ankle: He exhibits swelling.  Neurological: He is unresponsive.  Skin: Skin is dry. No rash noted. Nails show no clubbing.  Psychiatric:  Patient not able to follow commands      Data Reviewed: Basic Metabolic Panel: Recent Labs  Lab 03/11/2017 2320  03/04/17 0608  03/05/17 0523  03/06/17 0351 03/06/17 0931 03/06/17 1505 03/06/17 2116 03/07/17 0307 03/07/17 0446 03/07/17 0806  NA 138   < > 140   < >  --    < > 128* 129* 129*  129* 129*  --  127*  K 5.9*   < > 5.6*   < >  --    < > 3.8 3.9 4.1 4.0 3.8  --  3.7  CL 117*   < > 118*   < >  --    < > 100* 100* 100* 99* 100*  --  99*  CO2 15*   < > 17*   < >  --    < > _0 21* 22  --  23  GLUCOSE 95   < > 41*   < >  --    < > 106* 101* 122* 122* 107*  --  181*  BUN 72*   < > 65*   < >  --    < > _1 --  13  CREATININE 3.76*   < > 3.69*   < >  --    < > 1.44* 1.41* 1.34* 1.31* 1.21  --  1.33*  CALCIUM 8.1*   < > 7.5*   < >  --    < > 7.1* 7.0* 7.1* 7.3* 7.5*  --  7.2*  MG 2.4  --  2.1  --  1.5*  --  1.6*  --   --   --   --  1.8  --   PHOS 4.2  --  4.5   < >  --    < > 2.1* 2.1* 2.5 2.2* 2.3*  --  2.3*   < > = values in this interval not displayed.   Liver Function Tests: Recent Labs  Lab 03/22/2017 1827 03/04/17 0331 03/04/17 1610  03/06/17 0931 03/06/17 1505 03/06/17 2116 03/07/17 0307 03/07/17 0806  AST 82* 91* 110*  --   --   --   --   --   --   ALT 52 51 60  --   --   --   --   --   --   ALKPHOS 73 61 67  --   --   --   --   --   --   BILITOT 0.5 0.7 0.9  --   --   --   --   --   --   PROT 6.0* 5.1* 5.6*  --   --   --   --   --   --   ALBUMIN 2.6* 2.2* 2.3*   < > 1.8* 1.7* 1.8* 1.8* 1.5*   < > = values in this interval not displayed.   CBC: Recent Labs  Lab 03/02/2017 1827 03/04/17 0102 03/04/17 0331 03/04/17 9604 03/05/17 0523 03/06/17 0931 03/07/17 0446  WBC 2.2* 1.9* 1.7* 2.2* 2.7*  --  7.7  NEUTROABS 1.9  --   --   --   --   --  7.1*  HGB 6.7* 6.9* 6.6* 9.2* 8.4* 8.6* 8.4*  HCT 21.2* 20.9* 20.4* 27.5* 25.6* 25.8* 25.3*  MCV 96.4 94.0 96.7 91.8 91.5  --  90.3  PLT 77* 74* 70* 110* 82*  --  71*   Cardiac Enzymes: Recent Labs  Lab 03/20/2017 1827 03/04/17 0608  TROPONINI 0.03* 0.38*    CBG: Recent Labs  Lab 03/05/17 1447 03/05/17 1637 03/05/17 1955 03/06/17 0110 03/06/17 0413  GLUCAP 87 86 84 98 96    Recent Results (from the past 240 hour(s))  MRSA PCR Screening     Status: None   Collection Time: 02/28/2017   9:15 PM  Result Value Ref Range Status   MRSA by PCR NEGATIVE NEGATIVE Final    Comment:        The GeneXpert MRSA Assay (FDA approved for NASAL specimens only), is one component of a comprehensive MRSA colonization surveillance program. It is not intended to diagnose MRSA infection nor to guide or monitor treatment for MRSA infections.   Culture, blood (Routine X 2) w Reflex to ID Panel     Status: None (Preliminary result)   Collection Time: 03/02/2017  9:39 PM  Result Value Ref Range Status   Specimen Description BLOOD LEFT HAND  Final   Special Requests   Final    BOTTLES DRAWN AEROBIC AND ANAEROBIC Blood Culture adequate volume   Culture NO GROWTH 4 DAYS  Final   Report Status PENDING  Incomplete  Culture, blood (Routine X 2) w Reflex to ID Panel     Status: None (Preliminary result)   Collection Time: 03/10/2017  9:43 PM  Result Value Ref Range Status   Specimen Description BLOOD RIGHT HAND  Final   Special Requests   Final    BOTTLES DRAWN AEROBIC AND ANAEROBIC Blood Culture adequate volume   Culture NO GROWTH 4 DAYS  Final   Report Status PENDING  Incomplete     Studies: No results found.  Scheduled Meds: . chlorhexidine gluconate (  MEDLINE KIT)  15 mL Mouth Rinse BID  . hydrocortisone sod succinate (SOLU-CORTEF) inj  50 mg Intravenous Q6H  . mouth rinse  15 mL Mouth Rinse 10 times per day  . pantoprazole (PROTONIX) IV  40 mg Intravenous Q12H  . sodium chloride flush  3 mL Intravenous Q12H   Continuous Infusions: . cefTRIAXone (ROCEPHIN)  IV Stopped (03/07/17 1008)  . dextrose 125 mL/hr at 03/07/17 0600  . fentaNYL infusion INTRAVENOUS Stopped (03/06/17 0800)  . norepinephrine (LEVOPHED) Adult infusion Stopped (03/07/17 0855)  . phenylephrine (NEO-SYNEPHRINE) Adult infusion Stopped (03/06/17 1016)  . pureflow 3 each (03/07/17 0427)  . vasopressin (PITRESSIN) infusion - *FOR SHOCK* Stopped (03/07/17 1324)    Assessment/Plan:  1. Acute cardiac arrest.   Supportive care.  Calcified mass on mitral valve could be vegetation versus calcified leaflet. 2. Hypovolemic shock versus cardiogenic shock.  Patient off pressors at this point 3. Acute hypoxic respiratory failure.  Continue ventilatory support.  Patient on 28% FiO2. 4. Acute anuric renal failure.  Patient on CRRT 5. Acute blood loss anemia with supratherapeutic INR.  Patient received 2 units of packed red blood cells with good response.  Patient received vitamin K and K Centra.  Last hemoglobin 8.4 6. Metabolic acidosis.  Continue vent management and CRRT 7. Clinical sepsis with pneumonia on empiric antibiotics.  8. History of atrial fibrillation.  Holding all meds with hypotension and GI bleed. 9. History of BPH.  Holding Flomax with hypotension 10. Hyperlipidemia unspecified holding Crestor 11. Hypothyroidism unspecified holding levothyroxine. 12. Hypoglycemia.  Patient on D10 drip. 13. Hypothermia.  Patient on bear hugger. 14. Pancytopenia probably secondary to sepsis 15. Pharmacy doing electrolyte protocol to replace electrolytes 16. Overall prognosis poor, DNR. Palliative to speak with family about goals of care today.  Code Status:     Code Status Orders  (From admission, onward)        Start     Ordered   03/04/17 0955  Do not attempt resuscitation (DNR)  Continuous    Question Answer Comment  In the event of cardiac or respiratory ARREST Do not call a "code blue"   In the event of cardiac or respiratory ARREST Do not perform Intubation, CPR, defibrillation or ACLS   In the event of cardiac or respiratory ARREST Use medication by any route, position, wound care, and other measures to relive pain and suffering. May use oxygen, suction and manual treatment of airway obstruction as needed for comfort.      03/04/17 0954    Code Status History    Date Active Date Inactive Code Status Order ID Comments User Context   03/24/2017 19:38 03/04/2017 09:54 Full Code [id number]  Hillary Bow, MD ED   01/27/2017 20:17 01/28/2017 20:34 DNR [id number]  Max Sane, MD ED   12/12/2016 15:56 12/14/2016 20:28 DNR [id number]  Vaughan Basta, MD Inpatient   09/19/2016 19:02 09/21/2016 19:55 Full Code [id number]  Bettey Costa, MD Inpatient   07/20/2016 16:50 07/21/2016 16:51 Full Code [id number]  Isaias Cowman, MD Inpatient   06/29/2016 19:37 07/01/2016 21:03 Full Code [id number]  Gladstone Lighter, MD Inpatient   09/07/2015 21:31 09/08/2015 19:58 Full Code [id number]  Quintella Baton, MD Inpatient    Advance Directive Documentation     Most Recent Value  Type of Advance Directive  Living will  Pre-existing out of facility DNR order (yellow form or pink MOST form)  No data  "MOST" Form in Place?  No data     Family Communication: As per  critical care specialist.  Spoke with son yesterday Disposition Plan: To be determined based on clinical course.  Consultants:  Critical care specialist  Nephrology  Cardiology  Palliative Care  Antibiotics:  rocephin  Time spent: 20 minutes.  Overall prognosis is poor.   Kyle Vasquez [employer]

## 2017-03-07 NOTE — Consult Note (Signed)
Consultation Note Date: 03/07/2017   Patient Name: Kyle Vasquez  DOB: 04-22-1931  MRN: [id number]  Age / Sex: 83 y.o., male  PCP: Tracie Harrier, MD Referring Physician: Loletha Grayer, MD  Reason for Consultation: Establishing goals of care  HPI/Patient Profile: 83 y.o. male  with past medical history of a. Fib, bradycardia., COPD, depression, HLD, OSA, CKD, diastolic CHF admitted on 59/0/9311 with cardiac arrest and PEA event of 6 minutes requiring 2 rounds of epinephrine and intubation. During this admission he has required ventilator support and sedation for agitation. When he is sedated his blood pressure drops and he requires pressor resuscitation. He is now on CRRT in anuric renal failure. Also with pnuemonia and clinical sepsis. When sedation is lifted he does not wake or follow commands. Palliative medicine consulted for [name].    Clinical Assessment and Goals of Care:  I have reviewed medical records including EPIC notes, labs and imaging, received report from [place], NP; Dr. Alva Garnet, and patient's RN, assessed the patient and then met at the bedside along with patient's son- Kyle Vasquez who is his HCPOA (paperwork is on patient's chart) and granddaughter, Kyle Vasquez  to discuss diagnosis prognosis, GOC, EOL wishes, disposition and options.  I introduced Palliative Medicine as specialized medical care for people living with serious illness. It focuses on providing relief from the symptoms and stress of a serious illness. The goal is to improve quality of life for both the patient and the family.  We discussed a brief life review of the patient. He was a great family man who did whatever jobs he had to do to provide for his family. His independence was always important to him. Kyle Vasquez worked hard taking care of him and visited him daily to shave and bathe him.  As far as functional and nutritional status-  Kyle Vasquez notes a significant decline in the last year. Patient has been immobile over the last year, being too weak to walk. Kyle Vasquez would physically lift patient from his bed to his wheelchair and back to take him to medical appointments. He has been in and out of the hospital.    We discussed their current illness and what it means in the larger context of their on-going co-morbidities.  Natural disease trajectory and expectations at EOL were discussed.  The difference between aggressive medical intervention and comfort care was explained and considered in light of the patient's goals of care. Kyle Vasquez expresses his desire to transition patient to comfort care. He notes that patient has had poor quality of life the past year. Kyle Vasquez says that even though her grandfather would say, "I'm OK"...she has felt frequently he has been suffering. Kyle Vasquez feels that continued aggressive care is prolonging is suffering and does not feel he will return to a quality of life that he would choose.    We discussed that transition to comfort care would include stopping CRRT, extubation, stopping IV fluids and IV medications that are given with intention of prolonging life. Comfort medications would be provided. Discussed extubation process and the  possibility that patient may continue to breathe on his own, patient may survive minutes to days.   Questions and concerns were addressed. The family was encouraged to call with questions or concerns.    Primary Decision Maker HCPOA Kyle Vasquez- patient's son    SUMMARY OF RECOMMENDATIONS -Plan to transition to full comfort care tomorrow around 2pm -Continue current scope care for now    Code Status/Advance Care Planning:  DNR  Prognosis:    Unable to determine  Discharge Planning: To Be Determined  Primary Diagnoses: Present on Admission: . Cardiac arrest ([place])   I have reviewed the medical record, interviewed the patient and family, and examined the patient. The  following aspects are pertinent.  Past Medical History:  Diagnosis Date  . Anemia   . Atrial fibrillation ([place])   . Bradycardia   . Chronic kidney disease   . COPD (chronic obstructive pulmonary disease) ([place])   . Coronary artery disease   . Depression   . Heart murmur   . HLD (hyperlipidemia)   . Hypertension   . Hypothyroidism   . Lumbar myelopathy ([place])   . MDS (myelodysplastic syndrome) ([place])   . OSA (obstructive sleep apnea)   . Stroke [facility])    left sided weakness  . Syncope    Social History   Socioeconomic History  . Marital status: Married    Spouse name: None  . Number of children: None  . Years of education: None  . Highest education level: None  Social Needs  . Financial resource strain: None  . Food insecurity - worry: None  . Food insecurity - inability: None  . Transportation needs - medical: None  . Transportation needs - non-medical: None  Occupational History  . None  Tobacco Use  . Smoking status: Former Research scientist (life sciences)  . Smokeless tobacco: Never Used  Substance and Sexual Activity  . Alcohol use: No  . Drug use: No  . Sexual activity: None  Other Topics Concern  . None  Social History Narrative   Lives at home with wife, bedbound at baseline   Family History  Problem Relation Age of Onset  . Hypertension Mother   . Congestive Heart Failure Father   . Hypertension Father    Scheduled Meds: . chlorhexidine gluconate (MEDLINE KIT)  15 mL Mouth Rinse BID  . hydrocortisone sod succinate (SOLU-CORTEF) inj  50 mg Intravenous Q6H  . mouth rinse  15 mL Mouth Rinse 10 times per day  . pantoprazole (PROTONIX) IV  40 mg Intravenous Q12H  . sodium chloride flush  3 mL Intravenous Q12H   Continuous Infusions: . cefTRIAXone (ROCEPHIN)  IV Stopped (03/07/17 1008)  . dextrose 125 mL/hr at 03/07/17 0600  . fentaNYL infusion INTRAVENOUS Stopped (03/06/17 0800)  . norepinephrine (LEVOPHED) Adult infusion 5 mcg/min (03/07/17 1604)  . phenylephrine  (NEO-SYNEPHRINE) Adult infusion Stopped (03/06/17 1016)  . pureflow 3 each (03/07/17 0427)  . vasopressin (PITRESSIN) infusion - *FOR SHOCK* Stopped (03/07/17 0208)   PRN Meds:.acetaminophen **OR** acetaminophen, albuterol, bisacodyl, fentaNYL, heparin, midazolam, ondansetron **OR** ondansetron (ZOFRAN) IV, polyethylene glycol, sennosides Medications Prior to Admission:  Prior to Admission medications   Medication Sig Start Date End Date Taking? Authorizing Provider  albuterol (PROVENTIL HFA;VENTOLIN HFA) 108 (90 Base) MCG/ACT inhaler Inhale 2 puffs into the lungs every 6 (six) hours as needed for wheezing or shortness of breath.   Yes [provider]  amLODipine (NORVASC) 2.5 MG tablet Take 1 tablet by mouth daily. 02/27/17  Yes [provider]  aspirin EC 81 MG tablet Take 81 mg by mouth daily.   Yes [provider]  cyanocobalamin 500 MCG tablet Take 500 mcg by mouth 2 (two) times daily.   Yes [provider]  Fluticasone-Salmeterol (ADVAIR) 250-50 MCG/DOSE AEPB Inhale 1 puff into the lungs 2 (two) times daily.   Yes [provider]  Multiple Vitamins-Minerals (MULTIVITAMIN WITH MINERALS) tablet Take 1 tablet by mouth daily.   Yes [provider]  tamsulosin (FLOMAX) 0.4 MG CAPS capsule Take 1 capsule (0.4 mg total) by mouth daily. 01/28/17  Yes Vaughan Basta, MD  warfarin (COUMADIN) 3 MG tablet Take 3 mg by mouth daily. Take as directed by anticoagulation clinic   Yes [provider]  acetaminophen (TYLENOL) 325 MG tablet Take 1 tablet (325 mg total) by mouth every 6 (six) hours as needed for mild pain. 12/14/16   Gouru, Illene [place], MD  amLODipine (NORVASC) 5 MG tablet Take 1 tablet (5 mg total) by mouth daily. Patient not taking: Reported on 03/07/2017 07/01/16   Fritzi Mandes, MD  azithromycin [facility]) 250 MG tablet '250mg'$  once daily for 4 more days Patient not taking: Reported on 03/07/2017 02/01/17   Lisa Roca, MD  docusate  sodium (COLACE) 100 MG capsule Take 1 capsule (100 mg total) by mouth 2 (two) times daily as needed for mild constipation. 12/14/16   Nicholes Mango, MD  HYDROcodone-acetaminophen (NORCO/VICODIN) 5-325 MG tablet Take 1-2 tablets by mouth every 4 (four) hours as needed for moderate pain. Patient not taking: Reported on 01/27/2017 09/21/16   Epifanio Lesches, MD  levothyroxine (SYNTHROID, LEVOTHROID) 25 MCG tablet Take 1 tablet (25 mcg total) by mouth daily before breakfast. 07/22/16   Clabe Seal, PA-C  rosuvastatin (CRESTOR) 5 MG tablet Take 1 tablet (5 mg total) by mouth daily at 6 PM. 07/21/16   Clabe Seal, PA-C   No Known Allergies Review of Systems  Physical Exam  Pulmonary/Chest:  Intubated  Skin: Skin is warm and dry.  Diffuse anasarca  Nursing note and vitals reviewed.   Vital Signs: BP (!) 79/53   Pulse 81   Temp (!) 96.8 F (36 C)   Resp (!) 24   Ht '5\' 6"'$  (1.676 m)   Wt 106.6 kg (235 lb 0.2 oz)   SpO2 96%   BMI 37.93 kg/m  Pain Assessment: CPOT       SpO2: SpO2: 96 % O2 Device:SpO2: 96 % O2 Flow Rate: .   IO: Intake/output summary:   Intake/Output Summary (Last 24 hours) at 03/07/2017 1630 Last data filed at 03/07/2017 1400 Gross per 24 hour  Intake 2315.57 ml  Output 1160 ml  Net 1155.57 ml    LBM: Last BM Date: 03/06/17 Baseline Weight: Weight: 72.1 kg (159 lb) Most recent weight: Weight: 106.6 kg (235 lb 0.2 oz)     Palliative Assessment/Data: PPS: 10%     Thank you for this consult. Palliative medicine will continue to follow and assist as needed.   Time In: 1430 Time Out: 1600 Time Total: 90 minutes Prolonged services billed: Yes Greater than 50%  of this time was spent counseling and coordinating care related to the above assessment and plan.  Signed by: Mariana Kaufman, AGNP-C Palliative Medicine    Please contact Palliative Medicine Team phone at [phone] for questions and concerns.  For individual provider: See  Shea Evans

## 2017-03-07 NOTE — Progress Notes (Signed)
[facility], [place] 03/07/17  Subjective:  Patient remains on CRRT at this time. As before he remains critically ill. Pulmonary/critical care to have family meeting.   Objective:  Vital signs in last 24 hours:  Temp:  [96.6 F (35.9 C)-97.8 F (36.6 C)] 97.2 F (36.2 C) (12/11 1000) Pulse Rate:  [75-81] 80 (12/11 1000) Resp:  [10-25] 22 (12/11 1000) BP: (55-142)/(43-89) 109/68 (12/11 1000) SpO2:  [95 %-99 %] 95 % (12/11 1000) Arterial Line BP: (59-161)/(35-80) 113/56 (12/11 0945) FiO2 (%):  [28 %-35 %] 28 % (12/11 0811) Weight:  [106.6 kg (235 lb 0.2 oz)] 106.6 kg (235 lb 0.2 oz) (12/11 0500)  Weight change: 6.3 kg (13 lb 14.2 oz) Filed Weights   03/06/2017 2232 03/06/17 0500 03/07/17 0500  Weight: 71.2 kg (156 lb 15.5 oz) 100.3 kg (221 lb 1.9 oz) 106.6 kg (235 lb 0.2 oz)    Intake/Output:    Intake/Output Summary (Last 24 hours) at 03/07/2017 1050 Last data filed at 03/07/2017 0800 Gross per 24 hour  Intake 4303.92 ml  Output 1169 ml  Net 3134.92 ml     Physical Exam: General:  Critically ill appearing  HEENT  ET tube, OG tube in place  Neck  supple  Pulm/lungs  scattered rhonchi, vent assisted  CVS/Heart  regular rhythm, no rubs  Abdomen:   soft, mild distension, scant BS  Extremities:  trace b/l LE edema  Neurologic:  Sedated  Skin:  No acute rashes  Dialysis Access:  IJ temporary dialysis access       Basic Metabolic Panel:  Recent Labs  Lab 03/15/2017 2320  03/04/17 0608  03/05/17 0523  03/06/17 0351 03/06/17 0931 03/06/17 1505 03/06/17 2116 03/07/17 0307 03/07/17 0446 03/07/17 0806  NA 138   < > 140   < >  --    < > 128* 129* 129* 129* 129*  --  127*  K 5.9*   < > 5.6*   < >  --    < > 3.8 3.9 4.1 4.0 3.8  --  3.7  CL 117*   < > 118*   < >  --    < > 100* 100* 100* 99* 100*  --  99*  CO2 15*   < > 17*   < >  --    < > '22 23 22 '$ 21* 22  --  23  GLUCOSE 95   < > 41*   < >  --    < > 106* 101* 122* 122* 107*  --  181*   BUN 72*   < > 65*   < >  --    < > '20 19 17 16 15  '$ --  13  CREATININE 3.76*   < > 3.69*   < >  --    < > 1.44* 1.41* 1.34* 1.31* 1.21  --  1.33*  CALCIUM 8.1*   < > 7.5*   < >  --    < > 7.1* 7.0* 7.1* 7.3* 7.5*  --  7.2*  MG 2.4  --  2.1  --  1.5*  --  1.6*  --   --   --   --  1.8  --   PHOS 4.2  --  4.5   < >  --    < > 2.1* 2.1* 2.5 2.2* 2.3*  --  2.3*   < > = values in this interval not displayed.     CBC: Recent  Labs  Lab 03/06/2017 1827 03/04/17 0102 03/04/17 0331 03/04/17 7782 03/05/17 0523 03/06/17 0931 03/07/17 0446  WBC 2.2* 1.9* 1.7* 2.2* 2.7*  --  7.7  NEUTROABS 1.9  --   --   --   --   --  7.1*  HGB 6.7* 6.9* 6.6* 9.2* 8.4* 8.6* 8.4*  HCT 21.2* 20.9* 20.4* 27.5* 25.6* 25.8* 25.3*  MCV 96.4 94.0 96.7 91.8 91.5  --  90.3  PLT 77* 74* 70* 110* 82*  --  71*     No results found for: HEPBSAG, HEPBSAB, HEPBIGM    Microbiology:  Recent Results (from the past 240 hour(s))  MRSA PCR Screening     Status: None   Collection Time: 03/12/2017  9:15 PM  Result Value Ref Range Status   MRSA by PCR NEGATIVE NEGATIVE Final    Comment:        The GeneXpert MRSA Assay (FDA approved for NASAL specimens only), is one component of a comprehensive MRSA colonization surveillance program. It is not intended to diagnose MRSA infection nor to guide or monitor treatment for MRSA infections.   Culture, blood (Routine X 2) w Reflex to ID Panel     Status: None (Preliminary result)   Collection Time: 03/02/2017  9:39 PM  Result Value Ref Range Status   Specimen Description BLOOD LEFT HAND  Final   Special Requests   Final    BOTTLES DRAWN AEROBIC AND ANAEROBIC Blood Culture adequate volume   Culture NO GROWTH 4 DAYS  Final   Report Status PENDING  Incomplete  Culture, blood (Routine X 2) w Reflex to ID Panel     Status: None (Preliminary result)   Collection Time: 03/16/2017  9:43 PM  Result Value Ref Range Status   Specimen Description BLOOD RIGHT HAND  Final   Special Requests    Final    BOTTLES DRAWN AEROBIC AND ANAEROBIC Blood Culture adequate volume   Culture NO GROWTH 4 DAYS  Final   Report Status PENDING  Incomplete    Coagulation Studies: Recent Labs    03/04/17 1649 03/05/17 0523 03/05/17 0843 03/06/17 0351  LABPROT 20.2* 19.4* 19.4* 21.4*  INR 1.74 1.65 1.65 1.87    Urinalysis: No results for input(s): COLORURINE, LABSPEC, PHURINE, GLUCOSEU, HGBUR, BILIRUBINUR, KETONESUR, PROTEINUR, UROBILINOGEN, NITRITE, LEUKOCYTESUR in the last 72 hours.  Invalid input(s): APPERANCEUR    Imaging: No results found.   Medications:   . cefTRIAXone (ROCEPHIN)  IV Stopped (03/07/17 1008)  . dextrose 125 mL/hr at 03/07/17 0600  . fentaNYL infusion INTRAVENOUS Stopped (03/06/17 0800)  . norepinephrine (LEVOPHED) Adult infusion Stopped (03/07/17 0855)  . phenylephrine (NEO-SYNEPHRINE) Adult infusion Stopped (03/06/17 1016)  . pureflow 3 each (03/07/17 0427)  . vasopressin (PITRESSIN) infusion - *FOR SHOCK* Stopped (03/07/17 4235)   . chlorhexidine gluconate (MEDLINE KIT)  15 mL Mouth Rinse BID  . hydrocortisone sod succinate (SOLU-CORTEF) inj  50 mg Intravenous Q6H  . mouth rinse  15 mL Mouth Rinse 10 times per day  . pantoprazole (PROTONIX) IV  40 mg Intravenous Q12H  . sodium chloride flush  3 mL Intravenous Q12H   acetaminophen **OR** acetaminophen, albuterol, bisacodyl, fentaNYL, heparin, midazolam, ondansetron **OR** ondansetron (ZOFRAN) IV, polyethylene glycol, sennosides  Assessment/ Plan:  83 y.o. African American male with Atrial fibrillation, sick sinus syndrome, Pacemaker placement 06/2016, hypertension, hypothyroidism, myelodysplastic syndrome, obstructive sleep apnea, stroke with left-sided weakness, history of syncope, chronic kidney disease stage IV  1.  ARF on CKD st 4 2.  Acute  Resp Failure. ARDS 3.  Hyperkalemia 4.  Lactic Acidosis 5.  Coagulopathy  Plan: Overall the patient remains critically ill.  No significant improvement noted  thus far.  He remains dependent upon renal replacement therapy.  At this moment in time we will continue the patient on CRRT.  However pulmonary/critical care having a discussion with the patient's family regarding goals of care later today.  As before he appears to have a very guarded prognosis.  Previously the patient's family did not want sustained dialysis treatment.  They were okay with temporary renal placement therapy.   LOS: 4 Curlee Bogan 12/11/201810:50 AM  [place], [place]

## 2017-03-08 DIAGNOSIS — R0602 Shortness of breath: Secondary | ICD-10-CM

## 2017-03-08 DIAGNOSIS — Z7189 Other specified counseling: Secondary | ICD-10-CM

## 2017-03-08 DIAGNOSIS — Z515 Encounter for palliative care: Secondary | ICD-10-CM

## 2017-03-08 DIAGNOSIS — N179 Acute kidney failure, unspecified: Secondary | ICD-10-CM

## 2017-03-08 DIAGNOSIS — R570 Cardiogenic shock: Secondary | ICD-10-CM

## 2017-03-08 LAB — BASIC METABOLIC PANEL
ANION GAP: 4 — AB (ref 5–15)
BUN: 12 mg/dL (ref 6–20)
CALCIUM: 7.6 mg/dL — AB (ref 8.9–10.3)
CO2: 25 mmol/L (ref 22–32)
Chloride: 101 mmol/L (ref 101–111)
Creatinine, Ser: 1.07 mg/dL (ref 0.61–1.24)
GFR calc Af Amer: >60 mL/min (ref >60)
GLUCOSE: 103 mg/dL — AB (ref 65–99)
POTASSIUM: 3.9 mmol/L (ref 3.5–5.1)
SODIUM: 130 mmol/L — AB (ref 135–145)

## 2017-03-08 LAB — RENAL FUNCTION PANEL
ALBUMIN: 1.8 g/dL — AB (ref 3.5–5.0)
ANION GAP: 5 (ref 5–15)
ANION GAP: 4 — AB (ref 5–15)
Albumin: 1.7 g/dL — ABNORMAL LOW (ref 3.5–5.0)
BUN: 13 mg/dL (ref 6–20)
BUN: 12 mg/dL (ref 6–20)
CALCIUM: 7.7 mg/dL — AB (ref 8.9–10.3)
CHLORIDE: 99 mmol/L — AB (ref 101–111)
CO2: 26 mmol/L (ref 22–32)
CO2: 26 mmol/L (ref 22–32)
Calcium: 7.5 mg/dL — ABNORMAL LOW (ref 8.9–10.3)
Chloride: 99 mmol/L — ABNORMAL LOW (ref 101–111)
Creatinine, Ser: 1.16 mg/dL (ref 0.61–1.24)
Creatinine, Ser: 1.17 mg/dL (ref 0.61–1.24)
GFR calc Af Amer: >60 mL/min (ref >60)
GFR calc Af Amer: >60 mL/min (ref >60)
GFR calc non Af Amer: 56 mL/min — ABNORMAL LOW (ref >60)
GFR, EST NON AFRICAN AMERICAN: 56 mL/min — AB (ref >60)
GLUCOSE: 105 mg/dL — AB (ref 65–99)
Glucose, Bld: 101 mg/dL — ABNORMAL HIGH (ref 65–99)
PHOSPHORUS: 2.2 mg/dL — AB (ref 2.5–4.6)
POTASSIUM: 3.9 mmol/L (ref 3.5–5.1)
POTASSIUM: 3.9 mmol/L (ref 3.5–5.1)
Phosphorus: 2.4 mg/dL — ABNORMAL LOW (ref 2.5–4.6)
SODIUM: 129 mmol/L — AB (ref 135–145)
Sodium: 130 mmol/L — ABNORMAL LOW (ref 135–145)

## 2017-03-08 LAB — MAGNESIUM: Magnesium: 1.5 mg/dL — ABNORMAL LOW (ref 1.7–2.4)

## 2017-03-08 LAB — APTT: aPTT: 66 seconds — ABNORMAL HIGH (ref 24–36)

## 2017-03-08 LAB — CULTURE, BLOOD (ROUTINE X 2)
CULTURE: NO GROWTH
CULTURE: NO GROWTH
SPECIAL REQUESTS: ADEQUATE
Special Requests: ADEQUATE

## 2017-03-08 MED ORDER — MORPHINE BOLUS VIA INFUSION
2.0000 mg | INTRAVENOUS | Status: DC | PRN
  Administered 2017-03-08: 4 mg via INTRAVENOUS
  Administered 2017-03-08: 2 mg via INTRAVENOUS
  Filled 2017-03-08: qty 4

## 2017-03-08 MED ORDER — SODIUM CHLORIDE 0.9 % IV SOLN
5.0000 mg/h | INTRAVENOUS | Status: DC
  Administered 2017-03-08: 5 mg/h via INTRAVENOUS
  Filled 2017-03-08: qty 10

## 2017-03-08 MED ORDER — GLYCOPYRROLATE 0.2 MG/ML IJ SOLN
0.2000 mg | INTRAMUSCULAR | Status: DC | PRN
  Administered 2017-03-08: 0.2 mg via INTRAVENOUS
  Filled 2017-03-08 (×2): qty 1

## 2017-03-08 MED ORDER — LORAZEPAM 2 MG/ML IJ SOLN
1.0000 mg | INTRAMUSCULAR | Status: DC | PRN
  Administered 2017-03-08: 1 mg via INTRAVENOUS
  Filled 2017-03-08: qty 1

## 2017-03-08 MED ORDER — POTASSIUM PHOSPHATES 15 MMOLE/5ML IV SOLN
30.0000 mmol | Freq: Once | INTRAVENOUS | Status: DC
  Filled 2017-03-08: qty 10

## 2017-03-08 MED ORDER — MAGNESIUM SULFATE 2 GM/50ML IV SOLN: 2.0000 g | Freq: Once | INTRAVENOUS | Status: DC

## 2017-03-08 NOTE — Progress Notes (Signed)
Kyle Vasquez responded to an OR for End of Life. Kyle Vasquez spent time with the family in the waiting area, listening to stories of the Pts life. Kyle Vasquez escorted family to IC-04 and stayed with them as they said their goodbyes. Kyle Vasquez allowed family space and privacy.    03/08/17 1500  Clinical Encounter Type  Visited With Patient;Patient and family together;Health care provider  Visit Type Initial;Spiritual support;Critical Care;Patient actively dying  Referral From Nurse  Consult/Referral To Chaplain  Spiritual Encounters  Spiritual Needs Prayer;Emotional;Grief support

## 2017-03-08 NOTE — Consult Note (Signed)
PULMONARY / CRITICAL CARE MEDICINE   Name: Kyle Vasquez MRN: [id number] DOB: 1931/11/13    ADMISSION DATE:  03/20/2017   CONSULTATION DATE:  03/14/2017  REFERRING MD:  Dr. Darvin Neighbours  REASON: Cardiac arrest  HISTORY OF PRESENT ILLNESS:   Patient with multiorgan failure On full ventilatory support On CRRT Patient is DNR Patient is intubated and sedated Patient remains critically ill with severe respiratory failure  Review of systems unobtainable due to critical illness  VITAL SIGNS: BP 112/82   Pulse 86   Temp (!) 97.5 F (36.4 C)   Resp (!) 21   Ht '5\' 6"'$  (1.676 m)   Wt 236 lb 8.9 oz (107.3 kg)   SpO2 97%   BMI 38.18 kg/m   HEMODYNAMICS:    VENTILATOR SETTINGS: Vent Mode: PRVC FiO2 (%):  [28 %] 28 % Set Rate:  [24 bmp] 24 bmp Vt Set:  [600 mL] 600 mL PEEP:  [5 cmH20] 5 cmH20  INTAKE / OUTPUT: I/O last 3 completed shifts: In: 3135 [I.V.:3135] Out: 5638 [Urine:25; Emesis/NG output:120; Other:1587]  PHYSICAL EXAMINATION: General:  Sedated, NAD Neuro: Biting ETT, withdraws to pain HEENT: PERRLA, edentulous, neck is supple without JVD Cardiovascular: Irregular, S1/S2, no MRG, +2 pulses, no edema Lungs: Bilateral breath sounds, no wheezing Abdomen: non-distended, normal BS X4 Musculoskeletal:  +ROM, no deformities Skin:  Cold and clammy, pale  LABS:  BMET Recent Labs  Lab 03/07/17 2108 03/08/17 0320 03/08/17 0440  NA 128* 130* 130*  K 3.9 3.9 3.9  CL 98* 99* 101  CO2 '23 26 25  '$ BUN '13 13 12  '$ CREATININE 1.06 1.16 1.07  GLUCOSE 109* 105* 103*    Electrolytes Recent Labs  Lab 03/06/17 0351  03/07/17 0446  03/07/17 1339 03/07/17 2108 03/08/17 0320 03/08/17 0440  CALCIUM 7.1*   < >  --    < > 7.4* 7.4* 7.5* 7.6*  MG 1.6*  --  1.8  --   --   --   --  1.5*  PHOS 2.1*   < >  --    < > 1.9* 2.5 2.4*  --    < > = values in this interval not displayed.    CBC Recent Labs  Lab 03/04/17 0608 03/05/17 0523 03/06/17 0931 03/07/17 0446  WBC 2.2*  2.7*  --  7.7  HGB 9.2* 8.4* 8.6* 8.4*  HCT 27.5* 25.6* 25.8* 25.3*  PLT 110* 82*  --  71*    Coag's Recent Labs  Lab 03/05/17 0523 03/05/17 0843 03/06/17 0351 03/07/17 0446 03/08/17 0440  APTT 53*  --  59* 68* 66*  INR 1.65 1.65 1.87  --   --     Sepsis Markers Recent Labs  Lab 03/13/2017 2138 03/04/17 0331 03/04/17 0608 03/05/17 0523  LATICACIDVEN  --  2.9* 1.9  --   PROCALCITON 0.46 0.45  --  8.53    ABG Recent Labs  Lab 03/04/17 0604 03/04/17 1104 03/05/17 0809  PHART 7.20* 7.20* 7.23*  PCO2ART 43 39 60*  PO2ART 218* 74* 47*    Liver Enzymes Recent Labs  Lab 03/24/2017 1827 03/04/17 0331 03/04/17 0608  03/07/17 1339 03/07/17 2108 03/08/17 0320  AST 82* 91* 110*  --   --   --   --   ALT 52 51 60  --   --   --   --   ALKPHOS 73 61 67  --   --   --   --   BILITOT 0.5 0.7 0.9  --   --   --   --  ALBUMIN 2.6* 2.2* 2.3*   < > 1.7* 1.7* 1.7*   < > = values in this interval not displayed.    Cardiac Enzymes Recent Labs  Lab 03/21/2017 1827 03/04/17 0608  TROPONINI 0.03* 0.38*    Glucose Recent Labs  Lab 03/05/17 1324 03/05/17 1447 03/05/17 1637 03/05/17 1955 03/06/17 0110 03/06/17 0413  GLUCAP 85 87 86 84 98 96    Imaging No results found. STUDIES:  2-D echo pending  CULTURES: Blood cultures x 2 Urine culture Respiratory   ANTIBIOTICS: Ceftriaxone Azithromycin SIGNIFICANT EVENTS: 12/07>a=cardiac arrest>admitted  LINES/TUBES: 12/07 PIVs ETT Foley  DISCUSSION: 83 Y/O male presenting with cardiac arrest, community acquired pneumonia, cardiogenic/hypovolemic shock, acute blood loss anemia, coumadin induced coagulopathy, acute on chronic renal failure, thrombocytopenia, neutropenia and hyperkalemia  ASSESSMENT / PLAN:  PULMONARY A: Severe  acute respiratory failure secondary to cardiac arrest Acute pulmonary edema Community acquired pneumonia Severe metabolic acidosis P:   Full vent support with current settings VAP  protocol Nebulized bronchodilators ABG and CXR as needed Abx for CAP as above Bicarb infusion and vent changes  CARDIOVASCULAR A:  Cardiac arrest Cardiogenic/hypovolemic shock H/O Afib P:  Hemodynamic monitoring per ICU protocol CVP Q4H IV fluids HR control with prn metoprolol Pressors to maintain mean arterial blood pressure less than 65 Transfuse per protocol  RENAL A:   Acute on chronic renal failure Hyperkalemia P:   IV fluids Monitor and correct electrolytes Nephrology consult Patient may require CRRT  GASTROINTESTINAL A:   Acute GI bleed P:   GI consulted; we will probably scope patient today Protonix infusion Blood products as needed  HEMATOLOGIC A:   Acute blood loss anemia Coumadin induced coagulaopathy P:  Kcentra DC Coumadin Trend PT/INR Transfuse blood products as needed  INFECTIOUS A:   CAP Sepsis-urinary versus pulmonary source P:   Antibiotics as above Follow-up cultures  ENDOCRINE A:   H/o Hypothyroidism   P:   Resume home medications Monitor blood glucose and treat hypoglycemia  NEUROLOGIC A:   Acute metabolic encephalopathy 2/2 sepsis and cardiac arrest P:   RASS goal: 0 to -1 Monitor neurological status Fentanyl and as needed Versed for vent sedation and discomfort  Patient currently is DNR Patient with multiorgan failure Prognosis is very poor   Critical Care Time devoted to patient care services described in this note is 34 minutes.   Overall, patient is critically ill, prognosis is guarded.  Patient with Multiorgan failure and at high risk for cardiac arrest and death.   Patient remains on  ventilatory support unable to wean from ventilator today due to resp muscle weakness and encephalopathy Palliative care team met with family, plan for comfort care measures at some point today   Corrin Parker, M.D.  [facility]  Medical Director [place] Director [facility]  Cardio-Pulmonary Department

## 2017-03-08 NOTE — Progress Notes (Signed)
[facility], [place] 03/08/17  Subjective:  Critical illness persist. Patient continues on CRRT. Another family meeting to occur today.  Objective:  Vital signs in last 24 hours:  Temp:  [96.8 F (36 C)-99.5 F (37.5 C)] 97.5 F (36.4 C) (12/12 0700) Pulse Rate:  [80-86] 86 (12/12 0700) Resp:  [18-27] 21 (12/12 0700) BP: (74-137)/(51-88) 112/82 (12/12 0700) SpO2:  [91 %-100 %] 97 % (12/12 0758) Arterial Line BP: (84-163)/(47-84) 132/69 (12/12 0600) FiO2 (%):  [28 %] 28 % (12/12 0758) Weight:  [107.3 kg (236 lb 8.9 oz)] 107.3 kg (236 lb 8.9 oz) (12/12 0500)  Weight change: 0.7 kg (1 lb 8.7 oz) Filed Weights   03/06/17 0500 03/07/17 0500 03/08/17 0500  Weight: 100.3 kg (221 lb 1.9 oz) 106.6 kg (235 lb 0.2 oz) 107.3 kg (236 lb 8.9 oz)    Intake/Output:    Intake/Output Summary (Last 24 hours) at 03/08/2017 0953 Last data filed at 03/08/2017 0700 Gross per 24 hour  Intake 1500 ml  Output 975 ml  Net 525 ml     Physical Exam: General:  Critically ill appearing  HEENT  ET tube, OG tube in place  Neck  supple  Pulm/lungs  scattered rhonchi, vent assisted  CVS/Heart  regular rhythm, no rubs  Abdomen:   soft, mild distension  Extremities:  1+ b/l LE edema  Neurologic:  Sedated  Skin:  No acute rashes  Dialysis Access:  IJ temporary dialysis access       Basic Metabolic Panel:  Recent Labs  Lab 03/04/17 0608  03/05/17 0523  03/06/17 0351  03/07/17 0446 03/07/17 0806 03/07/17 1339 03/07/17 2108 03/08/17 0320 03/08/17 0440 03/08/17 0822  NA 140   < >  --    < > 128*   < >  --  127* 131* 128* 130* 130* 129*  K 5.6*   < >  --    < > 3.8   < >  --  3.7 3.8 3.9 3.9 3.9 3.9  CL 118*   < >  --    < > 100*   < >  --  99* 99* 98* 99* 101 99*  CO2 17*   < >  --    < > 22   < >  --  _0 GLUCOSE 41*   < >  --    < > 106*   < >  --  181* 131* 109* 105* 103* 101*  BUN 65*   < >  --    < > 20   < >  --  _1 CREATININE 3.69*   < >  --    < > 1.44*   < >  --  1.33* 1.17 1.06 1.16 1.07 1.17  CALCIUM 7.5*   < >  --    < > 7.1*   < >  --  7.2* 7.4* 7.4* 7.5* 7.6* 7.7*  MG 2.1  --  1.5*  --  1.6*  --  1.8  --   --   --   --  1.5*  --   PHOS 4.5   < >  --    < > 2.1*   < >  --  2.3* 1.9* 2.5 2.4*  --  2.2*   < > = values in this interval not displayed.     CBC: Recent Labs  Lab 03/22/2017 1827 03/04/17 0102  03/04/17 0331 03/04/17 2423 03/05/17 0523 03/06/17 0931 03/07/17 0446  WBC 2.2* 1.9* 1.7* 2.2* 2.7*  --  7.7  NEUTROABS 1.9  --   --   --   --   --  7.1*  HGB 6.7* 6.9* 6.6* 9.2* 8.4* 8.6* 8.4*  HCT 21.2* 20.9* 20.4* 27.5* 25.6* 25.8* 25.3*  MCV 96.4 94.0 96.7 91.8 91.5  --  90.3  PLT 77* 74* 70* 110* 82*  --  71*     No results found for: HEPBSAG, HEPBSAB, HEPBIGM    Microbiology:  Recent Results (from the past 240 hour(s))  MRSA PCR Screening     Status: None   Collection Time: 03/15/2017  9:15 PM  Result Value Ref Range Status   MRSA by PCR NEGATIVE NEGATIVE Final    Comment:        The GeneXpert MRSA Assay (FDA approved for NASAL specimens only), is one component of a comprehensive MRSA colonization surveillance program. It is not intended to diagnose MRSA infection nor to guide or monitor treatment for MRSA infections.   Culture, blood (Routine X 2) w Reflex to ID Panel     Status: None   Collection Time: 03/01/2017  9:39 PM  Result Value Ref Range Status   Specimen Description BLOOD LEFT HAND  Final   Special Requests   Final    BOTTLES DRAWN AEROBIC AND ANAEROBIC Blood Culture adequate volume   Culture NO GROWTH 5 DAYS  Final   Report Status 03/08/2017 FINAL  Final  Culture, blood (Routine X 2) w Reflex to ID Panel     Status: None   Collection Time: 03/08/2017  9:43 PM  Result Value Ref Range Status   Specimen Description BLOOD RIGHT HAND  Final   Special Requests   Final    BOTTLES DRAWN AEROBIC AND ANAEROBIC Blood Culture adequate volume   Culture NO GROWTH  5 DAYS  Final   Report Status 03/08/2017 FINAL  Final    Coagulation Studies: Recent Labs    03/06/17 0351  LABPROT 21.4*  INR 1.87    Urinalysis: No results for input(s): COLORURINE, LABSPEC, PHURINE, GLUCOSEU, HGBUR, BILIRUBINUR, KETONESUR, PROTEINUR, UROBILINOGEN, NITRITE, LEUKOCYTESUR in the last 72 hours.  Invalid input(s): APPERANCEUR    Imaging: No results found.   Medications:   . cefTRIAXone (ROCEPHIN)  IV Stopped (03/07/17 1008)  . dextrose 125 mL/hr at 03/08/17 0608  . fentaNYL infusion INTRAVENOUS Stopped (03/06/17 0800)  . norepinephrine (LEVOPHED) Adult infusion Stopped (03/07/17 1645)  . phenylephrine (NEO-SYNEPHRINE) Adult infusion Stopped (03/06/17 1016)  . pureflow 3 each (03/08/17 0258)  . vasopressin (PITRESSIN) infusion - *FOR SHOCK* Stopped (03/07/17 5361)   . chlorhexidine gluconate (MEDLINE KIT)  15 mL Mouth Rinse BID  . hydrocortisone sod succinate (SOLU-CORTEF) inj  50 mg Intravenous Q6H  . mouth rinse  15 mL Mouth Rinse 10 times per day  . pantoprazole (PROTONIX) IV  40 mg Intravenous Q12H  . sodium chloride flush  3 mL Intravenous Q12H   acetaminophen **OR** acetaminophen, albuterol, bisacodyl, fentaNYL, heparin, midazolam, ondansetron **OR** ondansetron (ZOFRAN) IV, polyethylene glycol, sennosides  Assessment/ Plan:  83 y.o. African American male with Atrial fibrillation, sick sinus syndrome, Pacemaker placement 06/2016, hypertension, hypothyroidism, myelodysplastic syndrome, obstructive sleep apnea, stroke with left-sided weakness, history of syncope, chronic kidney disease stage IV  1.  ARF on CKD st 4 2.  Acute Resp Failure. ARDS 3.  Hyperkalemia 4.  Lactic Acidosis 5.  Coagulopathy  Plan:   Patient continues to have  critical illness.  He has done well with continuous renal placement therapy.  Given his overall critical illness and lack of significant improvement family is considering instituting comfort care.  Patient's family to meet  with representatives of the critical care team today.  For now we will maintain the patient on CRRT until further decisions are made.   LOS: 5 ,  12/12/20189:53 AM  [place], [place]

## 2017-03-08 NOTE — Clinical Social Work Note (Signed)
CSW notified that patient is now comfort care via consult. Please re-consult should need for CSW arise. Shela Leff MSW,LCSW [phone]

## 2017-03-08 NOTE — Progress Notes (Signed)
Patient ID: Joshual Terrio, male   DOB: 05/21/1931, 83 y.o.   MRN: [id number]   [employer] PROGRESS NOTE  Haroun Cotham NOB:[id number] DOB: 1931-06-05 DOA: 03/06/2017 PCP: Tracie Harrier, MD  HPI/Subjective: Patient intubated. Has not regained mental status off sedation.  Objective: Vitals:   03/08/17 1200 03/08/17 1225  BP: (!) 74/53 (!) 156/85  Pulse: 77 84  Resp: (!) 24 (!) 24  Temp: 99 F (37.2 C) 99 F (37.2 C)  SpO2: 96% 97%    Filed Weights   03/06/17 0500 03/07/17 0500 03/08/17 0500  Weight: 100.3 kg (221 lb 1.9 oz) 106.6 kg (235 lb 0.2 oz) 107.3 kg (236 lb 8.9 oz)    ROS: Review of Systems  Unable to perform ROS: Acuity of condition   Exam: Physical Exam  Constitutional: He is intubated.  HENT:  Nose: No mucosal edema.  Unable to look into mouth  Eyes: Conjunctivae and lids are normal.  Pupils pinpoint.  Neck: Carotid bruit is not present. No thyromegaly present.  Cardiovascular: S1 normal, S2 normal and normal heart sounds.  Pulses:      Dorsalis pedis pulses are 1+ on the right side, and 1+ on the left side.  Respiratory: He is intubated. He has decreased breath sounds in the right lower field and the left lower field. He has rhonchi in the right middle field, the right lower field and the left lower field.  GI: Soft. Bowel sounds are decreased. There is no tenderness.  Genitourinary:  Genitourinary Comments: Positive for scrotal swelling  Musculoskeletal:       Right ankle: He exhibits swelling.       Left ankle: He exhibits swelling.  Neurological: He is unresponsive.  Skin: Skin is dry. No rash noted. Nails show no clubbing.  Psychiatric:  Patient not able to follow commands      Data Reviewed: Basic Metabolic Panel: Recent Labs  Lab 03/04/17 0608  03/05/17 0523  03/06/17 0351  03/07/17 0446 03/07/17 0806 03/07/17 1339 03/07/17 2108 03/08/17 0320 03/08/17 0440 03/08/17 0822  NA 140   < >  --    < > 128*   < >  --  127* 131*  128* 130* 130* 129*  K 5.6*   < >  --    < > 3.8   < >  --  3.7 3.8 3.9 3.9 3.9 3.9  CL 118*   < >  --    < > 100*   < >  --  99* 99* 98* 99* 101 99*  CO2 17*   < >  --    < > 22   < >  --  _0 GLUCOSE 41*   < >  --    < > 106*   < >  --  181* 131* 109* 105* 103* 101*  BUN 65*   < >  --    < > 20   < >  --  _1 CREATININE 3.69*   < >  --    < > 1.44*   < >  --  1.33* 1.17 1.06 1.16 1.07 1.17  CALCIUM 7.5*   < >  --    < > 7.1*   < >  --  7.2* 7.4* 7.4* 7.5* 7.6* 7.7*  MG 2.1  --  1.5*  --  1.6*  --  1.8  --   --   --   --  1.5*  --   PHOS 4.5   < >  --    < > 2.1*   < >  --  2.3* 1.9* 2.5 2.4*  --  2.2*   < > = values in this interval not displayed.   Liver Function Tests: Recent Labs  Lab 03/17/2017 1827 03/04/17 0331 03/04/17 3354  03/07/17 0806 03/07/17 1339 03/07/17 2108 03/08/17 0320 03/08/17 0822  AST 82* 91* 110*  --   --   --   --   --   --   ALT 52 51 60  --   --   --   --   --   --   ALKPHOS 73 61 67  --   --   --   --   --   --   BILITOT 0.5 0.7 0.9  --   --   --   --   --   --   PROT 6.0* 5.1* 5.6*  --   --   --   --   --   --   ALBUMIN 2.6* 2.2* 2.3*   < > 1.5* 1.7* 1.7* 1.7* 1.8*   < > = values in this interval not displayed.   CBC: Recent Labs  Lab 02/28/2017 1827 03/04/17 0102 03/04/17 0331 03/04/17 5625 03/05/17 0523 03/06/17 0931 03/07/17 0446  WBC 2.2* 1.9* 1.7* 2.2* 2.7*  --  7.7  NEUTROABS 1.9  --   --   --   --   --  7.1*  HGB 6.7* 6.9* 6.6* 9.2* 8.4* 8.6* 8.4*  HCT 21.2* 20.9* 20.4* 27.5* 25.6* 25.8* 25.3*  MCV 96.4 94.0 96.7 91.8 91.5  --  90.3  PLT 77* 74* 70* 110* 82*  --  71*   Cardiac Enzymes: Recent Labs  Lab 03/23/2017 1827 03/04/17 0608  TROPONINI 0.03* 0.38*    CBG: Recent Labs  Lab 03/05/17 1447 03/05/17 1637 03/05/17 1955 03/06/17 0110 03/06/17 0413  GLUCAP 87 86 84 98 96    Recent Results (from the past 240 hour(s))  MRSA PCR Screening     Status: None   Collection Time: 02/25/2017  9:15 PM   Result Value Ref Range Status   MRSA by PCR NEGATIVE NEGATIVE Final    Comment:        The GeneXpert MRSA Assay (FDA approved for NASAL specimens only), is one component of a comprehensive MRSA colonization surveillance program. It is not intended to diagnose MRSA infection nor to guide or monitor treatment for MRSA infections.   Culture, blood (Routine X 2) w Reflex to ID Panel     Status: None   Collection Time: 03/10/2017  9:39 PM  Result Value Ref Range Status   Specimen Description BLOOD LEFT HAND  Final   Special Requests   Final    BOTTLES DRAWN AEROBIC AND ANAEROBIC Blood Culture adequate volume   Culture NO GROWTH 5 DAYS  Final   Report Status 03/08/2017 FINAL  Final  Culture, blood (Routine X 2) w Reflex to ID Panel     Status: None   Collection Time: 03/24/2017  9:43 PM  Result Value Ref Range Status   Specimen Description BLOOD RIGHT HAND  Final   Special Requests   Final    BOTTLES DRAWN AEROBIC AND ANAEROBIC Blood Culture adequate volume   Culture NO GROWTH 5 DAYS  Final   Report Status 03/08/2017 FINAL  Final     Studies: No results found.  Scheduled Meds: . chlorhexidine  gluconate (MEDLINE KIT)  15 mL Mouth Rinse BID  . mouth rinse  15 mL Mouth Rinse 10 times per day  . sodium chloride flush  3 mL Intravenous Q12H   Continuous Infusions: . morphine      Assessment/Plan:  1. Acute cardiac arrest.  2. Hypovolemic shock versus cardiogenic shock. 3. Acute hypoxic respiratory failure.  4. Acute anuric renal failure.  Patient on CRRT 5. Acute blood loss anemia with supratherapeutic INR.   6. Metabolic acidosis.   7. Clinical sepsis with pneumonia. 8. Acute encephalopathy and has not regained mental status 9. History of atrial fibrillation.  10. History of BPH.   11. Hyperlipidemia unspecified 12. Hypothyroidism unspecified . 13. Hypoglycemia.   14. Hypothermia.  Patient on bear hugger. 15. Pancytopenia probably secondary to sepsis/ DIC 16. Family  decided on comfort care measures which will be sure ordered shortly.  Case discussed with palliative care and critical care team  Code Status:     Code Status Orders  (From admission, onward)        Start     Ordered   03/04/17 0955  Do not attempt resuscitation (DNR)  Continuous    Question Answer Comment  In the event of cardiac or respiratory ARREST Do not call a "code blue"   In the event of cardiac or respiratory ARREST Do not perform Intubation, CPR, defibrillation or ACLS   In the event of cardiac or respiratory ARREST Use medication by any route, position, wound care, and other measures to relive pain and suffering. May use oxygen, suction and manual treatment of airway obstruction as needed for comfort.      03/04/17 0954    Code Status History    Date Active Date Inactive Code Status Order ID Comments User Context   03/26/2017 19:38 03/04/2017 09:54 Full Code [id number]  Hillary Bow, MD ED   01/27/2017 20:17 01/28/2017 20:34 DNR [id number]  Max Sane, MD ED   12/12/2016 15:56 12/14/2016 20:28 DNR [id number]  Vaughan Basta, MD Inpatient   09/19/2016 19:02 09/21/2016 19:55 Full Code [id number]  Bettey Costa, MD Inpatient   07/20/2016 16:50 07/21/2016 16:51 Full Code [id number]  Isaias Cowman, MD Inpatient   06/29/2016 19:37 07/01/2016 21:03 Full Code [id number]  Gladstone Lighter, MD Inpatient   09/07/2015 21:31 09/08/2015 19:58 Full Code [id number]  Quintella Baton, MD Inpatient    Advance Directive Documentation     Most Recent Value  Type of Advance Directive  Living will  Pre-existing out of facility DNR order (yellow form or pink MOST form)  No data  "MOST" Form in Place?  No data     Family Communication: Spoke with family at the bedside Disposition Plan: I do not think the patient will survive much longer after comfort care measures started  Consultants:  Critical care specialist  Nephrology  Cardiology  Palliative Care  Time spent: 20 minutes.   Lucille Witts  [employer]

## 2017-03-08 NOTE — Progress Notes (Signed)
Transitioned to comfort care-morphine drip started and ativan given. Family at bedside. Bereavement cart ordered.

## 2017-03-08 NOTE — Progress Notes (Signed)
Pt extubated per comfort care order. 

## 2017-03-08 NOTE — Progress Notes (Signed)
Nutrition Brief Note  Chart reviewed. Patient now transitioning to comfort care.   No further nutrition interventions warranted at this time. Please consult RD as needed.   Willey Blade, MS, [employer], LDN Office: [phone] Pager: [phone] After Hours/Weekend Pager: [phone]

## 2017-03-08 NOTE — Progress Notes (Deleted)
RT called to patient room due to patient self-extubating. Patient placed on 2l [place] by RN with saturations of 99-100%. Pt in no distress but groggy. Dr. Mortimer Fries notified by RN. Will continue to monitor on nasal cannula.

## 2017-03-08 NOTE — Progress Notes (Signed)
Patient appears to be resting comfortably. Surrounded by family at bedside. HR low 100's, O2 sats 70's.

## 2017-03-08 NOTE — Progress Notes (Signed)
[facility] Organ donation [phone]. Family considering comfort care today. Ref [id number] call with the time of death.

## 2017-03-08 NOTE — Progress Notes (Signed)
Daily Progress Note   Patient Name: Kyle Vasquez       Date: 03/08/2017 DOB: 11-24-1931  Age: 83 y.o. MRN#: [id number] Attending Physician: Loletha Grayer, MD Primary Care Physician: Tracie Harrier, MD Admit Date: 03/19/2017  Reason for Consultation/Follow-up: Establishing goals of care and Terminal Care  Subjective/GOC:  Patient intubated and remains critically on ventilator. Grimaces to pain but otherwise unresponsive.   Multiple family members at bedside including son, Kyle Vasquez. Palliative f/u from Elon Alas, NP. Kyle Vasquez tells me he is ready to withdraw care. I asked if he had any questions or concerns. He tells me Leonard Downing answered all his questions yesterday and he understands the process of discontinuing life prolonging interventions including ventilator and CRRT. Kyle Vasquez understands prognosis likely hours to a few days after extubation.   Morphine infusion initiated. Family asked to stay in waiting room during extubation. Morphine 72m via infusion and Ativan 158mIV given prior to extubation.  I brought family back into room when patient appeared comfortable. Respirations shallow, irregular and with periods of apnea. Oral care performed. Discussed EOL expectations with family and educated on prn medications to ensure comfort. Answered questions and concerns. Prayed with family and chaplain at bedside.    Length of Stay: 5  Current Medications: Scheduled Meds:  . chlorhexidine gluconate (MEDLINE KIT)  15 mL Mouth Rinse BID  . mouth rinse  15 mL Mouth Rinse 10 times per day  . sodium chloride flush  3 mL Intravenous Q12H    Continuous Infusions: . morphine      PRN Meds: acetaminophen **OR** acetaminophen, albuterol, bisacodyl, glycopyrrolate, LORazepam, midazolam, morphine,  ondansetron **OR** ondansetron (ZOFRAN) IV  Physical Exam  Constitutional: He appears ill.  Cardiovascular: Regular rhythm.  Pulmonary/Chest: No accessory muscle usage. No tachypnea. No respiratory distress. He has decreased breath sounds. He has rhonchi.  Terminal extubation  Musculoskeletal: He exhibits edema (generalized).  Neurological: He is unresponsive.  Skin: Skin is warm and dry.  Nursing note and vitals reviewed.          Vital Signs: BP (!) 156/85   Pulse 84   Temp 99 F (37.2 C)   Resp (!) 24   Ht 5' 6" (1.676 m)   Wt 107.3 kg (236 lb 8.9 oz)   SpO2 97%   BMI 38.18 kg/m  SpO2: SpO2: 97 %  O2 Device: O2 Device: Ventilator O2 Flow Rate:    Intake/output summary:   Intake/Output Summary (Last 24 hours) at 03/08/2017 1446 Last data filed at 03/08/2017 1200 Gross per 24 hour  Intake 1500 ml  Output 975 ml  Net 525 ml   LBM: Last BM Date: 03/07/17 Baseline Weight: Weight: 72.1 kg (159 lb) Most recent weight: Weight: 107.3 kg (236 lb 8.9 oz)       Palliative Assessment/Data: PPS 10%   Flowsheet Rows     Most Recent Value  Intake Tab  Referral Department  Hospitalist  Unit at Time of Referral  Med/Surg Unit  Palliative Care Primary Diagnosis  Cardiac  Date Notified  03/05/17  Palliative Care Type  New Palliative care  Reason for referral  Clarify Goals of Care  Date of Admission  03/23/2017  Date first seen by Palliative Care  03/07/17  # of days Palliative referral response time  2 Day(s)  # of days IP prior to Palliative referral  2  Clinical Assessment  Psychosocial & Spiritual Assessment  Palliative Care Outcomes      Patient Active Problem List   Diagnosis Date Noted  . Advanced care planning/counseling discussion   . Palliative care by specialist   . Goals of care, counseling/discussion   . Acute respiratory failure (HCC)   . Hyperkalemia   . Gastrointestinal hemorrhage   . Acute blood loss anemia   . Cardiac arrest (HCC) 02/25/2017  .  UTI (urinary tract infection) 01/27/2017  . Hematoma 12/12/2016  . Carotid atherosclerosis, bilateral 11/25/2016  . PVD (peripheral vascular disease) (HCC) 11/25/2016  . AAA (abdominal aortic aneurysm) without rupture (HCC) 11/25/2016  . Elevated troponin 09/20/2016  . Sick sinus syndrome (HCC) 07/20/2016  . Syncope 06/29/2016  . ARF (acute renal failure) (HCC) 06/29/2016  . Bradycardia 09/08/2015  . Symptomatic bradycardia 09/07/2015  . Syncope and collapse 09/07/2015  . Atrial fibrillation (HCC) 09/07/2015  . HTN (hypertension) 09/07/2015  . Physical deconditioning 09/07/2015  . H/O: CVA (cerebrovascular accident) 09/07/2015    Palliative Care Assessment & Plan   Patient Profile: 83 y.o. male  with past medical history of a. Fib, bradycardia., COPD, depression, HLD, OSA, CKD, diastolic CHF admitted on 03/08/2017 with cardiac arrest and PEA event of 6 minutes requiring 2 rounds of epinephrine and intubation. During this admission he has required ventilator support and sedation for agitation. When he is sedated his blood pressure drops and he requires pressor resuscitation. He is now on CRRT in anuric renal failure. Also with pnuemonia and clinical sepsis. When sedation is lifted he does not wake or follow commands. Palliative medicine consulted for GOC.    Assessment: Severe acute respiratory failure  Cardiac arrest Cardiogenic shock Acute on chronic renal failure Acute pulmonary edema Community acquired pneumonia Severe metabolic acidosis GI bleed  Recommendations/Plan:  DNR/DNI  Transitioned to comfort measures only. Discontinued labs/medications/interventions not aimed at comfort. Terminally extubated.   Symptom management  Morphine 5mg/hr continuous infusion  RN may bolus via infusion 2-4mg q15min prn pain/dyspnea/air hunger  Ativan 1mg q1h IV prn anxiety  Robinul 0.2mg IV q4h prn secretions  Frequent oral care.  Chaplain services as needed.   Prognosis likely  hours. Anticipate hospital death.   Goals of Care and Additional Recommendations:  Limitations on Scope of Treatment: Full Comfort Care  Code Status: DNR/DNI   Code Status Orders  (From admission, onward)        Start     Ordered   03/08/17 1421    DNR (Do not attempt resuscitation)  Continuous    Question Answer Comment  In the event of cardiac or respiratory ARREST Do not call a "code blue"   In the event of cardiac or respiratory ARREST Do not perform Intubation, CPR, defibrillation or ACLS   In the event of cardiac or respiratory ARREST Use medication by any route, position, wound care, and other measures to relive pain and suffering. May use oxygen, suction and manual treatment of airway obstruction as needed for comfort.      03/08/17 1423    Code Status History    Date Active Date Inactive Code Status Order ID Comments User Context   03/04/2017 09:54 03/08/2017 14:23 DNR [id number]  Hermelinda Dellen, DO Inpatient   03/21/2017 19:38 03/04/2017 09:54 Full Code [id number]  Hillary Bow, MD ED   01/27/2017 20:17 01/28/2017 20:34 DNR [id number]  Max Sane, MD ED   12/12/2016 15:56 12/14/2016 20:28 DNR [id number]  Vaughan Basta, MD Inpatient   09/19/2016 19:02 09/21/2016 19:55 Full Code [id number]  Bettey Costa, MD Inpatient   07/20/2016 16:50 07/21/2016 16:51 Full Code [id number]  Isaias Cowman, MD Inpatient   06/29/2016 19:37 07/01/2016 21:03 Full Code [id number]  Gladstone Lighter, MD Inpatient   09/07/2015 21:31 09/08/2015 19:58 Full Code [id number]  Quintella Baton, MD Inpatient    Advance Directive Documentation     Most Recent Value  Type of Advance Directive  Living will  Pre-existing out of facility DNR order (yellow form or pink MOST form)  No data  "MOST" Form in Place?  No data       Prognosis:   Hours - Days  Discharge Planning:  Anticipated Hospital Death  Care plan was discussed with son and family members at beside, [facility] NP, Dr. Mortimer Fries, Dr. Leslye Peer,  RN  Thank you for allowing the Palliative Medicine Team to assist in the care of this patient.   Time In: 1400- 1500- Time Out: 1415 1600 Total Time 24mn Prolonged Time Billed  yes      Greater than 50%  of this time was spent counseling and coordinating care related to the above assessment and plan.  MIhor Dow FNP-C Palliative Medicine Team  Phone: [phone]: [phone] Please contact Palliative Medicine Team phone at [phone] questions and concerns.

## 2017-03-15 ENCOUNTER — Telehealth: Payer: Self-pay

## 2017-03-15 NOTE — Telephone Encounter (Signed)
Received death certificate placed in nurse box

## 2017-03-15 NOTE — Telephone Encounter (Signed)
Death certificate placed in DK folder. 

## 2017-03-16 NOTE — Telephone Encounter (Signed)
[facility] informed death cert is ready for pick up. Nothing further needed.

## 2017-03-28 NOTE — Progress Notes (Signed)
Patient deceased at 17. Family notified. [place] NP notified.

## 2017-03-28 NOTE — Death Summary Note (Signed)
DEATH SUMMARY   Patient Details  Name: Kyle Vasquez MRN: [id number] DOB: 1932-01-24  Admission/Discharge Information   Admit Date:  2017/03/28  Date of Death: Date of Death: (P) April 03, 2017  Time of Death: Time of Death: (P) 0502  Length of Stay: 6  Referring Physician: Tracie Harrier, MD   Reason(s) for Hospitalization  Cardiac Arrest  Diagnoses  Preliminary cause of death: Cardiac Arrest Secondary Diagnoses (including complications and co-morbidities):  Active Problems:   Cardiac arrest (HCC)   Acute respiratory failure (HCC)   Hyperkalemia   Gastrointestinal hemorrhage   Acute blood loss anemia   Advanced care planning/counseling discussion   Palliative care by specialist   Goals of care, counseling/discussion   Cardiogenic shock ([place])   Shortness of breath   Terminal care   Brief Hospital Course (including significant findings, care, treatment, and services provided and events leading to death)  Fletcher Ostermiller was an 84 y.o. year old male who had multiple co-morbidities that includes diastolic congestive heart failure, atrial fibrillation .  Patient complained of some shortness of breath to his family and then collapsed.  Patient had cardiac arrest and was resuscitated  With 2 rounds of epinephrine.  He was severely hypothermic and was found to have anion gap metabolic acidosis. His coarse was complicated by severe CAP,blood loss anemia, cardiogenic/hypovolemic shock, Renal failure , thrombocytopenia.  He was made a DNR and later family decided to withdraw care as the prognosis was very poor .  Palliative care was consulted. Patient was terminally extubated.  Patient passed away on 05:02 at 04-03-17.     Pertinent Labs and Studies  Significant Diagnostic Studies Dg Abdomen 1 View  Result Date: 03/28/17 CLINICAL DATA:  Encounter for orogastric tube placement. EXAM: ABDOMEN - 1 VIEW COMPARISON:  None. FINDINGS: The tip and side port of a gastric tube are seen in the  expected location of the stomach. The included heart is enlarged. Pleural thickening and/or small loculated fluid is noted along the periphery the right hemithorax. Partially visualized distal aortic stent graft. Lumbar fusion hardware noted at L2-3. No radio-opaque calculi or other significant radiographic abnormality are seen. IMPRESSION: 1. Gastric tube in the expected location of the stomach. 2. Cardiomegaly with pleural thickening versus small loculated pleural fluid. Electronically Signed   By: Ashley Royalty M.D.   On: 2017-03-28 19:04   Dg Chest Port 1 View  Result Date: 03/05/2017 CLINICAL DATA:  On vent; hx/o a-fib, CKD, COPD, CAD, and stroke; former smoker EXAM: PORTABLE CHEST 1 VIEW COMPARISON:  03/04/2017 FINDINGS: Bilateral interstitial airspace lung opacities, greater on the right, are without significant change from the previous day's study. Bilateral pleural effusions are stable. No new lung abnormalities. No pneumothorax. Endotracheal tube and left internal jugular dual-lumen central venous line are stable. IMPRESSION: 1. No significant change from the previous day's study. 2. Persistent, right greater than left lung opacities and bilateral pleural effusions. 3. Support apparatus stable and well positioned. Electronically Signed   By: Lajean Manes M.D.   On: 03/05/2017 10:15   Dg Chest Port 1 View  Result Date: 03/04/2017 CLINICAL DATA:  Central line placement EXAM: PORTABLE CHEST - 1 VIEW COMPARISON:  Earlier film of the same day FINDINGS: Left IJ HD catheter is been placed, tip near the junction of the left innominate vein to the SVC. No pneumothorax. Endotracheal tube and nasogastric tube remain in place. Left subclavian transvenous pacemaker incompletely visualized. Cervical fixation hardware noted. Coarse moderate interstitial airspace opacities throughout the visualized lungs. The  bases are excluded. Visualized bones unremarkable. IMPRESSION: 1. Left IJ central line to the junction of  left innominate vein to the SVC. No pneumothorax. Electronically Signed   By: Lucrezia Europe M.D.   On: 03/04/2017 10:12   Dg Chest Port 1 View  Result Date: 03/04/2017 CLINICAL DATA:  83 year old male with acute respiratory failure. EXAM: PORTABLE CHEST 1 VIEW COMPARISON:  Chest radiograph dated 03/11/2017 FINDINGS: The endotracheal tube remains above the carina in stable positioning. There is right-sided pleural effusion and right lung base atelectasis/ infiltrate. Diffuse interstitial coarsening and left lung base atelectatic changes. Pleural thickening or loculated fluid in the right upper lobe. Stable cardiac silhouette. Atherosclerotic calcification of the aortic arch. Left pectoral pacemaker device. IMPRESSION: Right-sided pleural effusion and right lung base atelectasis/ infiltrate. Overall no significant interval change in the appearance of the lungs compared to the earlier radiograph. Electronically Signed   By: Anner Crete M.D.   On: 03/04/2017 07:17   Dg Chest Portable 1 View  Result Date: 03/18/2017 CLINICAL DATA:  Patient found on floor by family in with agonal breathing. EXAM: PORTABLE CHEST 1 VIEW COMPARISON:  02/01/2017 FINDINGS: Cardiomegaly with aortic atherosclerosis. Endotracheal tube is noted 4.3 cm above the carina in satisfactory position. Pulmonary vascular redistribution is seen consistent with pulmonary edema. Superimposed airspace opacities in the periphery of the right upper and central left upper lobes cannot concomitant pneumonia. No left effusion. The right lateral costophrenic angle is included as part of the abdomen radiographs acquired sequentially with this exam. There does appear to be pleural thickening or a small fluid loculation along the periphery of the right hemithorax. Spinal fusion hardware noted of the included lower cervical spine. External defibrillator paddles project over the cardiac silhouette. No acute osseous abnormality. IMPRESSION: 1. Cardiomegaly with  aortic atherosclerosis. Pleural thickening versus small amount of loculated fluid along the periphery of the visualized right hemithorax. 2. Satisfactory endotracheal tube position. 3. Diffuse pulmonary vascular redistribution consistent with pulmonary edema. 4. Superimposed pneumonia in the upper lobes is not excluded. Electronically Signed   By: Ashley Royalty M.D.   On: 03/20/2017 18:53    Microbiology Recent Results (from the past 240 hour(s))  MRSA PCR Screening     Status: None   Collection Time: 02/25/2017  9:15 PM  Result Value Ref Range Status   MRSA by PCR NEGATIVE NEGATIVE Final    Comment:        The GeneXpert MRSA Assay (FDA approved for NASAL specimens only), is one component of a comprehensive MRSA colonization surveillance program. It is not intended to diagnose MRSA infection nor to guide or monitor treatment for MRSA infections.   Culture, blood (Routine X 2) w Reflex to ID Panel     Status: None   Collection Time: 03/24/2017  9:39 PM  Result Value Ref Range Status   Specimen Description BLOOD LEFT HAND  Final   Special Requests   Final    BOTTLES DRAWN AEROBIC AND ANAEROBIC Blood Culture adequate volume   Culture NO GROWTH 5 DAYS  Final   Report Status 03/08/2017 FINAL  Final  Culture, blood (Routine X 2) w Reflex to ID Panel     Status: None   Collection Time: 02/26/2017  9:43 PM  Result Value Ref Range Status   Specimen Description BLOOD RIGHT HAND  Final   Special Requests   Final    BOTTLES DRAWN AEROBIC AND ANAEROBIC Blood Culture adequate volume   Culture NO GROWTH 5 DAYS  Final  Report Status 03/08/2017 FINAL  Final    Lab Basic Metabolic Panel: Recent Labs  Lab 03/04/17 0277  03/05/17 0523  03/06/17 0351  03/07/17 0446 03/07/17 0806 03/07/17 1339 03/07/17 2108 03/08/17 0320 03/08/17 0440 03/08/17 0822  NA 140   < >  --    < > 128*   < >  --  127* 131* 128* 130* 130* 129*  K 5.6*   < >  --    < > 3.8   < >  --  3.7 3.8 3.9 3.9 3.9 3.9  CL 118*    < >  --    < > 100*   < >  --  99* 99* 98* 99* 101 99*  CO2 17*   < >  --    < > 22   < >  --  23 26 23 26 25 26   GLUCOSE 41*   < >  --    < > 106*   < >  --  181* 131* 109* 105* 103* 101*  BUN 65*   < >  --    < > 20   < >  --  13 14 13 13 12 12   CREATININE 3.69*   < >  --    < > 1.44*   < >  --  1.33* 1.17 1.06 1.16 1.07 1.17  CALCIUM 7.5*   < >  --    < > 7.1*   < >  --  7.2* 7.4* 7.4* 7.5* 7.6* 7.7*  MG 2.1  --  1.5*  --  1.6*  --  1.8  --   --   --   --  1.5*  --   PHOS 4.5   < >  --    < > 2.1*   < >  --  2.3* 1.9* 2.5 2.4*  --  2.2*   < > = values in this interval not displayed.   Liver Function Tests: Recent Labs  Lab 03/17/2017 1827 03/04/17 0331 03/04/17 4128  03/07/17 0806 03/07/17 1339 03/07/17 2108 03/08/17 0320 03/08/17 0822  AST 82* 91* 110*  --   --   --   --   --   --   ALT 52 51 60  --   --   --   --   --   --   ALKPHOS 73 61 67  --   --   --   --   --   --   BILITOT 0.5 0.7 0.9  --   --   --   --   --   --   PROT 6.0* 5.1* 5.6*  --   --   --   --   --   --   ALBUMIN 2.6* 2.2* 2.3*   < > 1.5* 1.7* 1.7* 1.7* 1.8*   < > = values in this interval not displayed.   No results for input(s): LIPASE, AMYLASE in the last 168 hours. No results for input(s): AMMONIA in the last 168 hours. CBC: Recent Labs  Lab 03/27/2017 1827 03/04/17 0102 03/04/17 0331 03/04/17 7867 03/05/17 0523 03/06/17 0931 03/07/17 0446  WBC 2.2* 1.9* 1.7* 2.2* 2.7*  --  7.7  NEUTROABS 1.9  --   --   --   --   --  7.1*  HGB 6.7* 6.9* 6.6* 9.2* 8.4* 8.6* 8.4*  HCT 21.2* 20.9* 20.4* 27.5* 25.6* 25.8* 25.3*  MCV 96.4 94.0 96.7 91.8 91.5  --  90.3  PLT 77* 74* 70* 110* 82*  --  71*   Cardiac Enzymes: Recent Labs  Lab 03/27/2017 1827 03/04/17 0608  TROPONINI 0.03* 0.38*   Sepsis Labs: Recent Labs  Lab 03/14/2017 2138  03/04/17 0331 03/04/17 0608 03/05/17 0523 03/07/17 0446  PROCALCITON 0.46  --  0.45  --  8.53  --   WBC  --    < > 1.7* 2.2* 2.7* 7.7  LATICACIDVEN  --   --  2.9* 1.9  --    --    < > = values in this interval not displayed.    Procedures/Operations  12/8 Central Line>> 12/8 Arterial Line>> 12/7 ET tube >>12/13    Ajane Novella S Arianah Torgeson 06-Apr-2017, 5:44 AM

## 2017-03-28 DEATH — deceased

## 2017-12-01 ENCOUNTER — Other Ambulatory Visit (INDEPENDENT_AMBULATORY_CARE_PROVIDER_SITE_OTHER): Payer: Self-pay

## 2017-12-01 ENCOUNTER — Ambulatory Visit (INDEPENDENT_AMBULATORY_CARE_PROVIDER_SITE_OTHER): Payer: Self-pay | Admitting: Vascular Surgery

## 2018-12-03 ENCOUNTER — Encounter (INDEPENDENT_AMBULATORY_CARE_PROVIDER_SITE_OTHER): Payer: Self-pay

## 2018-12-03 ENCOUNTER — Ambulatory Visit (INDEPENDENT_AMBULATORY_CARE_PROVIDER_SITE_OTHER): Payer: Self-pay | Admitting: Vascular Surgery

## 2018-12-04 IMAGING — CR DG CHEST 2V
2 series · 3 of 3 positions shown · non-contrast
Comparison: Chest x-ray of July 20, 2016

CLINICAL DATA: Generalized body pain. No current complaints.
History of atrial fibrillation, coronary artery disease, previous
CVA, COPD.

EXAM:
CHEST  2 VIEW

[chest lat]
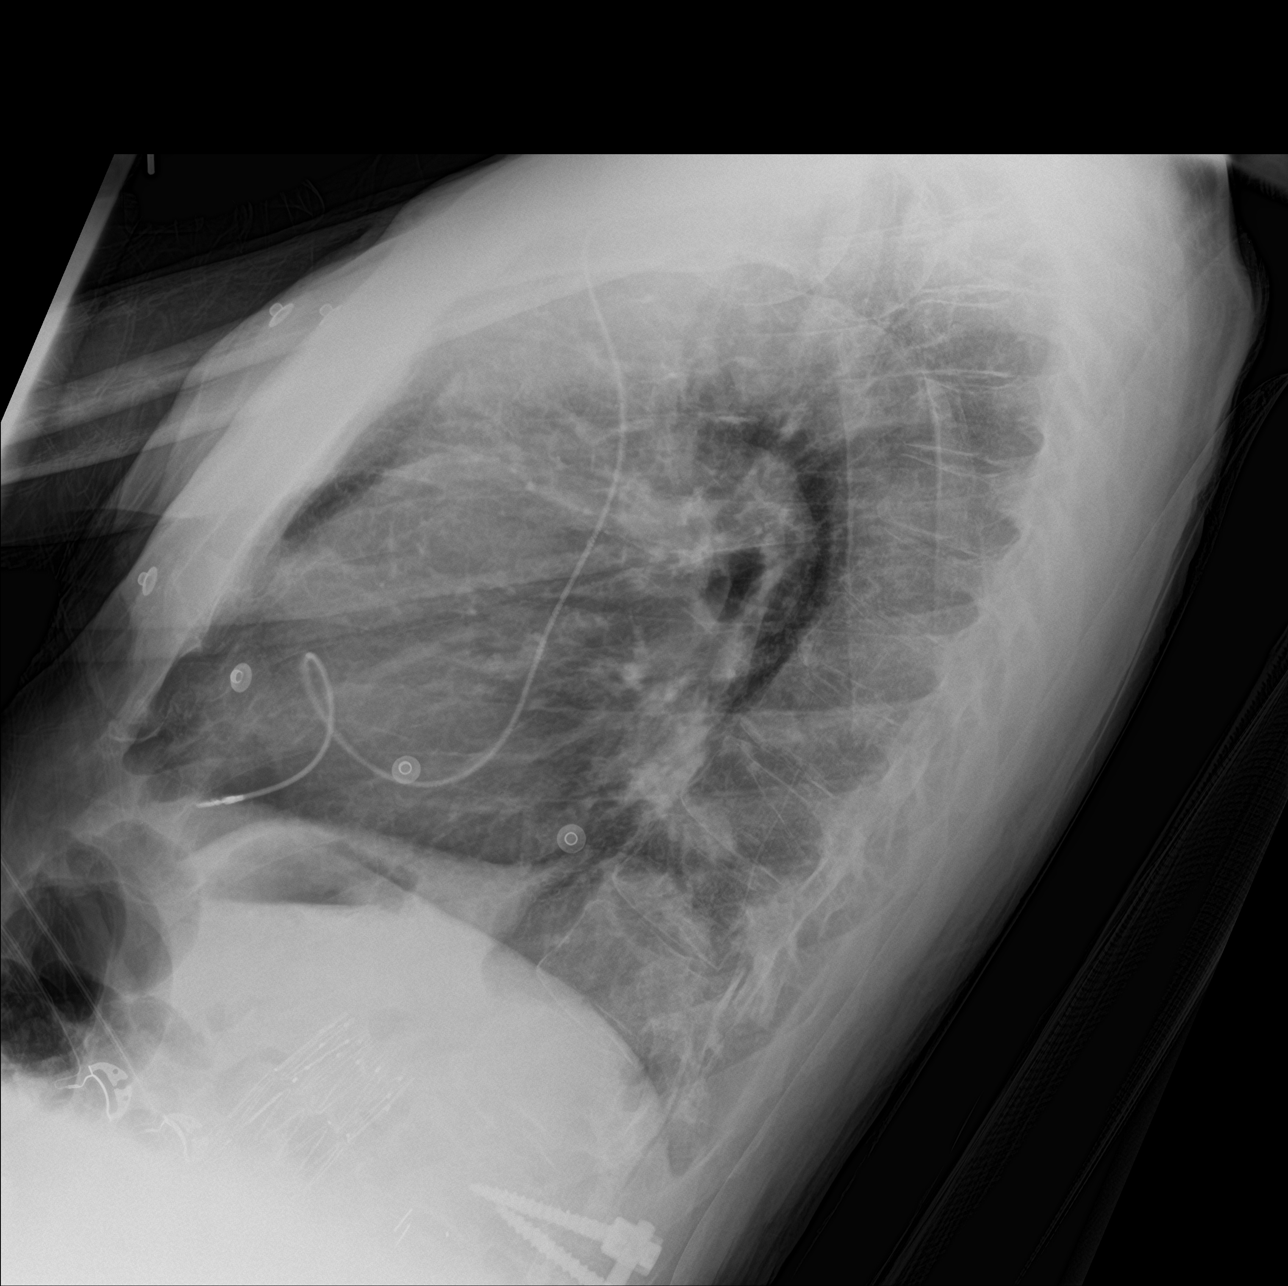

[Series 3: chest ap · 0.14mm/px · 2 of 2 slices shown]
[im 1/2]
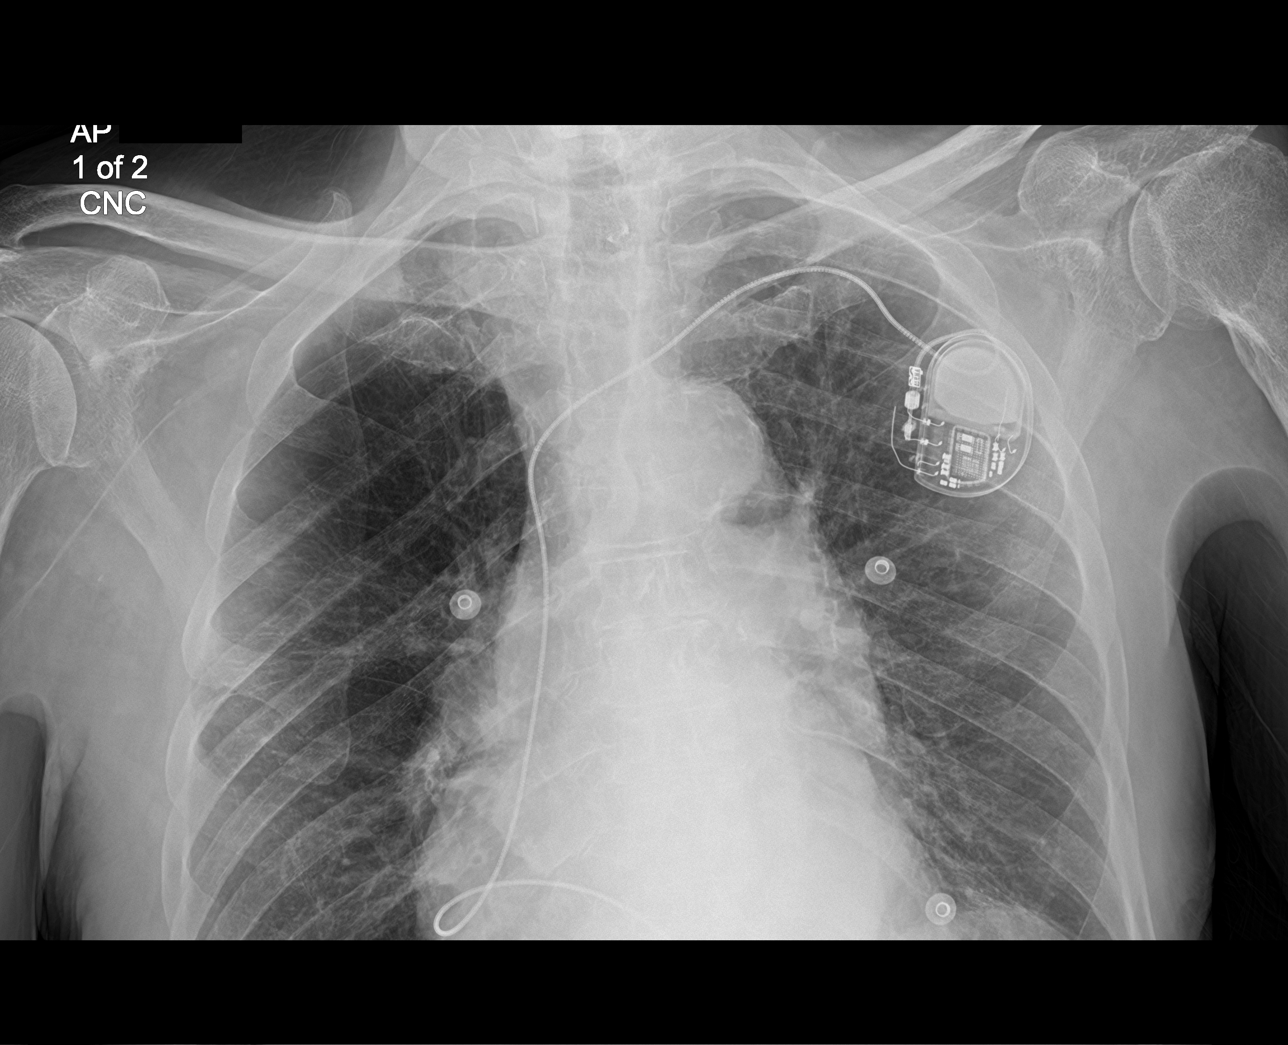
[im 2/2]
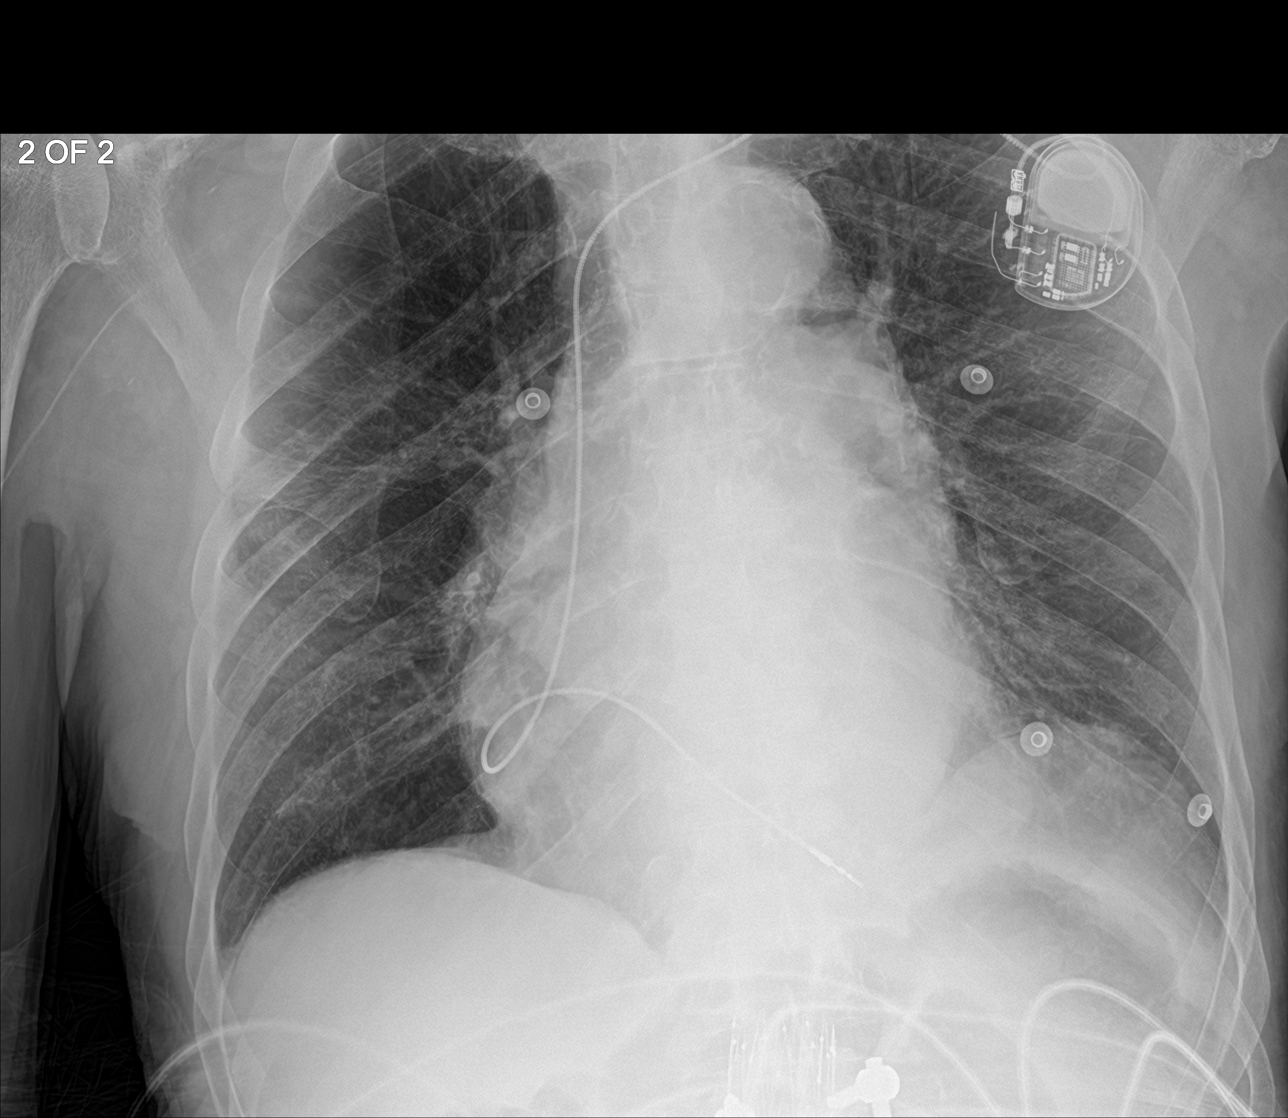

[3 of 3 positions shown; findings below may reference images not displayed]

FINDINGS: The lungs are mildly hyperinflated but clear. The heart is mildly
enlarged but stable. The pulmonary vascularity is normal. There is
calcification in the wall of the aortic arch. The ICD is in stable
position. There is a stent in the upper abdominal aorta.
IMPRESSION: No active cardiopulmonary disease.

## 2018-12-05 IMAGING — CR DG SHOULDER 2+V*L*
3 series · 3 of 3 positions shown · non-contrast
Comparison: Chest radiographs 07/20/2016 and 09/19/2016. Left
shoulder radiographs 02/06/2014 unavailable.

CLINICAL DATA: Left shoulder pain with limited range of motion for
a week or 2. Patient denies injury. Initial encounter.

EXAM:
LEFT SHOULDER - 2+ VIEW

[shoulder grashey (1 of 2)]
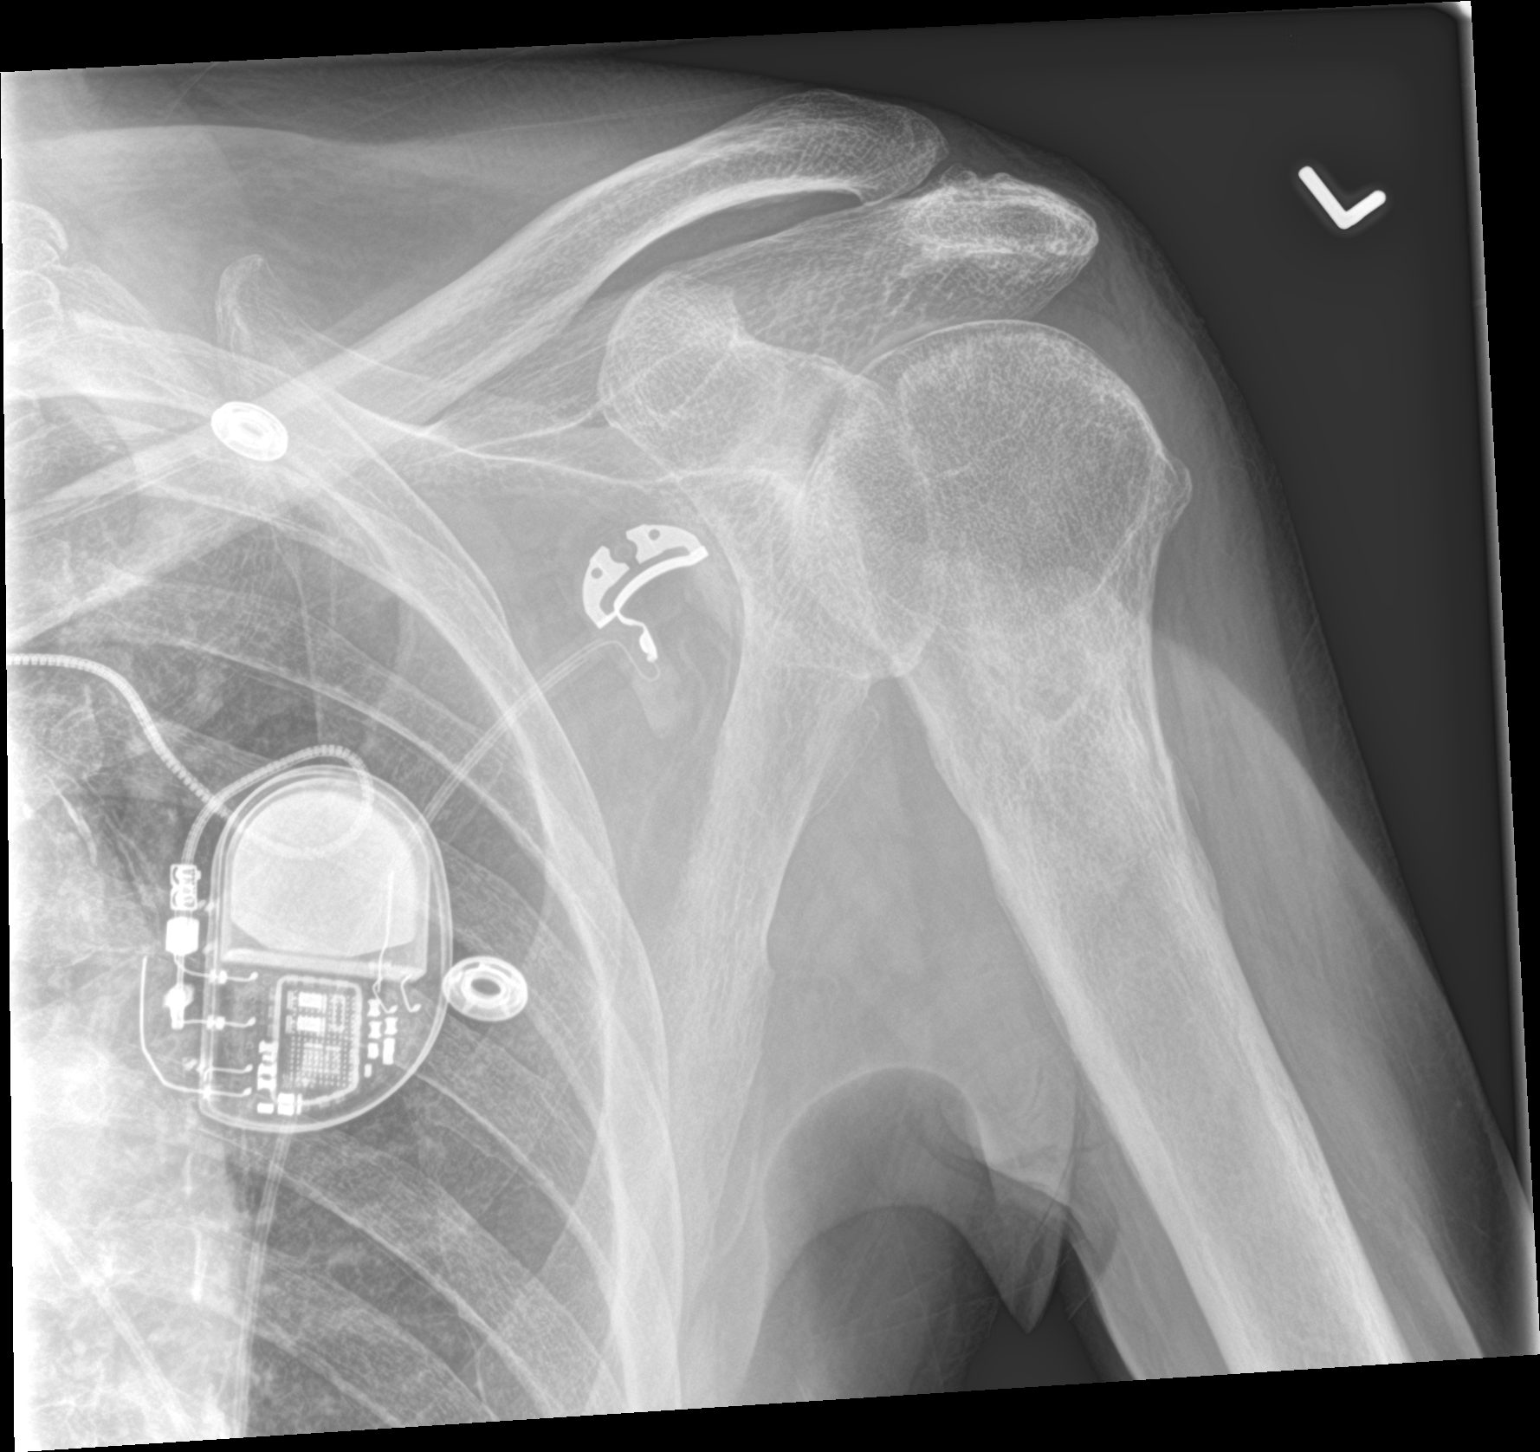

[shoulder y view]
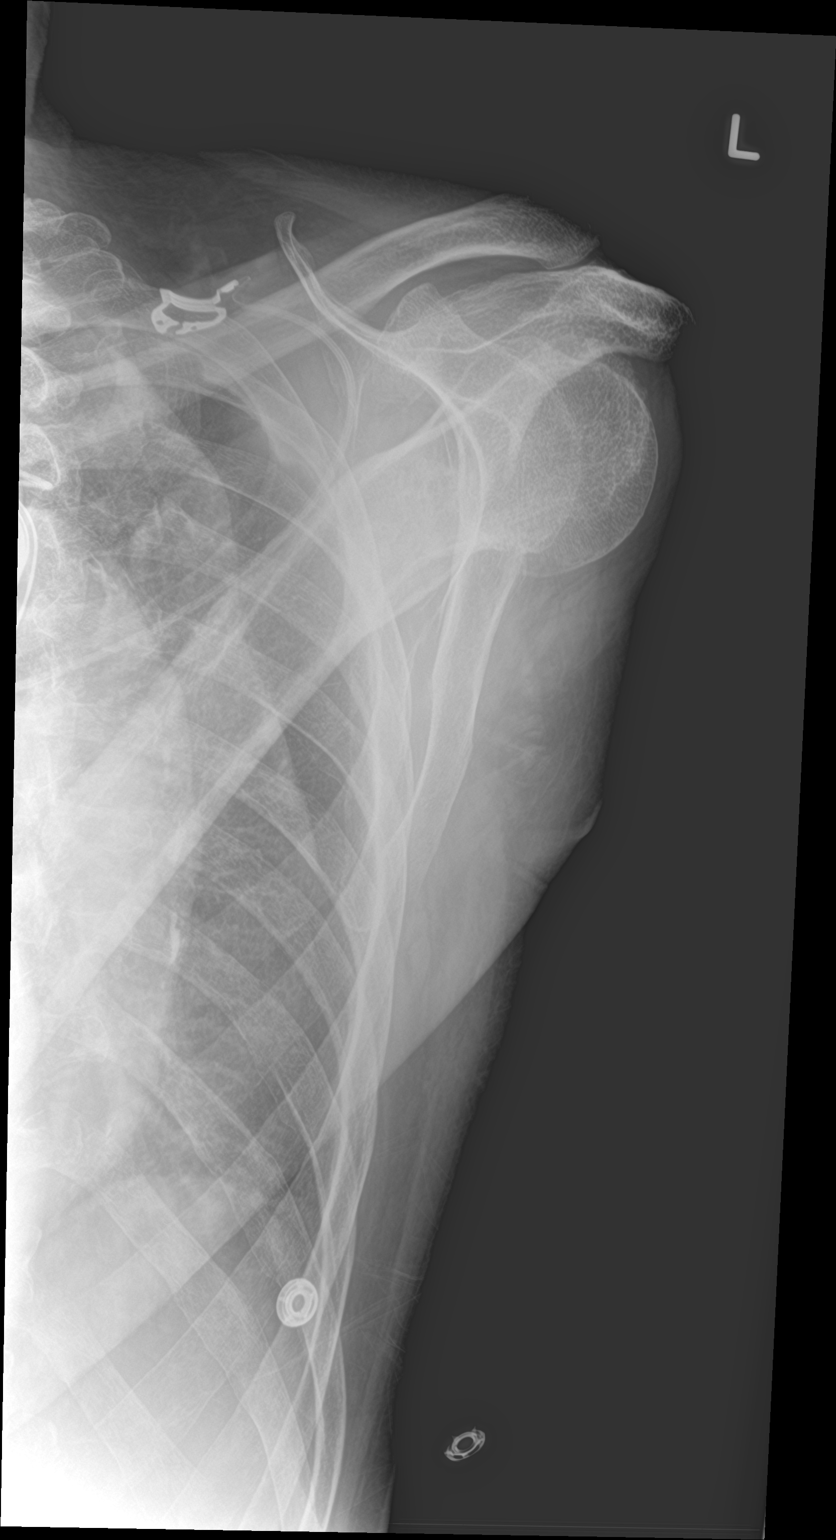

[shoulder grashey (2 of 2)]
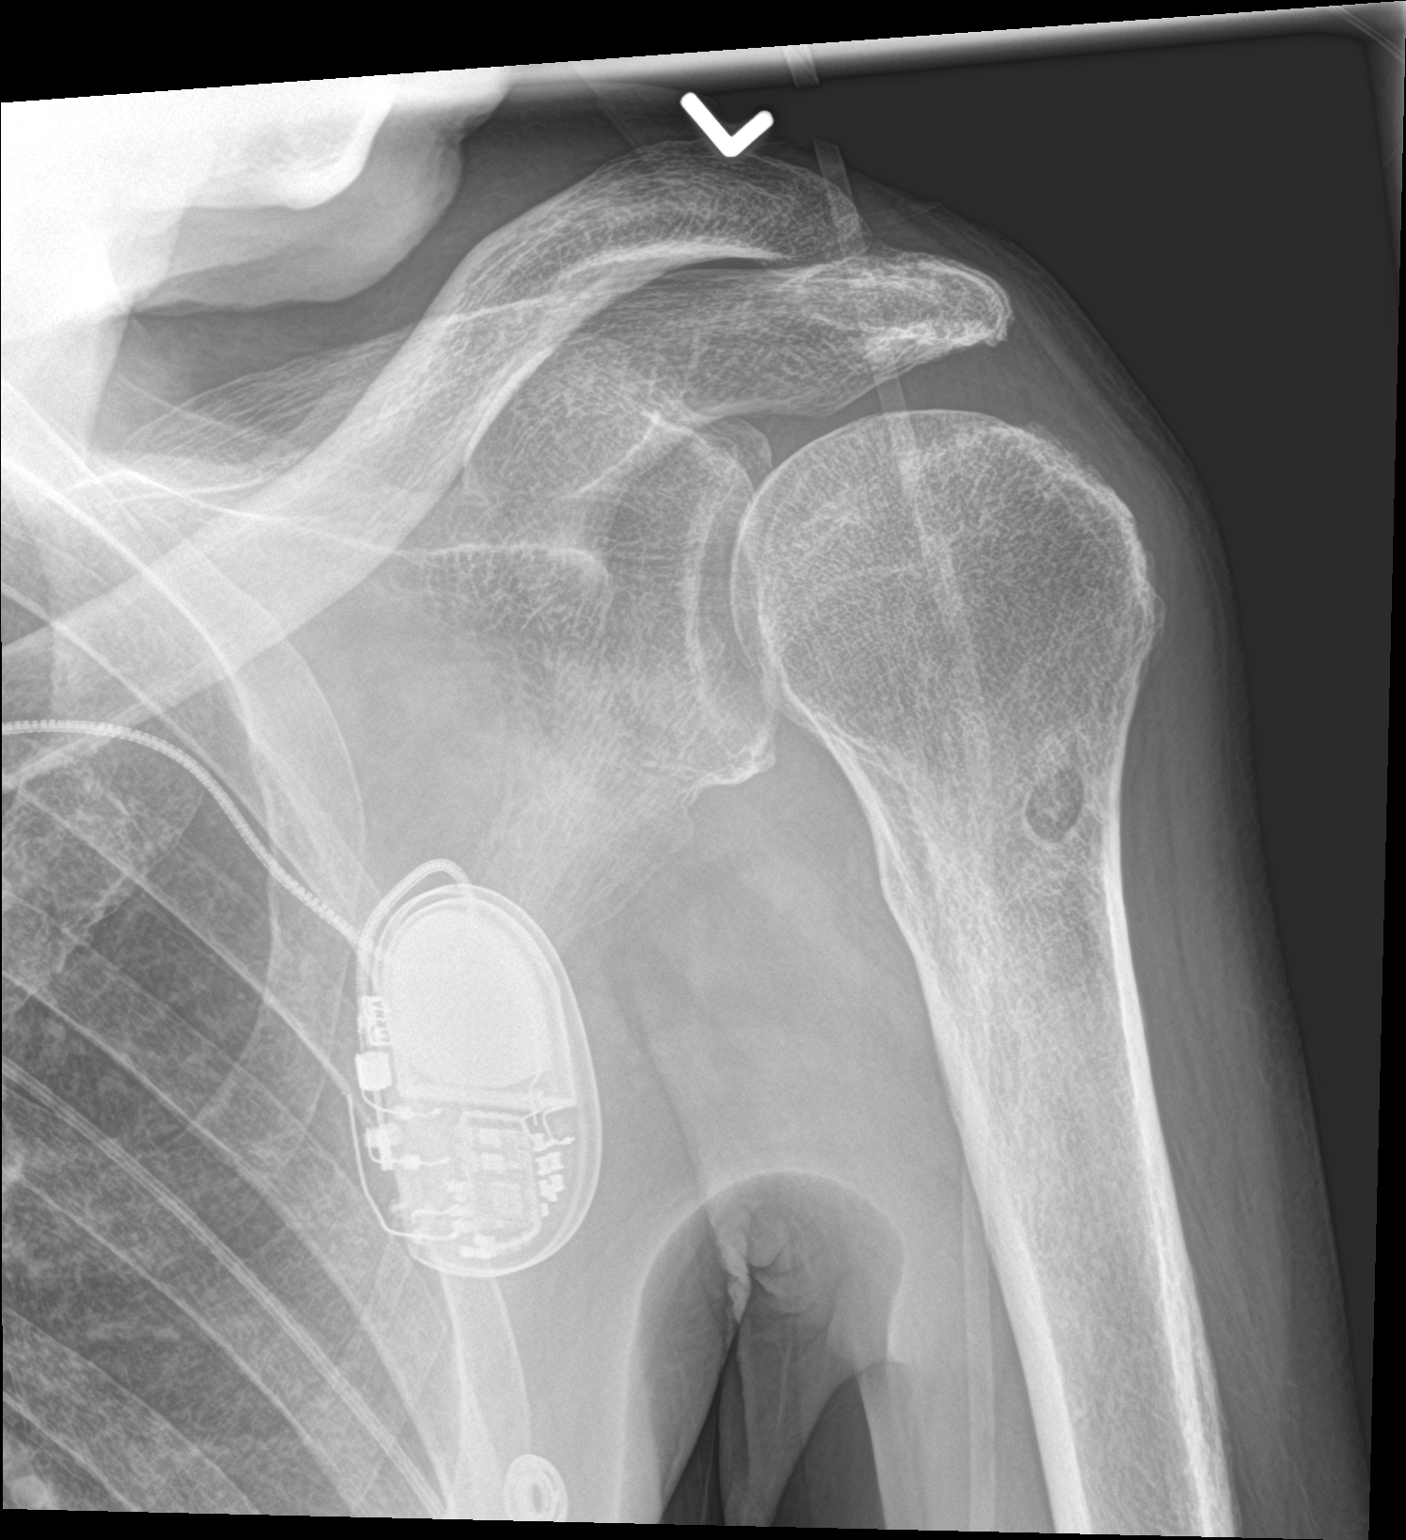

[3 of 3 positions shown; findings below may reference images not displayed]

FINDINGS: Posttraumatic deformity of the proximal left humerus is noted with
sclerosis and mild apex anterior angulation. There is a small
well-circumscribed cortical lucency. These findings appear unchanged
from recent chest radiographs. No evidence of acute fracture,
dislocation or aggressive lesion. There are mild acromioclavicular
and glenohumeral degenerative changes. Left subclavian pacemaker
noted.
IMPRESSION: No acute findings. Stable posttraumatic deformity of the proximal
left humerus.
